# Patient Record
Sex: Female | Born: 1965 | Race: White | Hispanic: No | Marital: Married | Smoking: Never smoker
Health system: Southern US, Community
[De-identification: ages and names within clinical notes are randomized; demographics above are authoritative.]

## PROBLEM LIST (undated history)

## (undated) DIAGNOSIS — F419 Anxiety disorder, unspecified: Secondary | ICD-10-CM

## (undated) HISTORY — PX: OTHER SURGICAL HISTORY: SHX169

## (undated) HISTORY — DX: Anxiety disorder, unspecified: F41.9

---

## 1989-04-14 HISTORY — PX: KNEE ARTHROSCOPY: SUR90

## 2005-04-14 HISTORY — PX: TUBAL LIGATION: SHX77

## 2006-06-15 ENCOUNTER — Ambulatory Visit: Payer: Self-pay | Admitting: Obstetrics and Gynecology

## 2007-07-29 ENCOUNTER — Ambulatory Visit: Payer: Self-pay | Admitting: Obstetrics and Gynecology

## 2007-08-18 ENCOUNTER — Emergency Department: Payer: Self-pay | Admitting: Emergency Medicine

## 2008-08-01 ENCOUNTER — Ambulatory Visit: Payer: Self-pay | Admitting: Sports Medicine

## 2008-08-29 ENCOUNTER — Ambulatory Visit: Payer: Self-pay | Admitting: Obstetrics and Gynecology

## 2009-08-31 ENCOUNTER — Ambulatory Visit: Payer: Self-pay | Admitting: Obstetrics and Gynecology

## 2010-09-02 ENCOUNTER — Ambulatory Visit: Payer: Self-pay | Admitting: Obstetrics and Gynecology

## 2011-02-25 ENCOUNTER — Ambulatory Visit: Payer: Self-pay | Admitting: Internal Medicine

## 2011-07-07 DIAGNOSIS — J069 Acute upper respiratory infection, unspecified: Secondary | ICD-10-CM | POA: Insufficient documentation

## 2011-09-03 ENCOUNTER — Ambulatory Visit: Payer: Self-pay | Admitting: Obstetrics and Gynecology

## 2012-07-13 LAB — HM PAP SMEAR

## 2012-08-12 LAB — HM MAMMOGRAPHY

## 2012-09-03 ENCOUNTER — Ambulatory Visit: Payer: Self-pay | Admitting: Obstetrics and Gynecology

## 2013-02-14 ENCOUNTER — Encounter: Payer: Self-pay | Admitting: Adult Health

## 2013-02-14 ENCOUNTER — Ambulatory Visit (INDEPENDENT_AMBULATORY_CARE_PROVIDER_SITE_OTHER): Payer: BC Managed Care – PPO | Admitting: Adult Health

## 2013-02-14 VITALS — BP 122/76 | HR 84 | Temp 98.4°F | Resp 12 | Ht 63.5 in | Wt 138.0 lb

## 2013-02-14 DIAGNOSIS — R7989 Other specified abnormal findings of blood chemistry: Secondary | ICD-10-CM | POA: Insufficient documentation

## 2013-02-14 DIAGNOSIS — L719 Rosacea, unspecified: Secondary | ICD-10-CM | POA: Insufficient documentation

## 2013-02-14 DIAGNOSIS — Z0000 Encounter for general adult medical examination without abnormal findings: Secondary | ICD-10-CM

## 2013-02-14 DIAGNOSIS — Z23 Encounter for immunization: Secondary | ICD-10-CM

## 2013-02-14 DIAGNOSIS — R946 Abnormal results of thyroid function studies: Secondary | ICD-10-CM

## 2013-02-14 DIAGNOSIS — G479 Sleep disorder, unspecified: Secondary | ICD-10-CM | POA: Insufficient documentation

## 2013-02-14 MED ORDER — METRONIDAZOLE 0.75 % EX LOTN: TOPICAL_LOTION | CUTANEOUS | Status: DC

## 2013-02-14 NOTE — Assessment & Plan Note (Signed)
Recheck TSH. Check T3 and T4

## 2013-02-14 NOTE — Patient Instructions (Signed)
   Thank you for choosing [facility] for your health care needs.  Please have your labs drawn at your earliest convenience. You will need to be fasting for 8 hours.  The results will be available through MyChart for your convenience. Please remember to activate this. The activation code is located at the end of this form.  Start metronidazole lotion to your face 1-2 times daily for your rosacea symptoms.  You received your flu shot today.  For sleep, try melatonin 2-3 mg at bedtime. May increase dose gradually. This is sold over the counter.

## 2013-02-14 NOTE — Assessment & Plan Note (Signed)
Normal physical exam. Patient is up to date on mammography and Pap. She has these done with Dr. Logan Bores. Check labs: CBC with differential, lipids, comprehensive metabolic panel, TSH, T3, T4, vitamin D, B12

## 2013-02-14 NOTE — Assessment & Plan Note (Signed)
Metronidazole topical lotion 0.75%. Apply daily to twice a day to affected area.

## 2013-02-14 NOTE — Assessment & Plan Note (Signed)
Try melatonin 2-3 mg at bedtime. This is sold over-the-counter. Increased dose as needed to 5-6 mg at bedtime

## 2013-02-14 NOTE — Progress Notes (Signed)
Subjective:    Patient ID: Cynthia Ruiz, female    DOB: 08-13-1965, 47 y.o.   MRN: [id number]  HPI  Patient is a pleasant 47 y/o female who presents to clinic to establish. She was followed by Dr. Alison Murray who has moved out of the area. Patient reports that she is healthy. She reports that her last TSH was minimally elevated. They prescribed thyroid medication but did not tolerate this. She is not certain why additional testing was not done and she was just automatically started on medication. She would like this recheck. She is experiencing some difficulty sleeping. She is able to get to sleep but then wakes up around 3-4 am and cannot return to sleep. Tessa also reports some facial flushing after drinking wine or caffeine. Up to date on her mammogram (08/2012) which she reports was normal. Also UTD on PAP (07/2011) also normal. She is followed by Dr. Logan Bores for her GYN needs.   History reviewed. No pertinent past medical history.   Past Surgical History  Procedure Laterality Date  . Knee arthroscopy Right 1991  . Cesarean section  1991, 2002, 2007  . Pyloric stenosis surgery       Family History  Problem Relation Age of Onset  . Arthritis Mother 94    rheumatoid arthritis - died 18  . Hypertension Father   . Glaucoma Father   . Multiple sclerosis Sister   . Cancer Maternal Grandmother     bile duct  . Cancer Paternal Grandmother     unsure what type  . Diabetes Paternal Grandfather   . Down syndrome Daughter      History   Social History  . Marital Status: Married    Spouse Name: Sharion Grieves    Number of Children: 3  . Years of Education: 17   Occupational History  . Part time substitute teacher     [employer]   Social History Main Topics  . Smoking status: Never Smoker   . Smokeless tobacco: Never Used  . Alcohol Use: Yes     Comment: Occasionally   . Drug Use: No  . Sexual Activity: Yes    Partners: Male   Other Topics Concern  . Not  on file   Social History Narrative   Jeanann grew up in [place]. She attended [employer] in [place] and obtained her Bachelors degree in Sociology. 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 She currently lives in [place] with her husband, [name], and 2 of their 3 children. She has 1 son who is in the [employer] stationed in [place]. 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 Her main focus is her children. She enjoys going out with her husband and socializing with friends. She also does a lot of volunteer work in [employer]. 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     Review of Systems  Constitutional: Negative.   HENT: Negative.   Eyes: Negative.   Respiratory: Negative.   Cardiovascular: Negative.   Gastrointestinal: Negative.   Endocrine: Negative.   Genitourinary: Negative.   Musculoskeletal: Negative.   Skin:       Facial flushing after drinking wine or caffeine. Redness on her nose and cheeks.  Allergic/Immunologic: Negative.   Neurological: Negative.   Hematological: Negative.   Psychiatric/Behavioral: Positive for sleep disturbance. Negative for suicidal ideas, behavioral problems, confusion, self-injury, decreased concentration and agitation. The  patient is not nervous/anxious.        Objective:   Physical Exam  Constitutional: She is oriented to person, place, and time. She appears well-developed and well-nourished. No distress.  HENT:  Head: Normocephalic and atraumatic.  Right Ear: External ear normal.  Left Ear: External ear normal.  Nose: Nose normal.  Mouth/Throat: Oropharynx is clear and moist.  Eyes: Conjunctivae and EOM are normal. Pupils are equal, round, and reactive to light.  Neck: Normal range of motion. Neck supple. No tracheal deviation present. No thyromegaly present.  Cardiovascular: Normal rate, regular rhythm,  normal heart sounds and intact distal pulses.  Exam reveals no gallop and no friction rub.   No murmur heard. Pulmonary/Chest: Effort normal and breath sounds normal. No respiratory distress. She has no wheezes. She has no rales.  Abdominal: Soft. Bowel sounds are normal. She exhibits no distension and no mass. There is no tenderness. There is no rebound and no guarding.  Musculoskeletal: Normal range of motion. She exhibits no edema and no tenderness.  Lymphadenopathy:    She has no cervical adenopathy.  Neurological: She is alert and oriented to person, place, and time. She has normal reflexes. No cranial nerve deficit. Coordination normal.  Skin: Skin is warm and dry.  Psychiatric: She has a normal mood and affect. Her behavior is normal. Judgment and thought content normal.          Assessment & Plan:

## 2013-02-18 ENCOUNTER — Other Ambulatory Visit (INDEPENDENT_AMBULATORY_CARE_PROVIDER_SITE_OTHER): Payer: BC Managed Care – PPO

## 2013-02-18 DIAGNOSIS — R946 Abnormal results of thyroid function studies: Secondary | ICD-10-CM

## 2013-02-18 DIAGNOSIS — R7989 Other specified abnormal findings of blood chemistry: Secondary | ICD-10-CM

## 2013-02-18 DIAGNOSIS — Z0000 Encounter for general adult medical examination without abnormal findings: Secondary | ICD-10-CM

## 2013-02-18 LAB — LIPID PANEL
Cholesterol: 110 mg/dL (ref 0–200)
HDL: 45.5 mg/dL (ref >39.00)
LDL Cholesterol: 57 mg/dL (ref 0–99)
Total CHOL/HDL Ratio: 2
Triglycerides: 37 mg/dL (ref 0.0–149.0)

## 2013-02-18 LAB — COMPREHENSIVE METABOLIC PANEL
ALT: 12 U/L (ref 0–35)
CO2: 25 mEq/L (ref 19–32)
Calcium: 8.9 mg/dL (ref 8.4–10.5)
Chloride: 104 mEq/L (ref 96–112)
Creatinine, Ser: 0.8 mg/dL (ref 0.4–1.2)
GFR: 87.22 mL/min (ref >60.00)
Total Protein: 7.2 g/dL (ref 6.0–8.3)

## 2013-02-18 LAB — CBC WITH DIFFERENTIAL/PLATELET
Basophils Absolute: 0 10*3/uL (ref 0.0–0.1)
Eosinophils Absolute: 0 10*3/uL (ref 0.0–0.7)
Eosinophils Relative: 0.5 % (ref 0.0–5.0)
Hemoglobin: 12.5 g/dL (ref 12.0–15.0)
Lymphocytes Relative: 19.5 % (ref 12.0–46.0)
MCHC: 33.1 g/dL (ref 30.0–36.0)
Monocytes Absolute: 0.6 10*3/uL (ref 0.1–1.0)
Neutrophils Relative %: 70.7 % (ref 43.0–77.0)
Platelets: 229 10*3/uL (ref 150.0–400.0)
RBC: 4.25 Mil/uL (ref 3.87–5.11)
RDW: 12.8 % (ref 11.5–14.6)
WBC: 7.2 10*3/uL (ref 4.5–10.5)

## 2013-02-18 LAB — T4: T4, Total: 8.9 ug/dL (ref 5.0–12.5)

## 2013-02-18 LAB — T3: T3, Total: 145.1 ng/dL (ref 80.0–204.0)

## 2013-02-18 LAB — TSH: TSH: 3.82 u[IU]/mL (ref 0.35–5.50)

## 2013-02-19 LAB — VITAMIN D 25 HYDROXY (VIT D DEFICIENCY, FRACTURES): Vit D, 25-Hydroxy: 45 ng/mL (ref 30–89)

## 2013-02-21 ENCOUNTER — Encounter: Payer: Self-pay | Admitting: *Deleted

## 2013-06-06 ENCOUNTER — Ambulatory Visit (INDEPENDENT_AMBULATORY_CARE_PROVIDER_SITE_OTHER): Payer: BC Managed Care – PPO | Admitting: Internal Medicine

## 2013-06-06 ENCOUNTER — Encounter: Payer: Self-pay | Admitting: Internal Medicine

## 2013-06-06 VITALS — BP 122/76 | HR 89 | Temp 98.6°F | Wt 141.0 lb

## 2013-06-06 DIAGNOSIS — B9789 Other viral agents as the cause of diseases classified elsewhere: Secondary | ICD-10-CM

## 2013-06-06 DIAGNOSIS — J329 Chronic sinusitis, unspecified: Secondary | ICD-10-CM

## 2013-06-06 MED ORDER — MOMETASONE FUROATE 50 MCG/ACT NA SUSP: 2.0000 | Freq: Every day | NASAL | Status: DC

## 2013-06-06 NOTE — Progress Notes (Signed)
HPI  Pt presents to the clinic today with c/o sinus pressure and pain. She reports this started 2 days ago. She is not blowing anything out of her nose. She did have a cold for a week prior to this. She also c/o fatigue and body aches. She has tried Advil and essential oils without much relief. She has no history of allergies or asthma. She does not smoke. She has had sick contacts.  Review of Systems   History reviewed. No pertinent past medical history.  Family History  Problem Relation Age of Onset  . Arthritis Mother 6443    rheumatoid arthritis - died 963  . Hypertension Father   . Glaucoma Father   . Multiple sclerosis Sister   . Cancer Maternal Grandmother     bile duct  . Cancer Paternal Grandmother     unsure what type  . Diabetes Paternal Grandfather   . Down syndrome Daughter     History   Social History  . Marital Status: Married    Spouse Name: Marjo BickerDavid Acevedo    Number of Children: 3  . Years of Education: 17   Occupational History  . Part time substitute teacher     [employer]   Social History Main Topics  . Smoking status: Never Smoker   . Smokeless tobacco: Never Used  . Alcohol Use: Yes     Comment: Occasionally   . Drug Use: No  . Sexual Activity: Yes    Partners: Male   Other Topics Concern  . Not on file   Social History Narrative   [name] grew up in [place]. She attended [employer] in [place] and obtained her Bachelors degree in Sociology. 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 She currently lives in [place] with her husband, [name], and 2 of their 3 children. She has 1 son who is in the [employer] [place] in [place]. 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 Her main focus is her children. She enjoys going out with her husband and socializing with friends. She also does a lot of volunteer work  in [employer]. 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    No Known Allergies   Constitutional: Positive headache, fatigue. Denies fever or  abrupt weight changes.  HEENT:  Positive eye pain, pressure behind the eyes, facial pain, nasal congestion. Denies eye redness, ear pain, ringing in the ears, wax buildup, runny nose or bloody nose. Respiratory:  Denies cough, difficulty breathing or shortness of breath.  Cardiovascular: Denies chest pain, chest tightness, palpitations or swelling in the hands or feet.   No other specific complaints in a complete review of systems (except as listed in HPI above).  Objective:   BP 122/76  Pulse 89  Temp(Src) 98.6 F (37 C)  Wt 141 lb (63.957 kg)  SpO2 99%  General: Appears her stated age, well developed, well nourished in NAD. HEENT: Head: normal shape and size, no sinus tenderness noted; Eyes: sclera white, no icterus, conjunctiva pink, PERRLA and EOMs intact; Ears: Tm's gray and intact, normal light reflex, hole in TM on the left where tube recently fell out; Nose: mucosa pink and moist, septum midline; Throat/Mouth: + PND. Teeth present, mucosa pink and moist, no exudate noted, no lesions or ulcerations noted.  Neck: Neck supple, trachea midline. No massses, lumps or thyromegaly present.  Cardiovascular: Normal rate and rhythm. S1,S2  noted.  No murmur, rubs or gallops noted. No JVD or BLE edema. No carotid bruits noted. Pulmonary/Chest: Normal effort and positive vesicular breath sounds. No respiratory distress. No wheezes, rales or ronchi noted.      Assessment & Plan:   Acute viral sinusitis  Can use a Neti Pot which can be purchased from your local drug store. nasonex 2 sprays each nostril for 3 days and then as needed. Try allegra OTC x 1 week  RTC as needed or if symptoms persist.

## 2013-06-06 NOTE — Progress Notes (Signed)
Pre visit review using our clinic review tool, if applicable. No additional management support is needed unless otherwise documented below in the visit note. 

## 2013-06-06 NOTE — Patient Instructions (Addendum)

## 2013-08-02 ENCOUNTER — Encounter (INDEPENDENT_AMBULATORY_CARE_PROVIDER_SITE_OTHER): Payer: Self-pay

## 2013-08-02 ENCOUNTER — Ambulatory Visit (INDEPENDENT_AMBULATORY_CARE_PROVIDER_SITE_OTHER): Payer: BC Managed Care – PPO | Admitting: Adult Health

## 2013-08-02 ENCOUNTER — Encounter: Payer: Self-pay | Admitting: Adult Health

## 2013-08-02 VITALS — BP 116/68 | HR 87 | Temp 98.0°F | Resp 16 | Wt 143.8 lb

## 2013-08-02 DIAGNOSIS — J040 Acute laryngitis: Secondary | ICD-10-CM | POA: Insufficient documentation

## 2013-08-02 NOTE — Progress Notes (Signed)
Pre visit review using our clinic review tool, if applicable. No additional management support is needed unless otherwise documented below in the visit note. 

## 2013-08-02 NOTE — Progress Notes (Signed)
   Subjective:    Patient ID: Cynthia CoasterDeborah Wolpert, female    DOB: 12/26/1965, 48 y.o.   MRN: [id number]  HPI Patient is a healthy 48 year old female who presents to clinic with symptoms of losing her voice. She believes that her symptoms are associated with allergies. She uses essential oils to treat her allergies. Reports that she recently ran out of which she normally uses. She does not have a fever. She denies any general body discomfort. She does not have any sinus drainage.  Current Outpatient Prescriptions on File Prior to Visit  Medication Sig Dispense Refill  . Multiple Vitamin (MULTIVITAMIN) tablet Take 1 tablet by mouth daily.       No current facility-administered medications on file prior to visit.      Review of Systems  Constitutional: Negative.   HENT: Positive for voice change (laryngitis).   Eyes: Negative.   Respiratory: Negative.   Cardiovascular: Negative.   Gastrointestinal: Negative.   Endocrine: Negative.   Genitourinary: Negative.   Musculoskeletal: Negative.   Skin: Negative.   Allergic/Immunologic: Negative.   Neurological: Negative.   Hematological: Negative.   Psychiatric/Behavioral: Negative.        Objective:   Physical Exam  Constitutional: She is oriented to person, place, and time. No distress.  HENT:  Head: Normocephalic and atraumatic.  Right Ear: External ear normal.  Left Ear: External ear normal.  Mouth/Throat: Oropharynx is clear and moist. No oropharyngeal exudate.  Eyes: Conjunctivae and EOM are normal.  Neck: Normal range of motion. Neck supple.  Cardiovascular: Normal rate, regular rhythm, normal heart sounds and intact distal pulses.  Exam reveals no gallop and no friction rub.   No murmur heard. Pulmonary/Chest: Effort normal and breath sounds normal. No respiratory distress. She has no wheezes. She has no rales.  Musculoskeletal: Normal range of motion.  Lymphadenopathy:    She has no cervical adenopathy.  Neurological: She is  alert and oriented to person, place, and time. She has normal reflexes. Coordination normal.  Skin: Skin is warm and dry.  Psychiatric: She has a normal mood and affect. Her behavior is normal. Judgment and thought content normal.      Assessment & Plan:   1. Laryngitis Voice rest. Drink plenty of fluids. Honey with tea. If symptoms worsen or fail to improve she will need to see ENT.

## 2013-09-02 ENCOUNTER — Telehealth: Payer: Self-pay | Admitting: Adult Health

## 2013-09-02 NOTE — Telephone Encounter (Signed)
Pt called to inform that she has scheduled her routine mammogram for September 12, 2013 at 10:40 a.m. At [facility].

## 2013-09-02 NOTE — Telephone Encounter (Signed)
FYI: Patient has scheduled mammogram for September 12, 2013 at 10:40am at [facility].

## 2013-09-12 ENCOUNTER — Ambulatory Visit: Payer: Self-pay | Admitting: Adult Health

## 2013-09-12 LAB — HM MAMMOGRAPHY: HM Mammogram: NORMAL

## 2013-11-04 ENCOUNTER — Encounter: Payer: Self-pay | Admitting: Adult Health

## 2013-11-04 ENCOUNTER — Ambulatory Visit (INDEPENDENT_AMBULATORY_CARE_PROVIDER_SITE_OTHER): Payer: BC Managed Care – PPO | Admitting: Adult Health

## 2013-11-04 VITALS — BP 122/72 | HR 100 | Temp 98.3°F | Resp 14 | Ht 63.5 in | Wt 149.0 lb

## 2013-11-04 DIAGNOSIS — B001 Herpesviral vesicular dermatitis: Secondary | ICD-10-CM | POA: Insufficient documentation

## 2013-11-04 DIAGNOSIS — B009 Herpesviral infection, unspecified: Secondary | ICD-10-CM

## 2013-11-04 DIAGNOSIS — R350 Frequency of micturition: Secondary | ICD-10-CM

## 2013-11-04 LAB — POCT URINALYSIS DIPSTICK
BILIRUBIN UA: NEGATIVE
GLUCOSE UA: NEGATIVE
Ketones, UA: NEGATIVE
LEUKOCYTES UA: NEGATIVE
Nitrite, UA: POSITIVE
Spec Grav, UA: >=1.03
Urobilinogen, UA: 0.2
pH, UA: 6

## 2013-11-04 MED ORDER — VALACYCLOVIR HCL 1 G PO TABS: ORAL_TABLET | ORAL | Status: DC

## 2013-11-04 MED ORDER — CIPROFLOXACIN HCL 250 MG PO TABS: 250.0000 mg | ORAL_TABLET | Freq: Two times a day (BID) | ORAL | Status: DC

## 2013-11-04 NOTE — Progress Notes (Signed)
Pre visit review using our clinic review tool, if applicable. No additional management support is needed unless otherwise documented below in the visit note. 

## 2013-11-04 NOTE — Addendum Note (Signed)
Addended by: Novella OliveEY, RAQUEL M on: 11/04/2013 03:31 PM   Modules accepted: Orders

## 2013-11-04 NOTE — Patient Instructions (Signed)
  Start Cipro 250 mg twice a day for 3 days.  I am sending urine for culture and will notify you of results when available.  Drink plenty of water.   Call if no improvement within 3-4 days.  Take valtrex 2000 mg twice daily for 1 day for your fever blister.

## 2013-11-04 NOTE — Progress Notes (Signed)
Patient ID: Cynthia CoasterDeborah Ruiz, female   DOB: 12/10/1965, 48 y.o.   MRN: [id number]   Subjective:    Patient ID: Cynthia Coastereborah Ruiz, female    DOB: 12/10/1965, 48 y.o.   MRN: [id number]  HPI  Pt is a pleasant 48 y/o female who presents to clinic with frequency and urgency. Recently traveled to [place]. Reports beginning to have symptoms while visiting her son. She was taking AZO with some relief. No fever or chills. ?hematuria because recently had her period and not sure if she was spotting.  Fever blister on the bottom left side of lip. Fell asleep in the sun.  No past medical history on file.  Current Outpatient Prescriptions on File Prior to Visit  Medication Sig Dispense Refill  . Multiple Vitamin (MULTIVITAMIN) tablet Take 1 tablet by mouth daily.       No current facility-administered medications on file prior to visit.     Review of Systems  Constitutional: Negative for fever and chills.  HENT:       Fever blister lip  Genitourinary: Positive for dysuria, urgency, frequency and hematuria. Negative for flank pain, difficulty urinating and pelvic pain.  All other systems reviewed and are negative.      Objective:  BP 122/72  Pulse 100  Temp(Src) 98.3 F (36.8 C) (Oral)  Resp 14  Ht 5' 3.5" (1.613 m)  Wt 149 lb (67.586 kg)  BMI 25.98 kg/m2  SpO2 99%   Physical Exam  Constitutional: She is oriented to person, place, and time. No distress.  HENT:  Left lower lip herpes labialis  Cardiovascular: Normal rate and regular rhythm.   Pulmonary/Chest: Effort normal. No respiratory distress.  Musculoskeletal: Normal range of motion.  Neurological: She is alert and oriented to person, place, and time.  Skin: Skin is warm and dry.  Psychiatric: She has a normal mood and affect. Her behavior is normal. Judgment and thought content normal.      Assessment & Plan:   1. Urinary frequency UA positive for nitrites, blood. Send urine for culture. Start Cipro bid x 3 days. RTC if no  improvement. - POCT Urinalysis Dipstick  2. Herpes simplex labialis Valtrex 2000 mg bid x 1 day.

## 2013-11-06 LAB — URINE CULTURE
COLONY COUNT: NO GROWTH
Organism ID, Bacteria: NO GROWTH

## 2014-02-09 LAB — LIPID PANEL
Cholesterol: 124 mg/dL (ref 0–200)
HDL: 53 mg/dL (ref 35–70)
LDL CALC: 55 mg/dL
Triglycerides: 81 mg/dL (ref 40–160)

## 2014-02-09 LAB — BASIC METABOLIC PANEL
CREATININE: 0.9 mg/dL (ref <=1.1)
GLUCOSE: 88 mg/dL

## 2014-06-19 ENCOUNTER — Encounter: Payer: Self-pay | Admitting: Internal Medicine

## 2014-06-19 ENCOUNTER — Ambulatory Visit (INDEPENDENT_AMBULATORY_CARE_PROVIDER_SITE_OTHER): Payer: 59 | Admitting: Internal Medicine

## 2014-06-19 VITALS — BP 130/82 | HR 96 | Temp 98.9°F | Resp 16 | Ht 64.0 in | Wt 157.8 lb

## 2014-06-19 DIAGNOSIS — J020 Streptococcal pharyngitis: Secondary | ICD-10-CM | POA: Insufficient documentation

## 2014-06-19 DIAGNOSIS — Z1211 Encounter for screening for malignant neoplasm of colon: Secondary | ICD-10-CM

## 2014-06-19 DIAGNOSIS — Z800 Family history of malignant neoplasm of digestive organs: Secondary | ICD-10-CM

## 2014-06-19 LAB — POCT RAPID STREP A (OFFICE): Rapid Strep A Screen: POSITIVE — AB

## 2014-06-19 MED ORDER — AMOXICILLIN 500 MG PO CAPS: 500.0000 mg | ORAL_CAPSULE | Freq: Three times a day (TID) | ORAL | Status: DC

## 2014-06-19 NOTE — Progress Notes (Signed)
Pre-visit discussion using our clinic review tool. No additional management support is needed unless otherwise documented below in the visit note.  

## 2014-06-19 NOTE — Patient Instructions (Addendum)
i am treating you for strep throat with amoxicillin.  Please start it tonight ,  It is  500 mg three times daily with food to prevent nausea.  I also advise use of the following OTC meds to help with your other symptoms.   Take generic OTC benadryl 25 mg at bedtime for the drainage,  Sudafed PE up to 30 mg every 6 hours  if needed for congestion,   flush your sinuses twice daily with NeilMed sinus rinse  (do over the sink because if you do it right you will spit out globs of mucus)  Please take a probiotic ( Align, Floraque or Culturelle) OR A GENERIC EQUIVALENT for three weeks since you are taking an  antibiotic to prevent a very serious antibiotic associated infection  Called closttidium dificile colitis that can cause diarrhea, multi organ failure, sepsis and death if not managed.

## 2014-06-19 NOTE — Progress Notes (Signed)
Patient ID: Cynthia AuerbachDeborah M Ruiz, female   DOB: [birth year], 49 y.o.   MRN: [id number]   Patient Active Problem List   Diagnosis Date Noted  . Family history of colon cancer requiring screening colonoscopy 06/20/2014  . Strep pharyngitis 06/19/2014  . 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  . Laryngitis 08/02/2013  . Sleep disturbance 02/14/2013  . Elevated TSH 02/14/2013  . Rosacea 02/14/2013  . Routine general medical examination at a health care facility 02/14/2013    Subjective:  CC:   Chief Complaint  Patient presents with  . Cough    post nasal drip causing cough,   . Sore Throat    HPI:   Cynthia Ruiz is a 49 y.o. female who presents for   Sore throat.  Patient reports onset of feeling fatigued since Friday,  followed by sos nasal drip,  Sore throat, dry mout,  For the last few days,.    49 yr old was sick with fever last week but did not report sore throat,  Daughter has  Down's syndrome , however.  Patient denies objective fever but has had chills and still feels run down and sore throat,  No n/v/d   History reviewed. No pertinent past medical history.  Past Surgical History  Procedure Laterality Date  . Knee arthroscopy Right 1991  . Cesarean section  1991, 2002, 2007  . Pyloric stenosis surgery         The following portions of the patient's history were reviewed and updated as appropriate: Allergies, current medications, and problem list.    Review of Systems:   Patient denies headache, fevers, malaise, unintentional weight loss, skin rash, eye pain, sinus congestion and sinus pain, sore throat, dysphagia,  hemoptysis , cough, dyspnea, wheezing, chest pain, palpitations, orthopnea, edema, abdominal pain, nausea, melena, diarrhea, constipation, flank pain, dysuria, hematuria, urinary  Frequency, nocturia, numbness, tingling, seizures,  Focal weakness, Loss of consciousness,  Tremor, insomnia, depression, anxiety, and suicidal ideation.     History   Social  History  . Marital Status: Married    Spouse Name: Marjo BickerDavid Tyner  . Number of Children: 3  . Years of Education: 17   Occupational History  . Part time substitute teacher     [employer]   Social History Main Topics  . Smoking status: Never Smoker   . Smokeless tobacco: Never Used  . Alcohol Use: Yes     Comment: Occasionally   . Drug Use: No  . Sexual Activity:    Partners: Male   Other Topics Concern  . Not on file   Social History Narrative   Cynthia Ruiz grew up in [place]. She attended [employer] in [place] and obtained her Bachelors degree in Sociology. 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 She currently lives in [place] with her husband, [name], and 2 of their 3 children. She has 1 son who is in the [employer] stationed in [place]. 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 Her main focus is her children. She enjoys going out with her husband and socializing with friends. She also does a lot of volunteer work in [employer]. 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    Objective:  Filed Vitals:   06/19/14 1808  BP: 130/82  Pulse: 96  Temp: 98.9 F (37.2 C)  Resp: 16     General appearance: alert, cooperative and appears stated age  Ears: normal TM's and external ear canals both ears Throat: lips, mucosa, and tongue normal; tonsillar erythema, no ulcerations noted ,  teeth and gums normal Neck: cervical adenopathy, no carotid bruit, supple, symmetrical, trachea midline and thyroid not enlarged, symmetric, no tenderness/mass/nodules Back: symmetric, no curvature. ROM normal. No CVA tenderness. Lungs: clear to auscultation bilaterally Heart: regular rate and rhythm, S1, S2 normal, no murmur, click, rub or gallop Abdomen: soft, non-tender; bowel sounds normal; no masses,  no organomegaly Pulses: 2+ and  symmetric Skin: Skin color, texture, turgor normal. No rashes or lesions  Assessment and Plan:  Strep pharyngitis Amoxicillin tid x 7 days.  Supportive care outlined for other symptoms.  Contact precautions reviewed and rec'd having daughter tested    Family history of colon cancer requiring screening colonoscopy Father had multiple colon polyps, but no diagnosis of CA.  Patient was advised to start screening early but has never done screening until now and is requesting referral to [facility] clinic for colonoscopy.      Updated Medication List Outpatient Encounter Prescriptions as of 06/19/2014  Medication Sig  . amoxicillin (AMOXIL) 500 MG capsule Take 1 capsule (500 mg total) by mouth 3 (three) times daily.  . ciprofloxacin (CIPRO) 250 MG tablet Take 1 tablet (250 mg total) by mouth 2 (two) times daily. (Patient not taking: Reported on 06/19/2014)  . ibuprofen (ADVIL,MOTRIN) 200 MG tablet Take 400 mg by mouth every 6 (six) hours as needed.  . Multiple Vitamin (MULTIVITAMIN) tablet Take 1 tablet by mouth daily.  . phenylephrine (SUDAFED PE) 10 MG TABS tablet Take 10 mg by mouth every 6 (six) hours as needed.   . [DISCONTINUED] cetirizine (ZYRTEC) 10 MG tablet Take 10 mg by mouth daily.   . [DISCONTINUED] valACYclovir (VALTREX) 1000 MG tablet Take 2000 mg twice daily for one day. (Patient not taking: Reported on 06/19/2014)     Orders Placed This Encounter  Procedures  . Ambulatory referral to Gastroenterology  . POCT rapid strep A    No Follow-up on file.

## 2014-06-20 ENCOUNTER — Encounter: Payer: Self-pay | Admitting: Internal Medicine

## 2014-06-20 DIAGNOSIS — Z800 Family history of malignant neoplasm of digestive organs: Secondary | ICD-10-CM | POA: Insufficient documentation

## 2014-06-20 DIAGNOSIS — R195 Other fecal abnormalities: Secondary | ICD-10-CM | POA: Insufficient documentation

## 2014-06-20 NOTE — Assessment & Plan Note (Addendum)
Amoxicillin tid x 7 days.  Supportive care outlined for other symptoms.  Contact precautions reviewed and rec'd having daughter tested

## 2014-06-20 NOTE — Assessment & Plan Note (Signed)
Father had multiple colon polyps, but no diagnosis of CA.  Patient was advised to start screening early but has never done screening until now and is requesting [facility].

## 2014-07-18 ENCOUNTER — Encounter: Payer: Self-pay | Admitting: Nurse Practitioner

## 2014-07-18 ENCOUNTER — Ambulatory Visit (INDEPENDENT_AMBULATORY_CARE_PROVIDER_SITE_OTHER): Payer: 59 | Admitting: Nurse Practitioner

## 2014-07-18 ENCOUNTER — Ambulatory Visit: Payer: 59 | Admitting: Nurse Practitioner

## 2014-07-18 VITALS — BP 126/80 | HR 91 | Temp 97.4°F | Resp 12 | Ht 64.0 in | Wt 150.1 lb

## 2014-07-18 DIAGNOSIS — R319 Hematuria, unspecified: Secondary | ICD-10-CM

## 2014-07-18 DIAGNOSIS — N390 Urinary tract infection, site not specified: Secondary | ICD-10-CM | POA: Diagnosis not present

## 2014-07-18 LAB — POCT URINALYSIS DIPSTICK
Bilirubin, UA: NEGATIVE
Glucose, UA: NEGATIVE
Nitrite, UA: NEGATIVE
PROTEIN UA: 30
SPEC GRAV UA: 1.025
UROBILINOGEN UA: 0.2
pH, UA: 6

## 2014-07-18 MED ORDER — SULFAMETHOXAZOLE-TRIMETHOPRIM 800-160 MG PO TABS: 1.0000 | ORAL_TABLET | Freq: Two times a day (BID) | ORAL | Status: DC

## 2014-07-18 NOTE — Patient Instructions (Signed)
Please take a probiotic ( Align, Floraque or Culturelle) while you are on the antibiotic to prevent a serious antibiotic associated diarrhea  Called clostirudium dificile colitis and a vaginal yeast infection.   Take the antibiotic as directed.   Lots of water!

## 2014-07-18 NOTE — Progress Notes (Signed)
   Subjective:    Patient ID: Cynthia Ruiz, female    DOB: 1965/12/08, 49 y.o.   MRN: [id number]  HPI  Cynthia Ruiz is a 49 yo female with a CC of urinary frequency and trace blood, chills, Started yesterday.   Denies pain, burning and odor  No treatment to date  Review of Systems  Constitutional: Positive for chills.  Genitourinary: Positive for frequency, hematuria and difficulty urinating. Negative for dysuria and flank pain.       Feels she has to push out the urine       Objective:   Physical Exam  Constitutional: She is oriented to person, place, and time. She appears well-developed and well-nourished. No distress.  BP 126/80 mmHg  Pulse 91  Temp(Src) 97.4 F (36.3 C) (Oral)  Resp 12  Ht 5\' 4"  (1.626 m)  Wt 150 lb 1.9 oz (68.094 kg)  BMI 25.76 kg/m2  SpO2 99%  LMP 06/11/2013   HENT:  Head: Normocephalic and atraumatic.  Right Ear: External ear normal.  Left Ear: External ear normal.  Cardiovascular: Normal rate, regular rhythm and normal heart sounds.  Exam reveals no gallop and no friction rub.   No murmur heard. Pulmonary/Chest: Effort normal and breath sounds normal. No respiratory distress. She has no wheezes. She has no rales. She exhibits no tenderness.  Neurological: She is alert and oriented to person, place, and time. No cranial nerve deficit. She exhibits normal muscle tone. Coordination normal.  Skin: Skin is warm and dry. No rash noted. She is not diaphoretic.  Psychiatric: She has a normal mood and affect. Her behavior is normal. Judgment and thought content normal.         Assessment & Plan:

## 2014-07-18 NOTE — Progress Notes (Signed)
Pre visit review using our clinic review tool, if applicable. No additional management support is needed unless otherwise documented below in the visit note. 

## 2014-07-20 LAB — URINE CULTURE

## 2014-07-23 DIAGNOSIS — N390 Urinary tract infection, site not specified: Secondary | ICD-10-CM | POA: Insufficient documentation

## 2014-07-23 NOTE — Assessment & Plan Note (Signed)
POCT urine was suspicious for UTI will treat and culture. Bactrim DS twice daily for 5 days with a probiotic. FU prn worsening/failure to improve.

## 2014-09-07 ENCOUNTER — Other Ambulatory Visit: Payer: Self-pay | Admitting: Nurse Practitioner

## 2014-09-07 ENCOUNTER — Telehealth: Payer: Self-pay | Admitting: Nurse Practitioner

## 2014-09-07 DIAGNOSIS — Z1239 Encounter for other screening for malignant neoplasm of breast: Secondary | ICD-10-CM

## 2014-09-07 NOTE — Telephone Encounter (Signed)
Order placed for [facility]. Thanks!

## 2014-09-07 NOTE — Telephone Encounter (Signed)
Informed pt that order for mammogram was put in and she can call [facility] and schedule her appt.

## 2014-09-07 NOTE — Telephone Encounter (Signed)
Pt left msg request mammogram appt. No order in system.msn

## 2014-09-07 NOTE — Telephone Encounter (Signed)
Pt called and would like to have a mammogram and there is no order in the system. Please advise

## 2014-09-07 NOTE — Telephone Encounter (Signed)
Pt called and would like a mammogram, but there is no order in the system. Please advise.

## 2014-09-29 ENCOUNTER — Ambulatory Visit
Admission: RE | Admit: 2014-09-29 | Discharge: 2014-09-29 | Disposition: A | Discharge status: Home / Self Care | Payer: 59 | Source: Ambulatory Visit | Attending: Nurse Practitioner | Admitting: Nurse Practitioner

## 2014-09-29 DIAGNOSIS — Z1239 Encounter for other screening for malignant neoplasm of breast: Secondary | ICD-10-CM

## 2014-09-29 DIAGNOSIS — Z1231 Encounter for screening mammogram for malignant neoplasm of breast: Secondary | ICD-10-CM | POA: Diagnosis not present

## 2014-10-02 ENCOUNTER — Telehealth: Payer: Self-pay

## 2014-10-02 NOTE — Telephone Encounter (Signed)
The patient is hoping to get a referral to an ENT doctor regarding ongoing ear infections she is experiencing.  She states her insurance requires her to have an referral before she can make an appointment.

## 2014-10-03 NOTE — Telephone Encounter (Signed)
She will need a follow up visit to assess her ear infections and place referral.

## 2014-12-04 ENCOUNTER — Ambulatory Visit (INDEPENDENT_AMBULATORY_CARE_PROVIDER_SITE_OTHER): Payer: 59 | Admitting: Internal Medicine

## 2014-12-04 ENCOUNTER — Ambulatory Visit: Payer: 59 | Admitting: Internal Medicine

## 2014-12-04 ENCOUNTER — Encounter (INDEPENDENT_AMBULATORY_CARE_PROVIDER_SITE_OTHER): Payer: Self-pay

## 2014-12-04 VITALS — BP 117/78 | HR 96 | Temp 98.1°F | Resp 18 | Ht 63.39 in | Wt 153.5 lb

## 2014-12-04 DIAGNOSIS — M79645 Pain in left finger(s): Secondary | ICD-10-CM

## 2014-12-04 DIAGNOSIS — G8929 Other chronic pain: Secondary | ICD-10-CM

## 2014-12-04 DIAGNOSIS — M79672 Pain in left foot: Secondary | ICD-10-CM

## 2014-12-04 DIAGNOSIS — M659 Synovitis and tenosynovitis, unspecified: Secondary | ICD-10-CM | POA: Insufficient documentation

## 2014-12-04 DIAGNOSIS — M1810 Unilateral primary osteoarthritis of first carpometacarpal joint, unspecified hand: Secondary | ICD-10-CM

## 2014-12-04 LAB — POCT URINE PREGNANCY: Preg Test, Ur: NEGATIVE

## 2014-12-04 MED ORDER — MELOXICAM 15 MG PO TABS: 15.0000 mg | ORAL_TABLET | Freq: Every day | ORAL | Status: DC

## 2014-12-04 NOTE — Patient Instructions (Addendum)
Referral to [facility]  Dr Richardean Canal for recurrence of plantar fasciitis  Left thumb pain appears to be osteoarthritis of the thumb.  Plain films of the left thumb to evaluate the joint for signs of erosive arthritis ( X rays)   Trial of meloxicam 15 mg daily as you anti inflammatory  Can add tylenol up to 2000 mg daily but do not take advil or aleve  Rx for a padded velcro thumb sprint to wear  Except when showering   Referral to ortho if no improvement in 3 weeks

## 2014-12-04 NOTE — Progress Notes (Signed)
Subjective:  Patient ID: Cynthia Ruiz, female    DOB: 10/25/1965  Age: 49 y.o. MRN: [id number]  CC: The primary encounter diagnosis was Chronic pain of left thumb. 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  HPI Cynthia Ruiz presents for  Left thumb pain for 3 weeks , worse for the last few days,  Cannot open jars or floss her teeth.   No history of fall , but was very active this summer.   Ping pkeeps po  3 month history of right plantar foot pain aggravated by sitting or resting,  histoyr of plantar fasciitis in right foot. Saw Richardean Canal in the past and was put in a boot   Has been taking prn advil 2 tablets.    Sister is Cynthia Ruiz (MS) mother has RA)      History  She has past surgical history that includes Knee arthroscopy (Right, 1991); Cesarean section (1991, 2002, 2007); and Pyloric Stenosis surgery.   Her family history includes Arthritis (age of onset: 6) in her mother; Cancer in her father, maternal grandmother, and paternal grandmother; Diabetes in her paternal grandfather; Down syndrome in her daughter; Glaucoma in her father; Hypertension in her father; Multiple sclerosis in her sister.She reports that she has never smoked. She has never used smokeless tobacco. She reports that she drinks alcohol. She reports that she does not use illicit drugs.  Outpatient Prescriptions Prior to Visit  Medication Sig Dispense Refill  . cetirizine (ZYRTEC) 5 MG tablet Take 10 mg by mouth daily.    . Probiotic Product (PROBIOTIC DAILY PO) Take 1 tablet by mouth daily.    Marland Kitchen sulfamethoxazole-trimethoprim (BACTRIM DS,SEPTRA DS) 800-160 MG per tablet Take 1 tablet by mouth 2 (two) times daily. (Patient not taking: Reported on 12/04/2014) 10 tablet 0   No facility-administered medications prior to visit.    Review of Systems:  Patient denies  headache, fevers, malaise, unintentional weight loss, skin rash, eye pain, sinus congestion and sinus pain, sore throat, dysphagia,  hemoptysis , cough, dyspnea, wheezing, chest pain, palpitations, orthopnea, edema, abdominal pain, nausea, melena, diarrhea, constipation, flank pain, dysuria, hematuria, urinary  Frequency, nocturia, numbness, tingling, seizures,  Focal weakness, Loss of consciousness,  Tremor, insomnia, depression, anxiety, and suicidal ideation.     Objective:  BP 117/78 mmHg  Pulse 96  Temp(Src) 98.1 F (36.7 C) (Oral)  Resp 18  Ht 5' 3.39" (1.61 m)  Wt 153 lb 8 oz (69.627 kg)  BMI 26.86 kg/m2  SpO2 98%  LMP 11/03/2014 (Within Months)  Physical Exam:  General appearance: alert, cooperative and appears stated age Ears: normal TM's and external ear canals both ears Throat: lips, mucosa, and tongue normal; teeth and gums normal Neck: no adenopathy, no carotid bruit, supple, symmetrical, trachea midline and thyroid not enlarged, symmetric, no tenderness/mass/nodules Back: symmetric, no curvature. ROM normal. No CVA tenderness. Lungs: clear to auscultation bilaterally Heart: regular rate and rhythm, S1, S2 normal, no murmur, click, rub or gallop Abdomen: soft, non-tender; bowel sounds normal; no masses,  no organomegaly Pulses: 2+ and symmetric Skin: Skin color, texture, turgor normal. No rashes or lesions Lymph nodes: Cervical, supraclavicular, and axillary nodes normal.   Assessment & Plan:   Problem List Items Addressed This Visit      Unprioritized   Chronic pain of left thumb - Primary    Plain films were normal.  Will treat for sprain with NSAID and splint. If no improvement in 2 weeks will refer to Ortho.       Relevant Medications   meloxicam (MOBIC) 15 MG tablet   Other Relevant Orders   Sedimentation rate (Completed)   Comprehensive metabolic panel (Completed)   Rheumatoid factor   C-reactive protein (Completed)   Cyclic citrul peptide antibody,  IgG   ANA w/Reflex if Positive   DG Finger Thumb Left (Completed)   POCT urine pregnancy (Completed)   Chronic pain in left foot    Suggestive of plantar fasciitis , referral to Richardean Canal.       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 Mcpartland's sulfamethoxazole-trimethoprim. I am also having her maintain her cetirizine, Probiotic Product (PROBIOTIC DAILY PO), and meloxicam.  Meds ordered this encounter  Medications  . DISCONTD: meloxicam (MOBIC) 15 MG tablet    Sig: Take 1 tablet (15 mg total) by mouth daily.    Dispense:  30 tablet    Refill:  0  . meloxicam (MOBIC) 15 MG tablet    Sig: Take 1 tablet (15 mg total) by mouth daily.    Dispense:  90 tablet    Refill:  0    Medications Discontinued During This Encounter  Medication Reason  . sulfamethoxazole-trimethoprim (BACTRIM DS,SEPTRA DS) 800-160 MG per tablet   . meloxicam (MOBIC) 15 MG tablet Reorder    Follow-up: No Follow-up on file.   Sherlene Shams, MD

## 2014-12-05 ENCOUNTER — Encounter: Payer: Self-pay | Admitting: Internal Medicine

## 2014-12-05 ENCOUNTER — Ambulatory Visit
Admission: RE | Admit: 2014-12-05 | Discharge: 2014-12-05 | Disposition: A | Discharge status: Home / Self Care | Payer: 59 | Source: Ambulatory Visit | Attending: Internal Medicine | Admitting: Internal Medicine

## 2014-12-05 DIAGNOSIS — G8929 Other chronic pain: Secondary | ICD-10-CM | POA: Insufficient documentation

## 2014-12-05 DIAGNOSIS — M79645 Pain in left finger(s): Secondary | ICD-10-CM | POA: Insufficient documentation

## 2014-12-05 LAB — COMPREHENSIVE METABOLIC PANEL
ALBUMIN: 4.4 g/dL (ref 3.5–5.2)
ALK PHOS: 52 U/L (ref 39–117)
ALT: 11 U/L (ref 0–35)
AST: 15 U/L (ref 0–37)
BILIRUBIN TOTAL: 0.3 mg/dL (ref 0.2–1.2)
BUN: 18 mg/dL (ref 6–23)
CALCIUM: 9.5 mg/dL (ref 8.4–10.5)
CO2: 28 mEq/L (ref 19–32)
CREATININE: 0.85 mg/dL (ref 0.40–1.20)
Chloride: 103 mEq/L (ref 96–112)
GFR: 74.94 mL/min (ref >60.00)
Glucose, Bld: 92 mg/dL (ref 70–99)
Potassium: 4.6 mEq/L (ref 3.5–5.1)
Sodium: 138 mEq/L (ref 135–145)
TOTAL PROTEIN: 7.5 g/dL (ref 6.0–8.3)

## 2014-12-05 LAB — RHEUMATOID FACTOR

## 2014-12-05 LAB — SEDIMENTATION RATE: Sed Rate: 13 mm/hr (ref 0–22)

## 2014-12-05 LAB — C-REACTIVE PROTEIN: CRP: 0.2 mg/dL — AB (ref 0.5–20.0)

## 2014-12-05 NOTE — Assessment & Plan Note (Signed)
Plain films were normal.  Will treat for sprain with NSAID and splint. If no improvement in 2 weeks will refer to Ortho.

## 2014-12-05 NOTE — Assessment & Plan Note (Signed)
Suggestive of plantar fasciitis , referral to Richardean Canal.

## 2014-12-06 LAB — CYCLIC CITRUL PEPTIDE ANTIBODY, IGG: Cyclic Citrullin Peptide Ab: <2 U/mL (ref 0.0–5.0)

## 2014-12-06 LAB — ANA W/REFLEX IF POSITIVE: Anti Nuclear Antibody(ANA): NEGATIVE

## 2015-01-03 ENCOUNTER — Telehealth: Payer: Self-pay | Admitting: Internal Medicine

## 2015-01-03 DIAGNOSIS — M79646 Pain in unspecified finger(s): Secondary | ICD-10-CM

## 2015-01-03 NOTE — Telephone Encounter (Signed)
Wanting a referral order put in for left thumb pain with Dr. Zachery Dauer.

## 2015-01-04 NOTE — Telephone Encounter (Signed)
Needs referral for thumb pain.

## 2015-01-04 NOTE — Telephone Encounter (Signed)
Referral is in process as requested to Dr Zachery Dauer

## 2015-01-04 NOTE — Telephone Encounter (Signed)
Patient notified

## 2015-01-05 ENCOUNTER — Ambulatory Visit: Payer: 59 | Admitting: Internal Medicine

## 2015-02-14 ENCOUNTER — Encounter: Payer: Self-pay | Admitting: Obstetrics and Gynecology

## 2015-02-14 ENCOUNTER — Encounter: Payer: Self-pay | Admitting: *Deleted

## 2015-02-20 ENCOUNTER — Encounter: Payer: Self-pay | Admitting: Obstetrics and Gynecology

## 2015-02-20 ENCOUNTER — Ambulatory Visit (INDEPENDENT_AMBULATORY_CARE_PROVIDER_SITE_OTHER): Payer: 59 | Admitting: Obstetrics and Gynecology

## 2015-02-20 VITALS — BP 118/77 | HR 90 | Ht 63.0 in | Wt 158.8 lb

## 2015-02-20 DIAGNOSIS — Z01419 Encounter for gynecological examination (general) (routine) without abnormal findings: Secondary | ICD-10-CM | POA: Diagnosis not present

## 2015-02-20 MED ORDER — INFLUENZA VAC SPLIT QUAD 0.5 ML IM SUSY
0.5000 mL | PREFILLED_SYRINGE | Freq: Once | INTRAMUSCULAR | Status: AC
  Administered 2015-02-20: 0.5 mL via INTRAMUSCULAR

## 2015-02-20 NOTE — Progress Notes (Signed)
  Subjective:     Cynthia AuerbachDeborah M Ruiz is a 49 y.o. female and is here for a comprehensive physical exam. The patient reports no problems.  Social History   Social History  . Marital Status: Married    Spouse Name: Cynthia BickerDavid Ruiz  . Number of Children: 3  . Years of Education: 17   Occupational History  . Part time substitute teacher     [employer]   Social History Main Topics  . Smoking status: Never Smoker   . Smokeless tobacco: Never Used  . Alcohol Use: Yes     Comment: Occasionally   . Drug Use: No  . Sexual Activity:    Partners: Male    Birth Control/ Protection: Surgical   Other Topics Concern  . Not on file   Social History Narrative   [place] grew up in [place]. She attended [employer] in [place] and obtained her Bachelors degree in Sociology. 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 She currently lives in [place] with her husband, [place] HuaDavid, and 2 of their 3 children. She has 1 son who is in the [employer] [place]. 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 Her main focus is her children. She enjoys going out with her husband and socializing with friends. She also does a lot of volunteer work in [employer]. 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   Health Maintenance  Topic Date Due  . Hepatitis C Screening  1965/12/25  . HIV Screening  12/05/1978  . TETANUS/TDAP  12/05/1982  . COLONOSCOPY  12/04/2013  . INFLUENZA VACCINE  11/13/2014  . PAP SMEAR  07/30/2015  . MAMMOGRAM  09/28/2016    The following portions of the patient's history were reviewed and updated as appropriate: allergies, current medications, past family history, past medical history, past social history, past surgical history and problem list.  Review of Systems A comprehensive review of systems was negative except for:  Genitourinary: positive for irregular menses last few months   Objective:    General appearance: alert, cooperative and appears stated age Neck: no adenopathy, no carotid bruit, no JVD, supple, symmetrical, trachea midline and thyroid not enlarged, symmetric, no tenderness/mass/nodules Lungs: clear to auscultation bilaterally Breasts: normal appearance, no masses or tenderness Heart: regular rate and rhythm, S1, S2 normal, no murmur, click, rub or gallop Abdomen: soft, non-tender; bowel sounds normal; no masses,  no organomegaly Pelvic: cervix normal in appearance, external genitalia normal, no adnexal masses or tenderness, no cervical motion tenderness, rectovaginal septum normal, uterus normal size, shape, and consistency and vagina normal without discharge    Assessment:    Healthy female exam. Perimenopausal, desires Flu vaccine      Plan:  Pap not indicated, Flu vaccine given; routine screening labs obtained    See After Visit Summary for Counseling Recommendations

## 2015-02-20 NOTE — Patient Instructions (Addendum)
Place annual gynecologic exam patient instructions here.  Thank you for enrolling in MyChart. Please follow the instructions below to securely access your online medical record. MyChart allows you to send messages to your doctor, view your test results, manage appointments, and more.   How Do I Sign Up? 1. In your Internet browser, go to [employer] and enter [email or web address]. 2. Click on the Sign Up Now link in the Sign In box. You will see the New Member Sign Up page. 3. Enter your MyChart Access Code exactly as it appears below. You will not need to use this code after you've completed the sign-up process. If you do not sign up before the expiration date, you must request a new code.  MyChart Access Code: [email or web address] Expires: 04/20/2015  2:29 PM  4. Enter your Social Security Number ([id number]) and Date of Birth (mm/dd/yyyy) as indicated and click Submit. You will be taken to the next sign-up page. 5. Create a MyChart ID. This will be your MyChart login ID and cannot be changed, so think of one that is secure and easy to remember. 6. Create a MyChart password. You can change your password at any time. 7. Enter your Password Reset Question and Answer. This can be used at a later time if you forget your password.  8. Enter your e-mail address. You will receive e-mail notification when new information is available in MyChart. 9. Click Sign Up. You can now view your medical record.   Additional Information Remember, MyChart is NOT to be used for urgent needs. For medical emergencies, dial 911.   Influenza Virus Vaccine (Flucelvax) What is this medicine? INFLUENZA VIRUS VACCINE (in floo EN zuh VAHY ruhs vak SEEN) helps to reduce the risk of getting influenza also known as the flu. The vaccine only helps protect you against some strains of the flu. This medicine may be used for other purposes; ask your health care provider or pharmacist if you have questions. What  should I tell my health care provider before I take this medicine? They need to know if you have any of these conditions: -bleeding disorder like hemophilia -fever or infection -Guillain-Barre syndrome or other neurological problems -immune system problems -infection with the human immunodeficiency virus (HIV) or AIDS -low blood platelet counts -multiple sclerosis -an unusual or allergic reaction to influenza virus vaccine, other medicines, foods, dyes or preservatives -pregnant or trying to get pregnant -breast-feeding How should I use this medicine? This vaccine is for injection into a muscle. It is given by a health care professional. A copy of Vaccine Information Statements will be given before each vaccination. Read this sheet carefully each time. The sheet may change frequently. Talk to your pediatrician regarding the use of this medicine in children. Special care may be needed. Overdosage: If you think you've taken too much of this medicine contact a poison control center or emergency room at once. Overdosage: If you think you have taken too much of this medicine contact a poison control center or emergency room at once. NOTE: This medicine is only for you. Do not share this medicine with others. What if I miss a dose? This does not apply. What may interact with this medicine? -chemotherapy or radiation therapy -medicines that lower your immune system like etanercept, anakinra, infliximab, and adalimumab -medicines that treat or prevent blood clots like warfarin -phenytoin -steroid medicines like prednisone or cortisone -theophylline -vaccines This list may not describe all possible interactions. Give your health care provider a list of  all the medicines, herbs, non-prescription drugs, or dietary supplements you use. Also tell them if you smoke, drink alcohol, or use illegal drugs. Some items may interact with your medicine. What should I watch for while using this  medicine? Report any side effects that do not go away within 3 days to your doctor or health care professional. Call your health care provider if any unusual symptoms occur within 6 weeks of receiving this vaccine. You may still catch the flu, but the illness is not usually as bad. You cannot get the flu from the vaccine. The vaccine will not protect against colds or other illnesses that may cause fever. The vaccine is needed every year. What side effects may I notice from receiving this medicine? Side effects that you should report to your doctor or health care professional as soon as possible: -allergic reactions like skin rash, itching or hives, swelling of the face, lips, or tongue Side effects that usually do not require medical attention (Report these to your doctor or health care professional if they continue or are bothersome.): -fever -headache -muscle aches and pains -pain, tenderness, redness, or swelling at the injection site -tiredness This list may not describe all possible side effects. Call your doctor for medical advice about side effects. You may report side effects to FDA at 1-800-FDA-1088. Where should I keep my medicine? The vaccine will be given by a health care professional in a clinic, pharmacy, doctor's office, or other health care setting. You will not be given vaccine doses to store at home. NOTE: This sheet is a summary. It may not cover all possible information. If you have questions about this medicine, talk to your doctor, pharmacist, or health care provider.    2016, Elsevier/Gold Standard. (2011-03-12 14:06:47)

## 2015-02-21 LAB — CBC
Hematocrit: 39.8 % (ref 34.0–46.6)
Hemoglobin: 13.3 g/dL (ref 11.1–15.9)
MCH: 30 pg (ref 26.6–33.0)
MCHC: 33.4 g/dL (ref 31.5–35.7)
MCV: 90 fL (ref 79–97)
Platelets: 330 x10E3/uL (ref 150–379)
RBC: 4.43 x10E6/uL (ref 3.77–5.28)
RDW: 13.5 % (ref 12.3–15.4)
WBC: 6.6 x10E3/uL (ref 3.4–10.8)

## 2015-02-21 LAB — COMPREHENSIVE METABOLIC PANEL WITH GFR
ALT: 13 IU/L (ref 0–32)
AST: 20 IU/L (ref 0–40)
Albumin/Globulin Ratio: 1.4 (ref 1.1–2.5)
Albumin: 4.3 g/dL (ref 3.5–5.5)
Alkaline Phosphatase: 67 IU/L (ref 39–117)
BUN/Creatinine Ratio: 15 (ref 9–23)
BUN: 12 mg/dL (ref 6–24)
Bilirubin Total: 0.3 mg/dL (ref 0.0–1.2)
CO2: 23 mmol/L (ref 18–29)
Calcium: 9.1 mg/dL (ref 8.7–10.2)
Chloride: 99 mmol/L (ref 97–106)
Creatinine, Ser: 0.78 mg/dL (ref 0.57–1.00)
GFR calc Af Amer: 102 mL/min/1.73
GFR calc non Af Amer: 88 mL/min/1.73
Globulin, Total: 3.1 g/dL (ref 1.5–4.5)
Glucose: 79 mg/dL (ref 65–99)
Potassium: 4.2 mmol/L (ref 3.5–5.2)
Sodium: 136 mmol/L (ref 136–144)
Total Protein: 7.4 g/dL (ref 6.0–8.5)

## 2015-02-21 LAB — LIPID PANEL
CHOL/HDL RATIO: 2.1 ratio (ref 0.0–4.4)
Cholesterol, Total: 128 mg/dL (ref 100–199)
HDL: 60 mg/dL (ref >39)
LDL CALC: 54 mg/dL (ref 0–99)
Triglycerides: 70 mg/dL (ref 0–149)
VLDL CHOLESTEROL CAL: 14 mg/dL (ref 5–40)

## 2015-02-21 LAB — VITAMIN D 25 HYDROXY (VIT D DEFICIENCY, FRACTURES): Vit D, 25-Hydroxy: 37.6 ng/mL (ref 30.0–100.0)

## 2015-02-23 ENCOUNTER — Telehealth: Payer: Self-pay | Admitting: *Deleted

## 2015-02-23 NOTE — Telephone Encounter (Signed)
-----   Message from Ulyses AmorMelody N Burr, [place] sent at 02/21/2015  5:39 PM EST ----- Please let her know all labs werenormal

## 2015-02-23 NOTE — Telephone Encounter (Signed)
Notified pt of lab results 

## 2015-04-17 ENCOUNTER — Ambulatory Visit (INDEPENDENT_AMBULATORY_CARE_PROVIDER_SITE_OTHER): Payer: BLUE CROSS/BLUE SHIELD | Admitting: Family Medicine

## 2015-04-17 VITALS — BP 116/72 | HR 102 | Temp 98.7°F | Ht 63.0 in | Wt 158.6 lb

## 2015-04-17 DIAGNOSIS — B349 Viral infection, unspecified: Secondary | ICD-10-CM | POA: Diagnosis not present

## 2015-04-17 MED ORDER — ONDANSETRON 4 MG PO TBDP
4.0000 mg | ORAL_TABLET | Freq: Three times a day (TID) | ORAL | Status: DC | PRN
Start: 2015-04-17 — End: 2015-05-21

## 2015-04-17 NOTE — Progress Notes (Signed)
Subjective:  Patient ID: [place] Ru[place], female    DOB: 1965-04-18  Age: 50 y.o. MRN: [id number]  CC: Nausea, Diarrhea; Cough, congestion  HPI:  50 year old female presents for an acute visit with the above complaints.  Nausea, Diarrhea  Patient reports her symptoms began on Friday.  She reports associated decreased PO intake,  Her diarrhea improved as of yesterday but recurred again today.  She denies associated fever, chills.  No recent out of country travel, camping trip, etc. No recent new foods/changes.  She reports associated abdominal cramping.  No known exacerbating or relieving factors.  Cough, congestion  Cough and congestion began Sunday.  She reports associated chills and body aches. No fever.  No known exacerbating or relieving factors.  Social Hx   Social History   Social History  . Marital Status: Married    Spouse Name: [place]  . Number of Children: 3  . Years of Education: 17   Occupational History  . Part time substitute teacher     [employer]   Social History Main Topics  . Smoking status: Never Smoker   . Smokeless tobacco: Never Used  . Alcohol Use: Yes     Comment: Occasionally   . Drug Use: No  . Sexual Activity:    Partners: Male    Birth Control/ Protection: Surgical   Other Topics Concern  . Not on file   Social History Narrative   [place] grew up in [place]. She attended [employer] in [place] and obtained her Bachelors degree in Sociology. 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 She currently lives in [place] with her husband, [place], and 2 of their 3 children. She has 1 son who is in the [employer] stationed in [place]. 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 Her main focus is her children. She enjoys going out with her husband and socializing with friends. She  also does a lot of volunteer work in [employer]. 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   Review of Systems  Constitutional: Positive for chills. Negative for fever.  HENT: Positive for congestion.   Respiratory: Positive for cough.   Gastrointestinal: Positive for nausea and diarrhea. Negative for vomiting.  Musculoskeletal: Positive for myalgias.   Objective:  BP 116/72 mmHg  Pulse 102  Temp(Src) 98.7 F (37.1 C) (Oral)  Ht 5\' 3"  (1.6 m)  Wt 158 lb 9.6 oz (71.94 kg)  BMI 28.10 kg/m2  SpO2 98%  BP/Weight 04/17/2015 02/20/2015 12/04/2014  Systolic BP 116 118 117  Diastolic BP 72 77 78  Wt. (Lbs) 158.6 158.8 153.5  BMI 28.1 28.14 26.86    Physical Exam  Constitutional: She is oriented to person, place, and time. She appears well-developed. No distress.  HENT:  Head: Normocephalic and atraumatic.  Right Ear: External ear normal.  Left Ear: External ear normal.  Mouth/Throat: Oropharynx is clear and moist.  Left TM - large perforation noted. No erythema, effusion.  Right TM - scarring noted. No erythema, effusion.  Cardiovascular: Regular rhythm.  Tachycardia present.   Pulmonary/Chest: Effort normal and breath sounds normal.  Abdominal: Soft. She exhibits no distension. There is no tenderness. There is no rebound and no guarding.  Lymphadenopathy:    She has no cervical adenopathy.  Neurological: She is alert and oriented to person, place, and time.  Psychiatric: She has a normal mood  and affect.  Vitals reviewed.   Lab Results  Component Value Date   WBC 6.6 02/20/2015   HGB 12.5 02/18/2013   HCT 39.8 02/20/2015   PLT 229.0 02/18/2013   GLUCOSE 79 02/20/2015   CHOL 128 02/20/2015   TRIG 70 02/20/2015   HDL 60 02/20/2015   LDLCALC 54 02/20/2015   ALT 13 02/20/2015   AST 20 02/20/2015   NA 136 02/20/2015   K 4.2 02/20/2015   CL 99 02/20/2015   CREATININE 0.78 02/20/2015   BUN 12 02/20/2015   CO2 23 02/20/2015   TSH 3.82 02/18/2013   Assessment &  Plan:   Problem List Items Addressed This Visit    Viral illness - Primary    New problem. Exam benign and reassuring today. This appears to be a viral illness. Treating nausea with Zofran. Encouraged aggressive fluid intake as she is clinically dehydrated.         Meds ordered this encounter  Medications  . ondansetron (ZOFRAN ODT) 4 MG disintegrating tablet    Sig: Take 1 tablet (4 mg total) by mouth every 8 (eight) hours as needed for nausea or vomiting.    Dispense:  20 tablet    Refill:  0    Follow-up: Return if symptoms worsen or fail to improve.  [place] OtherJayce Kannen Moxey DO [facility] [place]

## 2015-04-17 NOTE — Assessment & Plan Note (Signed)
New problem. Exam benign and reassuring today. This appears to be a viral illness. Treating nausea with Zofran. Encouraged aggressive fluid intake as she is clinically dehydrated.

## 2015-04-17 NOTE — Progress Notes (Signed)
Pre visit review using our clinic review tool, if applicable. No additional management support is needed unless otherwise documented below in the visit note. 

## 2015-04-17 NOTE — Patient Instructions (Signed)
It was nice to see you today.  This is viral in origin and will slowly improve.  Use the Zofran as directed to help with your nausea (and diarrhea).  Be sure to stay hydrated.  Follow up:  Return if symptoms worsen or fail to improve.  Take care  Dr. Adriana Simasook

## 2015-05-21 ENCOUNTER — Encounter: Payer: Self-pay | Admitting: Internal Medicine

## 2015-05-21 ENCOUNTER — Ambulatory Visit (INDEPENDENT_AMBULATORY_CARE_PROVIDER_SITE_OTHER): Payer: BLUE CROSS/BLUE SHIELD | Admitting: Internal Medicine

## 2015-05-21 VITALS — BP 118/76 | HR 80 | Temp 97.8°F | Resp 12 | Ht 63.0 in | Wt 161.8 lb

## 2015-05-21 DIAGNOSIS — E663 Overweight: Secondary | ICD-10-CM

## 2015-05-21 DIAGNOSIS — Z800 Family history of malignant neoplasm of digestive organs: Secondary | ICD-10-CM | POA: Diagnosis not present

## 2015-05-21 DIAGNOSIS — N951 Menopausal and female climacteric states: Secondary | ICD-10-CM | POA: Insufficient documentation

## 2015-05-21 DIAGNOSIS — R300 Dysuria: Secondary | ICD-10-CM

## 2015-05-21 LAB — POCT URINALYSIS DIPSTICK
BILIRUBIN UA: NEGATIVE
Glucose, UA: NEGATIVE
Nitrite, UA: NEGATIVE
PROTEIN UA: NEGATIVE
SPEC GRAV UA: 1.015
Urobilinogen, UA: 0.2
pH, UA: 7

## 2015-05-21 NOTE — Assessment & Plan Note (Signed)
Referral to [facility]

## 2015-05-21 NOTE — Assessment & Plan Note (Signed)
I have addressed  BMI and recommended wt loss of 10% of body weight over the next 6 months using a low glycemic index diet and regular exercise a minimum of 5 days per week.   

## 2015-05-21 NOTE — Progress Notes (Signed)
Pre-visit discussion using our clinic review tool. No additional management support is needed unless otherwise documented below in the visit note.  

## 2015-05-21 NOTE — Progress Notes (Signed)
Subjective:  Patient ID: Cynthia Ruiz, female    DOB: May 13, 1965  Age: 50 y.o. MRN: [id number]  CC: The primary encounter diagnosis was Dysuria. Diagnoses of Perimenopause, Family history of colon cancer requiring screening colonoscopy, and Overweight were also pertinent to this visit.  HPI Cynthia Ruiz presents for evaluation of suprapubic pressure and polyuria for the last 5 days. no real dysuria , but noted scant blood with woping after urinating today. .  Has occurred in the past typically 2 days after having coitus with husband.    Was treated on Jan 3 by Dr Adriana Simas for viral URI.  Last UTI was in April 2016.     menopause symptoms. has not had period since October.  Tubal ligation .  duaghter at home is 72 [address] [employer]   Stressed out,  Has gained weight.  Started a walking program 2 weeks ago or has done exercise.  Has reduced cookie eating to 1 daily  (One thin mint)  And reduced bread intake.  No weight loss yet.   Needs referral to Cynthia Ruiz,  For colonoscopy  Sees [facility] monthly for low back pain .  During a  Recent  manipulation with left hip fully flexed, she felt a pop and severe pain under her left breast .  No rib pain on exam today    Outpatient Prescriptions Prior to Visit  Medication Sig Dispense Refill  . cetirizine (ZYRTEC) 5 MG tablet Take 10 mg by mouth daily.    . Probiotic Product (PROBIOTIC DAILY PO) Take 1 tablet by mouth daily. Reported on 05/21/2015    . meloxicam (MOBIC) 15 MG tablet Take 1 tablet (15 mg total) by mouth daily. (Patient not taking: Reported on 04/17/2015) 90 tablet 0  . ondansetron (ZOFRAN ODT) 4 MG disintegrating tablet Take 1 tablet (4 mg total) by mouth every 8 (eight) hours as needed for nausea or vomiting. (Patient not taking: Reported on 05/21/2015) 20 tablet 0   No facility-administered medications prior to visit.    Review of Systems;  Patient denies headache, fevers, malaise, unintentional weight loss, skin rash, eye pain, sinus  congestion and sinus pain, sore throat, dysphagia,  hemoptysis , cough, dyspnea, wheezing, chest pain, palpitations, orthopnea, edema, abdominal pain, nausea, melena, diarrhea, constipation, flank pain, dysuria, hematuria, urinary  Frequency, nocturia, numbness, tingling, seizures,  Focal weakness, Loss of consciousness,  Tremor, insomnia, depression, anxiety, and suicidal ideation.      Objective:  BP 118/76 mmHg  Pulse 80  Temp(Src) 97.8 F (36.6 C) (Oral)  Resp 12  Ht  (1.6 m)  Wt 161 lb 12 oz (73.369 kg)  BMI 28.66 kg/m2  SpO2 99%  LMP 01/13/2015 (Approximate)  BP Readings from Last 3 Encounters:  05/21/15 118/76  04/17/15 116/72  02/20/15 118/77    Wt Readings from Last 3 Encounters:  05/21/15 161 lb 12 oz (73.369 kg)  04/17/15 158 lb 9.6 oz (71.94 kg)  02/20/15 158 lb 12.8 oz (72.031 kg)    General appearance: alert, cooperative and appears stated age Ears: normal TM's and external ear canals both ears Throat: lips, mucosa, and tongue normal; teeth and gums normal Neck: no adenopathy, no carotid bruit, supple, symmetrical, trachea midline and thyroid not enlarged, symmetric, no tenderness/mass/nodules Back: symmetric, no curvature. ROM normal. No CVA tenderness. Lungs: clear to auscultation bilaterally Heart: regular rate and rhythm, S1, S2 normal, no murmur, click, rub or gallop Abdomen: soft, non-tender; bowel sounds normal; no masses,  no organomegaly. No bruising  or rib pain  Pulses: 2+ and symmetric Skin: Skin color, texture, turgor normal. No rashes or lesions Lymph nodes: Cervical, supraclavicular, and axillary nodes normal.  No results found for: HGBA1C  Lab Results  Component Value Date   CREATININE 0.78 02/20/2015   CREATININE 0.85 12/04/2014   CREATININE 0.9 02/09/2014    Lab Results  Component Value Date   WBC 6.6 02/20/2015   HGB 12.5 02/18/2013   HCT 39.8 02/20/2015   PLT 330 02/20/2015   GLUCOSE 79 02/20/2015   CHOL 128 02/20/2015    TRIG 70 02/20/2015   HDL 60 02/20/2015   LDLCALC 54 02/20/2015   ALT 13 02/20/2015   AST 20 02/20/2015   NA 136 02/20/2015   K 4.2 02/20/2015   CL 99 02/20/2015   CREATININE 0.78 02/20/2015   BUN 12 02/20/2015   CO2 23 02/20/2015   TSH 3.82 02/18/2013    Dg Finger Thumb Left  12/05/2014  CLINICAL DATA:  Left thumb pain for the past 3 weeks, worse for the past 3 days. No known injury. EXAM: LEFT THUMB 2+V COMPARISON:  None. FINDINGS: There is no evidence of fracture or dislocation. There is no evidence of arthropathy or other focal bone abnormality. Soft tissues are unremarkable IMPRESSION: Normal examination. Electronically Signed   By: Beckie Salts M.D.   On: 12/05/2014 15:41    Assessment & Plan:   Problem List Items Addressed This Visit    Family history of colon cancer requiring screening colonoscopy    Referral to Cynthia Ruiz       Relevant Orders   Ambulatory referral to General Surgery   Dysuria - Primary    Mild, occurring after intercourse.  Patient's [place] is mildly abnomal.  Micro and culture pending. She was givne rx for cipro to start if sympotm are  worsening while awaiting results.       Relevant Orders   POCT Urinalysis Dipstick (Completed)   Urinalysis, Routine w reflex microscopic   Urine culture   Perimenopause   Overweight    I have addressed  BMI and recommended wt loss of 10% of body weight over the next 6 months using a low glycemic index diet and regular exercise a minimum of 5 days per week.         A total of 25 minutes of face to face time was spent with patient more than half of which was spent in counselling about the above mentioned conditions  and coordination of care   I have discontinued Ms. Tricarico's meloxicam and ondansetron. I am also having her maintain her cetirizine and Probiotic Product (PROBIOTIC DAILY PO).  No orders of the defined types were placed in this encounter.    Medications Discontinued During This Encounter  Medication  Reason  . meloxicam (MOBIC) 15 MG tablet   . ondansetron (ZOFRAN ODT) 4 MG disintegrating tablet     Follow-up: No Follow-up on file.   Cynthia Shams, MD

## 2015-05-21 NOTE — Patient Instructions (Addendum)
I am giving you a prescription for ciprofloxacin 250 mg twice daily for 5 days  You do not need to start right away if you want to wait for the culture  The OTC medicine AZO treats burning., but does not cure infection     Your rib pain appears to be from a muscle strain.  You can use 800 mg ibuprofen and 500 mg tylenol every 8 hours  Ice area for 15 minutes   All day long .  After 3 days, try heat

## 2015-05-21 NOTE — Assessment & Plan Note (Signed)
Mild, occurring after intercourse.  Patient's [place] is mildly abnomal.  Micro and culture pending. She was givne rx for cipro to start if sympotm are  worsening while awaiting results.

## 2015-05-22 ENCOUNTER — Encounter: Payer: Self-pay | Admitting: *Deleted

## 2015-05-22 LAB — URINALYSIS, ROUTINE W REFLEX MICROSCOPIC
Bilirubin Urine: NEGATIVE
HGB URINE DIPSTICK: NEGATIVE
Ketones, ur: NEGATIVE
Nitrite: NEGATIVE
PH: 7 (ref 5.0–8.0)
SPECIFIC GRAVITY, URINE: 1.015 (ref 1.000–1.030)
TOTAL PROTEIN, URINE-UPE24: NEGATIVE
Urine Glucose: NEGATIVE
Urobilinogen, UA: 0.2 (ref 0.0–1.0)

## 2015-05-25 LAB — URINE CULTURE

## 2015-05-26 ENCOUNTER — Encounter: Payer: Self-pay | Admitting: Internal Medicine

## 2015-06-06 ENCOUNTER — Encounter: Payer: Self-pay | Admitting: General Surgery

## 2015-06-06 ENCOUNTER — Ambulatory Visit (INDEPENDENT_AMBULATORY_CARE_PROVIDER_SITE_OTHER): Payer: BLUE CROSS/BLUE SHIELD | Admitting: General Surgery

## 2015-06-06 VITALS — BP 130/64 | HR 76 | Resp 12 | Ht 63.0 in | Wt 168.0 lb

## 2015-06-06 DIAGNOSIS — Z1211 Encounter for screening for malignant neoplasm of colon: Secondary | ICD-10-CM | POA: Diagnosis not present

## 2015-06-06 MED ORDER — POLYETHYLENE GLYCOL 3350 17 GM/SCOOP PO POWD: ORAL | Status: DC

## 2015-06-06 NOTE — Progress Notes (Signed)
Patient ID: Cynthia Ruiz, female   DOB: 07/24/1965, 50 y.o.   MRN: [id number]  Chief Complaint  Patient presents with  . Colonoscopy    HPI AARINI Ruiz is a 50 y.o. female.  Here today to discuss having a colonoscopy, none prior. Denies any gastrointestinal issues. Bowels are regular and daily, no bleeding. The patient's father had polyps beginning at age 48-65. No first degree relative with colon cancer.  HPI  No past medical history on file.  Past Surgical History  Procedure Laterality Date  . Knee arthroscopy Right 1991  . Pyloric stenosis surgery    . Tubal ligation  2007  . Cesarean section  1991, 2002, 2007    Family History  Problem Relation Age of Onset  . Arthritis Mother 22    rheumatoid arthritis - died 21  . Hypertension Father   . Glaucoma Father   . Cancer Father 58    multiple colon polyps,  not CA   . Multiple sclerosis Sister 30  . Cancer Maternal Grandmother     bile duct  . Cancer Paternal Grandmother     unsure what type  . Diabetes Paternal Grandfather   . Down syndrome Daughter     Social History Social History  Substance Use Topics  . Smoking status: Never Smoker   . Smokeless tobacco: Never Used  . Alcohol Use: Yes     Comment: Occasionally     No Known Allergies  Current Outpatient Prescriptions  Medication Sig Dispense Refill  . cetirizine (ZYRTEC) 5 MG tablet Take 10 mg by mouth daily.    . Nutritional Supplements (JUICE PLUS FIBRE PO) Take 2 tablets by mouth daily.    . Probiotic Product (PROBIOTIC DAILY PO) Take 1 tablet by mouth daily. Reported on 05/21/2015    . polyethylene glycol powder (GLYCOLAX/MIRALAX) powder 255 grams one bottle for colonoscopy prep 255 g 0   No current facility-administered medications for this visit.    Review of Systems Review of Systems  Constitutional: Negative.   Respiratory: Negative.   Cardiovascular: Negative.   Gastrointestinal: Negative.     Blood pressure 130/64, pulse 76, resp.  rate 12, height  (1.6 m), weight 168 lb (76.204 kg), last menstrual period 01/13/2015.  Physical Exam Physical Exam  Constitutional: She is oriented to person, place, and time. She appears well-developed and well-nourished.  HENT:  Mouth/Throat: Oropharynx is clear and moist.  Eyes: Conjunctivae are normal. No scleral icterus.  Neck: Neck supple.  Cardiovascular: Normal rate, regular rhythm and normal heart sounds.   Pulmonary/Chest: Effort normal and breath sounds normal.  Abdominal: Soft. Normal appearance. There is no tenderness.  Lymphadenopathy:    She has no cervical adenopathy.  Neurological: She is alert and oriented to person, place, and time.  Skin: Skin is warm and dry.  Psychiatric: Her behavior is normal.    Data Reviewed PCP notes of February 2017  Assessment    Candidate for screening colonoscopy.    Plan        Colonoscopy with possible biopsy/polypectomy prn: Information regarding the procedure, including its potential risks and complications (including but not limited to perforation of the bowel, which may require emergency surgery to repair, and bleeding) was verbally given to the patient. Educational information regarding lower intestinal endoscopy was given to the patient. Written instructions for how to complete the bowel prep using Miralax were provided. The importance of drinking ample fluids to avoid dehydration as a result of the prep emphasized.  Patient has been scheduled for a colonoscopy on 07-18-15 at [facility].  PCP:  Duncan Dull  This information has been scribed by Dorathy Daft RNBC.  Earline Mayotte 06/07/2015, 10:12 AM

## 2015-06-06 NOTE — Patient Instructions (Addendum)
Colonoscopy A colonoscopy is an exam to look at the entire large intestine (colon). This exam can help find problems such as tumors, polyps, inflammation, and areas of bleeding. The exam takes about 1 hour.  LET YOUR HEALTH CARE PROVIDER KNOW ABOUT:   Any allergies you have.  All medicines you are taking, including vitamins, herbs, eye drops, creams, and over-the-counter medicines.  Previous problems you or members of your family have had with the use of anesthetics.  Any blood disorders you have.  Previous surgeries you have had.  Medical conditions you have. RISKS AND COMPLICATIONS  Generally, this is a safe procedure. However, as with any procedure, complications can occur. Possible complications include:  Bleeding.  Tearing or rupture of the colon wall.  Reaction to medicines given during the exam.  Infection (rare). BEFORE THE PROCEDURE   Ask your health care provider about changing or stopping your regular medicines.  You may be prescribed an oral bowel prep. This involves drinking a large amount of medicated liquid, starting the day before your procedure. The liquid will cause you to have multiple loose stools until your stool is almost clear or light green. This cleans out your colon in preparation for the procedure.  Do not eat or drink anything else once you have started the bowel prep, unless your health care provider tells you it is safe to do so.  Arrange for someone to drive you home after the procedure. PROCEDURE   You will be given medicine to help you relax (sedative).  You will lie on your side with your knees bent.  A long, flexible tube with a light and camera on the end (colonoscope) will be inserted through the rectum and into the colon. The camera sends video back to a computer screen as it moves through the colon. The colonoscope also releases carbon dioxide gas to inflate the colon. This helps your health care provider see the area better.  During  the exam, your health care provider may take a small tissue sample (biopsy) to be examined under a microscope if any abnormalities are found.  The exam is finished when the entire colon has been viewed. AFTER THE PROCEDURE   Do not drive for 24 hours after the exam.  You may have a small amount of blood in your stool.  You may pass moderate amounts of gas and have mild abdominal cramping or bloating. This is caused by the gas used to inflate your colon during the exam.  Ask when your test results will be ready and how you will get your results. Make sure you get your test results.   This information is not intended to replace advice given to you by your health care provider. Make sure you discuss any questions you have with your health care provider.   Document Released: 03/28/2000 Document Revised: 01/19/2013 Document Reviewed: 12/06/2012 Elsevier Interactive Patient Education 2016 Elsevier Inc.  Patient has been scheduled for a colonoscopy on 07-18-15 at [facility].  

## 2015-06-07 ENCOUNTER — Encounter: Payer: Self-pay | Admitting: General Surgery

## 2015-06-07 DIAGNOSIS — Z1211 Encounter for screening for malignant neoplasm of colon: Secondary | ICD-10-CM | POA: Insufficient documentation

## 2015-07-11 ENCOUNTER — Telehealth: Payer: Self-pay | Admitting: *Deleted

## 2015-07-11 NOTE — Telephone Encounter (Signed)
Message for patient to call the office.   Patient is scheduled for a colonoscopy on 07-18-15 at [facility]. We need to make sure patient has not had a change in medications since last office visit. Also, verify patient has picked up Miralax prescription.

## 2015-07-11 NOTE — Telephone Encounter (Signed)
Patient called the office back and medications were reviewed with her. This patient states she is no longer taking the probiotic. Medication list updated accordingly.   She reports she has Miralax prescription.  We will proceed with colonoscopy as scheduled.   This patient was instructed to call the office should she have further questions.

## 2015-07-18 ENCOUNTER — Encounter
Admission: RE | Disposition: A | Discharge status: Home / Self Care | Payer: Self-pay | Source: Ambulatory Visit | Attending: General Surgery

## 2015-07-18 ENCOUNTER — Encounter: Payer: Self-pay | Admitting: General Surgery

## 2015-07-18 ENCOUNTER — Ambulatory Visit
Admission: RE | Admit: 2015-07-18 | Discharge: 2015-07-18 | Disposition: A | Discharge status: Home / Self Care | Payer: BLUE CROSS/BLUE SHIELD | Source: Ambulatory Visit | Attending: General Surgery | Admitting: General Surgery

## 2015-07-18 ENCOUNTER — Ambulatory Visit: Payer: BLUE CROSS/BLUE SHIELD | Admitting: Anesthesiology

## 2015-07-18 DIAGNOSIS — Z1211 Encounter for screening for malignant neoplasm of colon: Secondary | ICD-10-CM | POA: Diagnosis not present

## 2015-07-18 DIAGNOSIS — Z9889 Other specified postprocedural states: Secondary | ICD-10-CM | POA: Diagnosis not present

## 2015-07-18 DIAGNOSIS — Z79899 Other long term (current) drug therapy: Secondary | ICD-10-CM | POA: Insufficient documentation

## 2015-07-18 DIAGNOSIS — Z9851 Tubal ligation status: Secondary | ICD-10-CM | POA: Diagnosis not present

## 2015-07-18 HISTORY — PX: COLONOSCOPY WITH PROPOFOL: SHX5780

## 2015-07-18 SURGERY — COLONOSCOPY WITH PROPOFOL
Anesthesia: General

## 2015-07-18 MED ORDER — SODIUM CHLORIDE 0.9 % IV SOLN
INTRAVENOUS | Status: DC
Start: 2015-07-18 — End: 2015-07-18
  Administered 2015-07-18: 10:00:00 via INTRAVENOUS

## 2015-07-18 MED ORDER — LIDOCAINE HCL (CARDIAC) 20 MG/ML IV SOLN
INTRAVENOUS | Status: DC | PRN
  Administered 2015-07-18: 30 mg via INTRAVENOUS

## 2015-07-18 MED ORDER — PROPOFOL 10 MG/ML IV BOLUS
INTRAVENOUS | Status: DC | PRN
  Administered 2015-07-18 (×2): 50 mg via INTRAVENOUS

## 2015-07-18 MED ORDER — MIDAZOLAM HCL 5 MG/5ML IJ SOLN
INTRAMUSCULAR | Status: DC | PRN
  Administered 2015-07-18: 1 mg via INTRAVENOUS

## 2015-07-18 MED ORDER — PHENYLEPHRINE HCL 10 MG/ML IJ SOLN
INTRAMUSCULAR | Status: DC | PRN
  Administered 2015-07-18: 100 ug via INTRAVENOUS

## 2015-07-18 MED ORDER — FENTANYL CITRATE (PF) 100 MCG/2ML IJ SOLN
INTRAMUSCULAR | Status: DC | PRN
  Administered 2015-07-18: 50 ug via INTRAVENOUS

## 2015-07-18 MED ORDER — PROPOFOL 500 MG/50ML IV EMUL
INTRAVENOUS | Status: DC | PRN
  Administered 2015-07-18: 160 ug/kg/min via INTRAVENOUS

## 2015-07-18 NOTE — Transfer of Care (Signed)
Immediate Anesthesia Transfer of Care Note  Patient: Cynthia AuerbachDeborah M Ruiz  Procedure(s) Performed: Procedure(s): COLONOSCOPY WITH PROPOFOL (N/A)  Patient Location: PACU and Short Stay  Anesthesia Type:General  Level of Consciousness: awake and patient cooperative  Airway & Oxygen Therapy: Patient Spontanous Breathing and Patient connected to nasal cannula oxygen  Post-op Assessment: Report given to RN and Post -op Vital signs reviewed and stable  Post vital signs: Reviewed and stable  Last Vitals:  Filed Vitals:   07/18/15 0925  BP: 128/84  Pulse: 84  Temp: 36.9 C  Resp: 19    Complications: No apparent anesthesia complications

## 2015-07-18 NOTE — Anesthesia Postprocedure Evaluation (Signed)
Anesthesia Post Note  Patient: Cynthia AuerbachDeborah M Ruiz  Procedure(s) Performed: Procedure(s) (LRB): COLONOSCOPY WITH PROPOFOL (N/A)  Patient location during evaluation: PACU Anesthesia Type: General Level of consciousness: awake Pain management: pain level controlled Vital Signs Assessment: post-procedure vital signs reviewed and stable Respiratory status: spontaneous breathing Cardiovascular status: blood pressure returned to baseline Anesthetic complications: no    Last Vitals:  Filed Vitals:   07/18/15 1039 07/18/15 1040  BP: 82/39 81/46  Pulse: 79 81  Temp: 35.8 C   Resp: 14 13    Last Pain: There were no vitals filed for this visit.               VAN STAVEREN,Sharnee Douglass

## 2015-07-18 NOTE — Op Note (Signed)
[facility] Patient Name: Cynthia CoasterDeborah Ruiz Procedure Date: 07/18/2015 10:07 AM MRN: [id number] Account #: [id number] Date of Birth: Sep 02, 1965 Admit Type: Outpatient Age: 50 Room: [facility] ENDO ROOM 1 Gender: Female Note Status: Finalized Procedure:            Colonoscopy Indications:          Screening for colorectal malignant neoplasm Providers:            Earline MayotteJeffrey W. Sharde Gover, MD Referring MD:         Duncan Dulleresa Tullo, MD (Referring MD) Medicines:            Monitored Anesthesia Care Complications:        No immediate complications. Procedure:            Pre-Anesthesia Assessment:                       - Prior to the procedure, a History and Physical was                        performed, and patient medications, allergies and                        sensitivities were reviewed. The patient's tolerance of                        previous anesthesia was reviewed.                       - The risks and benefits of the procedure and the                        sedation options and risks were discussed with the                        patient. All questions were answered and informed                        consent was obtained.                       After obtaining informed consent, the colonoscope was                        passed under direct vision. Throughout the procedure,                        the patient's blood pressure, pulse, and oxygen                        saturations were monitored continuously. The                        Colonoscope was introduced through the anus and                        advanced to the the cecum, identified by appendiceal                        orifice and ileocecal valve. The colonoscopy was  performed without difficulty. The patient tolerated the                        procedure well. The quality of the bowel preparation                        was excellent. Findings:      The entire examined colon appeared  normal on direct and retroflexion       views. Impression:           - The entire examined colon is normal on direct and                        retroflexion views.                       - No specimens collected. Recommendation:       - Repeat colonoscopy in 10 years for screening purposes. Procedure Code(s):    --- Professional ---                       [phone], Colonoscopy, flexible; diagnostic, including                        collection of specimen(s) by brushing or washing, when                        performed (separate procedure) Diagnosis Code(s):    --- Professional ---                       Z12.11, Encounter for screening for malignant neoplasm                        of colon CPT copyright 2016 American Medical Association. All rights reserved. The codes documented in this report are preliminary and upon coder review may  be revised to meet current compliance requirements. Earline Mayotte, MD 07/18/2015 10:35:08 AM This report has been signed electronically. Number of Addenda: 0 Note Initiated On: 07/18/2015 10:07 AM Scope Withdrawal Time: 0 hours 6 minutes 53 seconds  Total Procedure Duration: 0 hours 18 minutes 39 seconds       [facility]

## 2015-07-18 NOTE — H&P (Signed)
Cynthia AuerbachDeborah M Ruiz [id number] 02-27-1966     HPI: No change from February exam. For screening exam.    Prescriptions prior to admission  Medication Sig Dispense Refill Last Dose  . polyethylene glycol powder (GLYCOLAX/MIRALAX) powder 255 grams one bottle for colonoscopy prep 255 g 0 07/17/2015 at 1300  . cetirizine (ZYRTEC) 5 MG tablet Take 10 mg by mouth daily.   Taking  . Nutritional Supplements (JUICE PLUS FIBRE PO) Take 2 tablets by mouth daily.      No Known Allergies No past medical history on file. Past Surgical History  Procedure Laterality Date  . Knee arthroscopy Right 1991  . Pyloric stenosis surgery    . Tubal ligation  2007  . Cesarean section  1991, 2002, 2007   Social History   Social History  . Marital Status: Married    Spouse Name: Cynthia Ruiz  . Number of Children: 3  . Years of Education: 17   Occupational History  . Part time substitute teacher     [employer]   Social History Main Topics  . Smoking status: Never Smoker   . Smokeless tobacco: Never Used  . Alcohol Use: Yes     Comment: Occasionally   . Drug Use: No  . Sexual Activity:    Partners: Male    Birth Control/ Protection: Surgical   Other Topics Concern  . Not on file   Social History Narrative   Cynthia PoundDeborah grew up in [place]. She attended [employer] in [place] and obtained her Bachelors degree in Sociology. 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 She currently lives in [place] with her husband, Cynthia Ruiz, and 2 of their 3 children. She has 1 son who is in the [employer] [place]. 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 Her main focus is her children. She enjoys going out with her husband and socializing with friends. She also does a lot of volunteer work in [employer] [place]. 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   Social  History   Social History Narrative   Cynthia PoundDeborah grew up in [place]. She attended [employer] in [place] and obtained her Bachelors degree in Sociology. 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 She currently lives in [place] with her husband, Cynthia Ruiz, and 2 of their 3 children. She has 1 son who is in the [employer] [place]. 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 Her main focus is her children. She enjoys going out with her husband and socializing with friends. She also does a lot of volunteer work in [employer] [place]. 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     ROS: Negative.     PE: HEENT: Negative. Lungs: Clear. Cardio: RR. Cynthia Ruiz, Cynthia Ruiz 07/18/2015   Assessment/Plan:  Proceed with planned endoscopy.

## 2015-07-18 NOTE — Anesthesia Preprocedure Evaluation (Signed)
Anesthesia Evaluation  Patient identified by MRN, date of birth, ID band Patient awake    Reviewed: Allergy & Precautions, NPO status , Patient's Chart, lab work & pertinent test results  Airway Mallampati: I       Dental no notable dental hx.    Pulmonary neg pulmonary ROS,    Pulmonary exam normal        Cardiovascular negative cardio ROS   Rhythm:Regular     Neuro/Psych negative neurological ROS     GI/Hepatic negative GI ROS, Neg liver ROS,   Endo/Other  negative endocrine ROS  Renal/GU negative Renal ROS  negative genitourinary   Musculoskeletal negative musculoskeletal ROS (+)   Abdominal Normal abdominal exam  (+)   Peds negative pediatric ROS (+)  Hematology negative hematology ROS (+)   Anesthesia Other Findings   Reproductive/Obstetrics                             Anesthesia Physical Anesthesia Plan  ASA: I  Anesthesia Plan: General   Post-op Pain Management:    Induction: Intravenous  Airway Management Planned: Natural Airway and Nasal Cannula  Additional Equipment:   Intra-op Plan:   Post-operative Plan:   Informed Consent: I have reviewed the patients History and Physical, chart, labs and discussed the procedure including the risks, benefits and alternatives for the proposed anesthesia with the patient or authorized representative who has indicated his/her understanding and acceptance.     Plan Discussed with: CRNA  Anesthesia Plan Comments:         Anesthesia Quick Evaluation

## 2015-08-02 ENCOUNTER — Other Ambulatory Visit: Payer: Self-pay | Admitting: Internal Medicine

## 2015-08-02 DIAGNOSIS — Z1231 Encounter for screening mammogram for malignant neoplasm of breast: Secondary | ICD-10-CM

## 2015-10-01 ENCOUNTER — Ambulatory Visit
Admission: RE | Admit: 2015-10-01 | Discharge: 2015-10-01 | Disposition: A | Discharge status: Home / Self Care | Payer: BLUE CROSS/BLUE SHIELD | Source: Ambulatory Visit | Attending: Internal Medicine | Admitting: Internal Medicine

## 2015-10-01 ENCOUNTER — Other Ambulatory Visit: Payer: Self-pay | Admitting: Internal Medicine

## 2015-10-01 DIAGNOSIS — Z1231 Encounter for screening mammogram for malignant neoplasm of breast: Secondary | ICD-10-CM | POA: Diagnosis present

## 2016-02-06 ENCOUNTER — Telehealth: Payer: Self-pay | Admitting: Internal Medicine

## 2016-02-06 NOTE — Telephone Encounter (Signed)
Patient scheduled 02/11/16 , FYI

## 2016-02-06 NOTE — Telephone Encounter (Signed)
Pt called and stated that she needs a pre employment physical. She needs labs for proof of vaccines, Tb, Tdap, flu shot. Pt is to start her new job on 11/13. There is no available appts. Please advise as to where to schedule. Thank you!   11/1 - will available after 9:30. 11/8 no good - orientation for job, possibly free in the afterrnoon 11/9 not available from 8-9:30  Call pt @ [phone]

## 2016-02-11 ENCOUNTER — Telehealth: Payer: Self-pay

## 2016-02-11 ENCOUNTER — Encounter: Payer: Self-pay | Admitting: Internal Medicine

## 2016-02-11 ENCOUNTER — Ambulatory Visit (INDEPENDENT_AMBULATORY_CARE_PROVIDER_SITE_OTHER): Payer: BLUE CROSS/BLUE SHIELD | Admitting: Internal Medicine

## 2016-02-11 VITALS — BP 140/100 | HR 89 | Wt 161.4 lb

## 2016-02-11 DIAGNOSIS — Z0000 Encounter for general adult medical examination without abnormal findings: Secondary | ICD-10-CM | POA: Diagnosis not present

## 2016-02-11 DIAGNOSIS — R030 Elevated blood-pressure reading, without diagnosis of hypertension: Secondary | ICD-10-CM

## 2016-02-11 DIAGNOSIS — F411 Generalized anxiety disorder: Secondary | ICD-10-CM | POA: Diagnosis not present

## 2016-02-11 DIAGNOSIS — R0789 Other chest pain: Secondary | ICD-10-CM | POA: Diagnosis not present

## 2016-02-11 DIAGNOSIS — R079 Chest pain, unspecified: Secondary | ICD-10-CM | POA: Diagnosis not present

## 2016-02-11 DIAGNOSIS — Z23 Encounter for immunization: Secondary | ICD-10-CM | POA: Diagnosis not present

## 2016-02-11 DIAGNOSIS — R7301 Impaired fasting glucose: Secondary | ICD-10-CM

## 2016-02-11 LAB — COMPREHENSIVE METABOLIC PANEL
ALT: 16 U/L (ref 0–35)
AST: 22 U/L (ref 0–37)
Albumin: 4.5 g/dL (ref 3.5–5.2)
Alkaline Phosphatase: 73 U/L (ref 39–117)
BILIRUBIN TOTAL: 0.4 mg/dL (ref 0.2–1.2)
BUN: 12 mg/dL (ref 6–23)
CALCIUM: 9.3 mg/dL (ref 8.4–10.5)
CHLORIDE: 104 meq/L (ref 96–112)
CO2: 28 meq/L (ref 19–32)
CREATININE: 0.84 mg/dL (ref 0.40–1.20)
GFR: 75.62 mL/min (ref >60.00)
GLUCOSE: 84 mg/dL (ref 70–99)
Potassium: 3.9 mEq/L (ref 3.5–5.1)
Sodium: 139 mEq/L (ref 135–145)
Total Protein: 8 g/dL (ref 6.0–8.3)

## 2016-02-11 LAB — TSH: TSH: 3.26 u[IU]/mL (ref 0.35–4.50)

## 2016-02-11 LAB — MICROALBUMIN / CREATININE URINE RATIO
Creatinine,U: 100 mg/dL
MICROALB/CREAT RATIO: 0.7 mg/g (ref 0.0–30.0)
Microalb, Ur: <0.7 mg/dL (ref 0.0–1.9)

## 2016-02-11 LAB — HEMOGLOBIN A1C: Hgb A1c MFr Bld: 5.4 % (ref 4.6–6.5)

## 2016-02-11 MED ORDER — ALPRAZOLAM 0.25 MG PO TABS: 0.2500 mg | ORAL_TABLET | Freq: Every evening | ORAL | 0 refills | Status: DC | PRN

## 2016-02-11 NOTE — Progress Notes (Signed)
Patient ID: Cynthia Ruiz, female    DOB: [birth year]  Age: 50 y.o. MRN: [id number]  The patient is here for   Non gyn work physical examination and management of other chronic and acute problems.  She is needing proof of vaccination against:  VARICELLA MMR QUANTIFERON GOLD FOR TB TES  TDaP DUE  RECEIVED FLU SHOT HERE IN OFFICE Today  SHE Is FASTING TODAY  HAS APPT ON NOV 8 WITH MELODY BURR  MAMMOGRAM WAS DONE IN June     The risk factors are reflected in the social history.  The roster of all physicians providing medical care to patient - is listed in the Snapshot section of the chart.  Home safety : The patient has smoke detectors in the home. They wear seatbelts.  There are no firearms at home. There is no violence in the home.   There is no risks for hepatitis, STDs or HIV. There is no   history of blood transfusion. They have no travel history to infectious disease endemic areas of the world.  The patient has seen their dentist in the last six month. They have seen their eye doctor in the last year.    Discussed the need for sun protection: hats, long sleeves and use of sunscreen if there is significant sun exposure.   Diet: the importance of a healthy diet is discussed. They do have a healthy diet.  The benefits of regular aerobic exercise were discussed. She walks 4 times per week ,  20 minutes.   Depression screen: there are no signs or vegative symptoms of depression- irritability, change in appetite, anhedonia, sadness/tearfullness.   The following portions of the patient's history were reviewed and updated as appropriate: allergies, current medications, past family history, past medical history,  past surgical history, past social history  and problem list.  Visual acuity was not assessed per patient preference since she has regular follow up with her ophthalmologist. Hearing and body mass index were assessed and reviewed.   During the course of the visit the  patient was educated and counseled about appropriate screening and preventive services including : fall prevention , diabetes screening, nutrition counseling, colorectal cancer screening, and recommended immunizations.    CC: The primary encounter diagnosis was Chest pain, unspecified type. Diagnoses of Elevated blood pressure reading without diagnosis of hypertension, Routine general medical examination at a health care facility, Impaired fasting glucose, GAD (generalized anxiety disorder), and Other chest pain were also pertinent to this visit.  Chest pain. OCCURS WHEN EMOTIONALLY STRESSED.  LEFT SIDED CHEST TIGHTNESS.  Doesn't feel like pressure,  But also doesn't feel like muscle spasm.     STRESSORS INCLUDE FINANCIAL HARDSHIP CAUSED BY HUSBAND'S FINANCIAL IRRESPONSIBILITY. Husband is now working 2 JOBS,  [place] TO WORK FULL TIME.  DOWNS SYNDROME CHILD.   Not sleeping well at night,  Husband working 3rd shift, had to adjust to being in bed by herslef,  Tired but cannot go to sleep,  Wakes up 4:30   History [facility] has no past medical history on file.   She has a past surgical history that includes Knee arthroscopy (Right, 1991); Pyloric Stenosis surgery; Tubal ligation (2007); Cesarean section (1991, 2002, 2007); and Colonoscopy with propofol (N/A, 07/18/2015).   Her family history includes Arthritis (age of onset: 37) in her mother; Cancer in her maternal grandmother and paternal grandmother; Cancer (age of onset: 78) in her father; Diabetes in her paternal grandfather; Down syndrome in her daughter; Glaucoma in  her father; Hypertension in her father; Multiple sclerosis (age of onset: 47) in her sister.She reports that she has never smoked. She has never used smokeless tobacco. She reports that she drinks alcohol. She reports that she does not use drugs.  Outpatient Medications Prior to Visit  Medication Sig Dispense Refill  . cetirizine (ZYRTEC) 5 MG tablet Take 10 mg by mouth daily.     . Nutritional Supplements (JUICE PLUS FIBRE PO) Take 2 tablets by mouth daily.     No facility-administered medications prior to visit.     Review of Systems   Patient denies headache, fevers, malaise, unintentional weight loss, skin rash, eye pain, sinus congestion and sinus pain, sore throat, dysphagia,  hemoptysis , cough, dyspnea, wheezing, chest pain, palpitations, orthopnea, edema, abdominal pain, nausea, melena, diarrhea, constipation, flank pain, dysuria, hematuria, urinary  Frequency, nocturia, numbness, tingling, seizures,  Focal weakness, Loss of consciousness,  Tremor, insomnia, depression, anxiety, and suicidal ideation.      Objective:  BP (!) 140/100   Pulse 89   Wt 161 lb 6.4 oz (73.2 kg)   SpO2 96%   BMI 27.70 kg/m   Physical Exam   General appearance: alert, cooperative and appears stated age Head: Normocephalic, without obvious abnormality, atraumatic Eyes: conjunctivae/corneas clear. PERRL, EOM's intact. Fundi benign. Ears: normal TM's and external ear canals both ears Nose: Nares normal. Septum midline. Mucosa normal. No drainage or sinus tenderness. Throat: lips, mucosa, and tongue normal; teeth and gums normal Neck: no adenopathy, no carotid bruit, no JVD, supple, symmetrical, trachea midline and thyroid not enlarged, symmetric, no tenderness/mass/nodules Lungs: clear to auscultation bilaterally Breasts: normal appearance, no masses or tenderness Heart: regular rate and rhythm, S1, S2 normal, no murmur, click, rub or gallop Abdomen: soft, non-tender; bowel sounds normal; no masses,  no organomegaly Extremities: extremities normal, atraumatic, no cyanosis or edema Pulses: 2+ and symmetric Skin: Skin color, texture, turgor normal. No rashes or lesions Neurologic: Alert and oriented X 3, normal strength and tone. Normal symmetric reflexes. Normal coordination and gait.     Assessment & Plan:   Problem List Items Addressed This Visit    Routine general  medical examination at a health care facility    Annual comprehensive preventive exam was done as well as an evaluation and management of chronic conditions .  During the course of the visit the patient was educated and counseled about appropriate screening and preventive services including :  diabetes screening, lipid analysis with projected  10 year  risk for CAD , nutrition counseling, breast, cervical and colorectal cancer screening, and recommended immunizations.  Printed recommendations for health maintenance screenings was given      Relevant Orders   Measles/Mumps/Rubella Immunity   Quantiferon tb gold assay   Varicella Zoster Abs, IgG/IgM (Completed)   GAD (generalized anxiety disorder)    Adding alprazolam for insomnia       Other chest pain    EKG normal,  Reassurance provided that symptoms are due to anxiety.        Other Visit Diagnoses    Chest pain, unspecified type    -  Primary   Relevant Orders   EKG 12-Lead (Completed)   Elevated blood pressure reading without diagnosis of hypertension       Relevant Orders   Lipid Panel With LDL/HDL Ratio (Completed)   Comp Met (CMET) (Completed)   TSH (Completed)   Microalbumin / creatinine urine ratio (Completed)   Impaired fasting glucose  Relevant Orders   Hemoglobin A1c (Completed)      I have discontinued Ms. XENM'M Nutritional Supplements (JUICE PLUS FIBRE PO). I am also having her start on ALPRAZolam. Additionally, I am having her maintain her cetirizine.  Meds ordered this encounter  Medications  . ALPRAZolam (XANAX) 0.25 MG tablet    Sig: Take 1 tablet (0.25 mg total) by mouth at bedtime as needed for sleep.    Dispense:  20 tablet    Refill:  0    Medications Discontinued During This Encounter  Medication Reason  . Nutritional Supplements (JUICE PLUS FIBRE PO) Completed Course    Follow-up: Return in about 2 days (around 02/13/2016).   Crecencio Mc, MD

## 2016-02-11 NOTE — Patient Instructions (Signed)
I am prescribing a very low dose of alprazolam to help you relax and go to sleep at night Do NOT: Combine with alcohol or cough medicine or benadryl Share with others   Generalized Anxiety Disorder Generalized anxiety disorder (GAD) is a mental disorder. It interferes with life functions, including relationships, work, and school. GAD is different from normal anxiety, which everyone experiences at some point in their lives in response to specific life events and activities. Normal anxiety actually helps us prepare for and get through these life events and activities. Normal anxiety goes away after the event or activity is over.  GAD causes anxiety that is not necessarily related to specific events or activities. It also causes excess anxiety in proportion to specific events or activities. The anxiety associated with GAD is also difficult to control. GAD can vary from mild to severe. People with severe GAD can have intense waves of anxiety with physical symptoms (panic attacks).  SYMPTOMS The anxiety and worry associated with GAD are difficult to control. This anxiety and worry are related to many life events and activities and also occur more days than not for 6 months or longer. People with GAD also have three or more of the following symptoms (one or more in children):  Restlessness.   Fatigue.  Difficulty concentrating.   Irritability.  Muscle tension.  Difficulty sleeping or unsatisfying sleep. DIAGNOSIS GAD is diagnosed through an assessment by your health care provider. Your health care provider will ask you questions aboutyour mood,physical symptoms, and events in your life. Your health care provider may ask you about your medical history and use of alcohol or drugs, including prescription medicines. Your health care provider may also do a physical exam and blood tests. Certain medical conditions and the use of certain substances can cause symptoms similar to those associated with  GAD. Your health care provider may refer you to a mental health specialist for further evaluation. TREATMENT The following therapies are usually used to treat GAD:   Medication. Antidepressant medication usually is prescribed for long-term daily control. Antianxiety medicines may be added in severe cases, especially when panic attacks occur.   Talk therapy (psychotherapy). Certain types of talk therapy can be helpful in treating GAD by providing support, education, and guidance. A form of talk therapy called cognitive behavioral therapy can teach you healthy ways to think about and react to daily life events and activities.  Stress managementtechniques. These include yoga, meditation, and exercise and can be very helpful when they are practiced regularly. A mental health specialist can help determine which treatment is best for you. Some people see improvement with one therapy. However, other people require a combination of therapies.   This information is not intended to replace advice given to you by your health care provider. Make sure you discuss any questions you have with your health care provider.   Document Released: 07/26/2012 Document Revised: 04/21/2014 Document Reviewed: 07/26/2012 Elsevier Interactive Patient Education [employer]2016 [employer]

## 2016-02-11 NOTE — Telephone Encounter (Signed)
Alprazolam was called into [facility]'s [facility] [address].

## 2016-02-12 DIAGNOSIS — F411 Generalized anxiety disorder: Secondary | ICD-10-CM | POA: Insufficient documentation

## 2016-02-12 DIAGNOSIS — R0789 Other chest pain: Secondary | ICD-10-CM | POA: Insufficient documentation

## 2016-02-12 LAB — MEASLES/MUMPS/RUBELLA IMMUNITY
RUBELLA: 11.1 {index} — AB (ref <0.90)
Rubeola IgG: >300 AU/mL — ABNORMAL HIGH (ref <25.00)

## 2016-02-12 LAB — LIPID PANEL WITH LDL/HDL RATIO
CHOLESTEROL TOTAL: 140 mg/dL (ref 100–199)
HDL: 60 mg/dL (ref >39)
LDL Calculated: 67 mg/dL (ref 0–99)
LDl/HDL Ratio: 1.1 ratio units (ref 0.0–3.2)
Triglycerides: 63 mg/dL (ref 0–149)
VLDL CHOLESTEROL CAL: 13 mg/dL (ref 5–40)

## 2016-02-12 LAB — VARICELLA ZOSTER ABS, IGG/IGM: VARICELLA: 2213 {index} (ref >165)

## 2016-02-12 NOTE — Assessment & Plan Note (Signed)
Annual comprehensive preventive exam was done as well as an evaluation and management of chronic conditions .  During the course of the visit the patient was educated and counseled about appropriate screening and preventive services including :  diabetes screening, lipid analysis with projected  10 year  risk for CAD , nutrition counseling, breast, cervical and colorectal cancer screening, and recommended immunizations.  Printed recommendations for health maintenance screenings was given 

## 2016-02-12 NOTE — Assessment & Plan Note (Signed)
Adding alprazolam for insomnia

## 2016-02-12 NOTE — Assessment & Plan Note (Signed)
EKG normal,  Reassurance provided that symptoms are due to anxiety.

## 2016-02-13 ENCOUNTER — Ambulatory Visit (INDEPENDENT_AMBULATORY_CARE_PROVIDER_SITE_OTHER): Payer: BLUE CROSS/BLUE SHIELD

## 2016-02-13 ENCOUNTER — Encounter: Payer: Self-pay | Admitting: Internal Medicine

## 2016-02-13 DIAGNOSIS — Z23 Encounter for immunization: Secondary | ICD-10-CM

## 2016-02-13 LAB — QUANTIFERON TB GOLD ASSAY (BLOOD)
Interferon Gamma Release Assay: NEGATIVE
MITOGEN-NIL SO: 0.86 [IU]/mL
Quantiferon Nil Value: 0.09 IU/mL
Quantiferon Tb Ag Minus Nil Value: 0 IU/mL

## 2016-02-13 NOTE — Progress Notes (Signed)
Patient presents for Tdap vaccine.  Also presents me with Health examination certificate.  I informed patient we were still awaiting on lab result from some of lab titers .  Will forward form to Olegario MessierKathy, Dr Melina Schoolsullo's assistant until lab result come back and then we can call patient to come pick up.

## 2016-02-21 ENCOUNTER — Encounter: Payer: Self-pay | Admitting: Obstetrics and Gynecology

## 2016-02-21 ENCOUNTER — Ambulatory Visit (INDEPENDENT_AMBULATORY_CARE_PROVIDER_SITE_OTHER): Payer: BLUE CROSS/BLUE SHIELD | Admitting: Obstetrics and Gynecology

## 2016-02-21 VITALS — BP 118/78 | HR 98 | Ht 64.0 in | Wt 162.0 lb

## 2016-02-21 DIAGNOSIS — Z01419 Encounter for gynecological examination (general) (routine) without abnormal findings: Secondary | ICD-10-CM

## 2016-02-21 NOTE — Patient Instructions (Signed)
Preventive Care for Adults, Female A healthy lifestyle and preventive care can promote health and wellness. Preventive health guidelines for women include the following key practices.  A routine yearly physical is a good way to check with your health care provider about your health and preventive screening. It is a chance to share any concerns and updates on your health and to receive a thorough exam.  Visit your dentist for a routine exam and preventive care every 6 months. Brush your teeth twice a day and floss once a day. Good oral hygiene prevents tooth decay and gum disease.  The frequency of eye exams is based on your age, health, family medical history, use of contact lenses, and other factors. Follow your health care provider's recommendations for frequency of eye exams.  Eat a healthy diet. Foods like vegetables, fruits, whole grains, low-fat dairy products, and lean protein foods contain the nutrients you need without too many calories. Decrease your intake of foods high in solid fats, added sugars, and salt. Eat the right amount of calories for you.Get information about a proper diet from your health care provider, if necessary.  Regular physical exercise is one of the most important things you can do for your health. Most adults should get at least 150 minutes of moderate-intensity exercise (any activity that increases your heart rate and causes you to sweat) each week. In addition, most adults need muscle-strengthening exercises on 2 or more days a week.  Maintain a healthy weight. The body mass index (BMI) is a screening tool to identify possible weight problems. It provides an estimate of body fat based on height and weight. Your health care provider can find your BMI and can help you achieve or maintain a healthy weight.For adults 20 years and older:  A BMI below 18.5 is considered underweight.  A BMI of 18.5 to 24.9 is normal.  A BMI of 25 to 29.9 is considered  overweight.  A BMI of 30 and above is considered obese.  Maintain normal blood lipids and cholesterol levels by exercising and minimizing your intake of saturated fat. Eat a balanced diet with plenty of fruit and vegetables. Blood tests for lipids and cholesterol should begin at age 64 and be repeated every 5 years. If your lipid or cholesterol levels are high, you are over 50, or you are at high risk for heart disease, you may need your cholesterol levels checked more frequently.Ongoing high lipid and cholesterol levels should be treated with medicines if diet and exercise are not working.  If you smoke, find out from your health care provider how to quit. If you do not use tobacco, do not start.  Lung cancer screening is recommended for adults aged 52-80 years who are at high risk for developing lung cancer because of a history of smoking. A yearly low-dose CT scan of the lungs is recommended for people who have at least a 30-pack-year history of smoking and are a current smoker or have quit within the past 15 years. A pack year of smoking is smoking an average of 1 pack of cigarettes a day for 1 year (for example: 1 pack a day for 30 years or 2 packs a day for 15 years). Yearly screening should continue until the smoker has stopped smoking for at least 15 years. Yearly screening should be stopped for people who develop a health problem that would prevent them from having lung cancer treatment.  If you are pregnant, do not drink alcohol. If you are  breastfeeding, be very cautious about drinking alcohol. If you are not pregnant and choose to drink alcohol, do not have more than 1 drink per day. One drink is considered to be 12 ounces (355 mL) of beer, 5 ounces (148 mL) of wine, or 1.5 ounces (44 mL) of liquor.  Avoid use of street drugs. Do not share needles with anyone. Ask for help if you need support or instructions about stopping the use of drugs.  High blood pressure causes heart disease and  increases the risk of stroke. Your blood pressure should be checked at least every 1 to 2 years. Ongoing high blood pressure should be treated with medicines if weight loss and exercise do not work.  If you are 25-78 years old, ask your health care provider if you should take aspirin to prevent strokes.  Diabetes screening is done by taking a blood sample to check your blood glucose level after you have not eaten for a certain period of time (fasting). If you are not overweight and you do not have risk factors for diabetes, you should be screened once every 3 years starting at age 86. If you are overweight or obese and you are 3-87 years of age, you should be screened for diabetes every year as part of your cardiovascular risk assessment.  Breast cancer screening is essential preventive care for women. You should practice "breast self-awareness." This means understanding the normal appearance and feel of your breasts and may include breast self-examination. Any changes detected, no matter how small, should be reported to a health care provider. Women in their 66s and 30s should have a clinical breast exam (CBE) by a health care provider as part of a regular health exam every 1 to 3 years. After age 43, women should have a CBE every year. Starting at age 37, women should consider having a mammogram (breast X-ray test) every year. Women who have a family history of breast cancer should talk to their health care provider about genetic screening. Women at a high risk of breast cancer should talk to their health care providers about having an MRI and a mammogram every year.  Breast cancer gene (BRCA)-related cancer risk assessment is recommended for women who have family members with BRCA-related cancers. BRCA-related cancers include breast, ovarian, tubal, and peritoneal cancers. Having family members with these cancers may be associated with an increased risk for harmful changes (mutations) in the breast  cancer genes BRCA1 and BRCA2. Results of the assessment will determine the need for genetic counseling and BRCA1 and BRCA2 testing.  Your health care provider may recommend that you be screened regularly for cancer of the pelvic organs (ovaries, uterus, and vagina). This screening involves a pelvic examination, including checking for microscopic changes to the surface of your cervix (Pap test). You may be encouraged to have this screening done every 3 years, beginning at age 78.  For women ages 79-65, health care providers may recommend pelvic exams and Pap testing every 3 years, or they may recommend the Pap and pelvic exam, combined with testing for human papilloma virus (HPV), every 5 years. Some types of HPV increase your risk of cervical cancer. Testing for HPV may also be done on women of any age with unclear Pap test results.  Other health care providers may not recommend any screening for nonpregnant women who are considered low risk for pelvic cancer and who do not have symptoms. Ask your health care provider if a screening pelvic exam is right for  you.  If you have had past treatment for cervical cancer or a condition that could lead to cancer, you need Pap tests and screening for cancer for at least 20 years after your treatment. If Pap tests have been discontinued, your risk factors (such as having a new sexual partner) need to be reassessed to determine if screening should resume. Some women have medical problems that increase the chance of getting cervical cancer. In these cases, your health care provider may recommend more frequent screening and Pap tests.  Colorectal cancer can be detected and often prevented. Most routine colorectal cancer screening begins at the age of 50 years and continues through age 75 years. However, your health care provider may recommend screening at an earlier age if you have risk factors for colon cancer. On a yearly basis, your health care provider may provide  home test kits to check for hidden blood in the stool. Use of a small camera at the end of a tube, to directly examine the colon (sigmoidoscopy or colonoscopy), can detect the earliest forms of colorectal cancer. Talk to your health care provider about this at age 50, when routine screening begins. Direct exam of the colon should be repeated every 5-10 years through age 75 years, unless early forms of precancerous polyps or small growths are found.  People who are at an increased risk for hepatitis B should be screened for this virus. You are considered at high risk for hepatitis B if:  You were born in a country where hepatitis B occurs often. Talk with your health care provider about which countries are considered high risk.  Your parents were born in a high-risk country and you have not received a shot to protect against hepatitis B (hepatitis B vaccine).  You have HIV or AIDS.  You use needles to inject street drugs.  You live with, or have sex with, someone who has hepatitis B.  You get hemodialysis treatment.  You take certain medicines for conditions like cancer, organ transplantation, and autoimmune conditions.  Hepatitis C blood testing is recommended for all people born from 1945 through 1965 and any individual with known risks for hepatitis C.  Practice safe sex. Use condoms and avoid high-risk sexual practices to reduce the spread of sexually transmitted infections (STIs). STIs include gonorrhea, chlamydia, syphilis, trichomonas, herpes, HPV, and human immunodeficiency virus (HIV). Herpes, HIV, and HPV are viral illnesses that have no cure. They can result in disability, cancer, and death.  You should be screened for sexually transmitted illnesses (STIs) including gonorrhea and chlamydia if:  You are sexually active and are younger than 24 years.  You are older than 24 years and your health care provider tells you that you are at risk for this type of infection.  Your sexual  activity has changed since you were last screened and you are at an increased risk for chlamydia or gonorrhea. Ask your health care provider if you are at risk.  If you are at risk of being infected with HIV, it is recommended that you take a prescription medicine daily to prevent HIV infection. This is called preexposure prophylaxis (PrEP). You are considered at risk if:  You are sexually active and do not regularly use condoms or know the HIV status of your partner(s).  You take drugs by injection.  You are sexually active with a partner who has HIV.  Talk with your health care provider about whether you are at high risk of being infected with HIV. If   you choose to begin PrEP, you should first be tested for HIV. You should then be tested every 3 months for as long as you are taking PrEP.  Osteoporosis is a disease in which the bones lose minerals and strength with aging. This can result in serious bone fractures or breaks. The risk of osteoporosis can be identified using a bone density scan. Women ages 1 years and over and women at risk for fractures or osteoporosis should discuss screening with their health care providers. Ask your health care provider whether you should take a calcium supplement or vitamin D to reduce the rate of osteoporosis.  Menopause can be associated with physical symptoms and risks. Hormone replacement therapy is available to decrease symptoms and risks. You should talk to your health care provider about whether hormone replacement therapy is right for you.  Use sunscreen. Apply sunscreen liberally and repeatedly throughout the day. You should seek shade when your shadow is shorter than you. Protect yourself by wearing long sleeves, pants, a wide-brimmed hat, and sunglasses year round, whenever you are outdoors.  Once a month, do a whole body skin exam, using a mirror to look at the skin on your back. Tell your health care provider of new moles, moles that have irregular  borders, moles that are larger than a pencil eraser, or moles that have changed in shape or color.  Stay current with required vaccines (immunizations).  Influenza vaccine. All adults should be immunized every year.  Tetanus, diphtheria, and acellular pertussis (Td, Tdap) vaccine. Pregnant women should receive 1 dose of Tdap vaccine during each pregnancy. The dose should be obtained regardless of the length of time since the last dose. Immunization is preferred during the 27th-36th week of gestation. An adult who has not previously received Tdap or who does not know her vaccine status should receive 1 dose of Tdap. This initial dose should be followed by tetanus and diphtheria toxoids (Td) booster doses every 10 years. Adults with an unknown or incomplete history of completing a 3-dose immunization series with Td-containing vaccines should begin or complete a primary immunization series including a Tdap dose. Adults should receive a Td booster every 10 years.  Varicella vaccine. An adult without evidence of immunity to varicella should receive 2 doses or a second dose if she has previously received 1 dose. Pregnant females who do not have evidence of immunity should receive the first dose after pregnancy. This first dose should be obtained before leaving the health care facility. The second dose should be obtained 4-8 weeks after the first dose.  Human papillomavirus (HPV) vaccine. Females aged 13-26 years who have not received the vaccine previously should obtain the 3-dose series. The vaccine is not recommended for use in pregnant females. However, pregnancy testing is not needed before receiving a dose. If a female is found to be pregnant after receiving a dose, no treatment is needed. In that case, the remaining doses should be delayed until after the pregnancy. Immunization is recommended for any person with an immunocompromised condition through the age of 24 years if she did not get any or all doses  earlier. During the 3-dose series, the second dose should be obtained 4-8 weeks after the first dose. The third dose should be obtained 24 weeks after the first dose and 16 weeks after the second dose.  Zoster vaccine. One dose is recommended for adults aged 97 years or older unless certain conditions are present.  Measles, mumps, and rubella (MMR) vaccine. Adults born  before 1957 generally are considered immune to measles and mumps. Adults born in [birth year] or later should have 1 or more doses of MMR vaccine unless there is a contraindication to the vaccine or there is laboratory evidence of immunity to each of the three diseases. A routine second dose of MMR vaccine should be obtained at least 28 days after the first dose for students attending postsecondary schools, health care workers, or international travelers. People who received inactivated measles vaccine or an unknown type of measles vaccine during 1963-1967 should receive 2 doses of MMR vaccine. People who received inactivated mumps vaccine or an unknown type of mumps vaccine before 1979 and are at high risk for mumps infection should consider immunization with 2 doses of MMR vaccine. For females of childbearing age, rubella immunity should be determined. If there is no evidence of immunity, females who are not pregnant should be vaccinated. If there is no evidence of immunity, females who are pregnant should delay immunization until after pregnancy. Unvaccinated health care workers born before 60 who lack laboratory evidence of measles, mumps, or rubella immunity or laboratory confirmation of disease should consider measles and mumps immunization with 2 doses of MMR vaccine or rubella immunization with 1 dose of MMR vaccine.  Pneumococcal 13-valent conjugate (PCV13) vaccine. When indicated, a person who is uncertain of his immunization history and has no record of immunization should receive the PCV13 vaccine. All adults 61 years of age and older  should receive this vaccine. An adult aged 92 years or older who has certain medical conditions and has not been previously immunized should receive 1 dose of PCV13 vaccine. This PCV13 should be followed with a dose of pneumococcal polysaccharide (PPSV23) vaccine. Adults who are at high risk for pneumococcal disease should obtain the PPSV23 vaccine at least 8 weeks after the dose of PCV13 vaccine. Adults older than 52 years of age who have normal immune system function should obtain the PPSV23 vaccine dose at least 1 year after the dose of PCV13 vaccine.  Pneumococcal polysaccharide (PPSV23) vaccine. When PCV13 is also indicated, PCV13 should be obtained first. All adults aged 2 years and older should be immunized. An adult younger than age 30 years who has certain medical conditions should be immunized. Any person who resides in a nursing home or long-term care facility should be immunized. An adult smoker should be immunized. People with an immunocompromised condition and certain other conditions should receive both PCV13 and PPSV23 vaccines. People with human immunodeficiency virus (HIV) infection should be immunized as soon as possible after diagnosis. Immunization during chemotherapy or radiation therapy should be avoided. Routine use of PPSV23 vaccine is not recommended for American Indians, [place] Natives, or people younger than 65 years unless there are medical conditions that require PPSV23 vaccine. When indicated, people who have unknown immunization and have no record of immunization should receive PPSV23 vaccine. One-time revaccination 5 years after the first dose of PPSV23 is recommended for people aged 19-64 years who have chronic kidney failure, nephrotic syndrome, asplenia, or immunocompromised conditions. People who received 1-2 doses of PPSV23 before age 44 years should receive another dose of PPSV23 vaccine at age 83 years or later if at least 5 years have passed since the previous dose. Doses  of PPSV23 are not needed for people immunized with PPSV23 at or after age 20 years.  Meningococcal vaccine. Adults with asplenia or persistent complement component deficiencies should receive 2 doses of quadrivalent meningococcal conjugate (MenACWY-D) vaccine. The doses should be obtained  at least 2 months apart. Microbiologists working with certain meningococcal bacteria, [place] recruits, people at risk during an outbreak, and people who travel to or live in countries with a high rate of meningitis should be immunized. A first-year college student up through age 28 years who is living in a residence hall should receive a dose if she did not receive a dose on or after her 16th birthday. Adults who have certain high-risk conditions should receive one or more doses of vaccine.  Hepatitis A vaccine. Adults who wish to be protected from this disease, have certain high-risk conditions, work with hepatitis A-infected animals, work in hepatitis A research labs, or travel to or work in countries with a high rate of hepatitis A should be immunized. Adults who were previously unvaccinated and who anticipate close contact with an international adoptee during the first 60 days after arrival in the [place] from a country with a high rate of hepatitis A should be immunized.  Hepatitis B vaccine. Adults who wish to be protected from this disease, have certain high-risk conditions, may be exposed to blood or other infectious body fluids, are household contacts or sex partners of hepatitis B positive people, are clients or workers in certain care facilities, or travel to or work in countries with a high rate of hepatitis B should be immunized.  Haemophilus influenzae type b (Hib) vaccine. A previously unvaccinated person with asplenia or sickle cell disease or having a scheduled splenectomy should receive 1 dose of Hib vaccine. Regardless of previous immunization, a recipient of a hematopoietic stem cell transplant  should receive a 3-dose series 6-12 months after her successful transplant. Hib vaccine is not recommended for adults with HIV infection. Preventive Services / Frequency Ages 71 to 87 years  Blood pressure check.** / Every 3-5 years.  Lipid and cholesterol check.** / Every 5 years beginning at age 1.  Clinical breast exam.** / Every 3 years for women in their 3s and 31s.  BRCA-related cancer risk assessment.** / For women who have family members with a BRCA-related cancer (breast, ovarian, tubal, or peritoneal cancers).  Pap test.** / Every 2 years from ages 50 through 86. Every 3 years starting at age 87 through age 7 or 75 with a history of 3 consecutive normal Pap tests.  HPV screening.** / Every 3 years from ages 59 through ages 35 to 6 with a history of 3 consecutive normal Pap tests.  Hepatitis C blood test.** / For any individual with known risks for hepatitis C.  Skin self-exam. / Monthly.  Influenza vaccine. / Every year.  Tetanus, diphtheria, and acellular pertussis (Tdap, Td) vaccine.** / Consult your health care provider. Pregnant women should receive 1 dose of Tdap vaccine during each pregnancy. 1 dose of Td every 10 years.  Varicella vaccine.** / Consult your health care provider. Pregnant females who do not have evidence of immunity should receive the first dose after pregnancy.  HPV vaccine. / 3 doses over 6 months, if 72 and younger. The vaccine is not recommended for use in pregnant females. However, pregnancy testing is not needed before receiving a dose.  Measles, mumps, rubella (MMR) vaccine.** / You need at least 1 dose of MMR if you were born in 1957 or later. You may also need a 2nd dose. For females of childbearing age, rubella immunity should be determined. If there is no evidence of immunity, females who are not pregnant should be vaccinated. If there is no evidence of immunity, females who are  pregnant should delay immunization until after  pregnancy.  Pneumococcal 13-valent conjugate (PCV13) vaccine.** / Consult your health care provider.  Pneumococcal polysaccharide (PPSV23) vaccine.** / 1 to 2 doses if you smoke cigarettes or if you have certain conditions.  Meningococcal vaccine.** / 1 dose if you are age 87 to 44 years and a Market researcher living in a residence hall, or have one of several medical conditions, you need to get vaccinated against meningococcal disease. You may also need additional booster doses.  Hepatitis A vaccine.** / Consult your health care provider.  Hepatitis B vaccine.** / Consult your health care provider.  Haemophilus influenzae type b (Hib) vaccine.** / Consult your health care provider. Ages 86 to 38 years  Blood pressure check.** / Every year.  Lipid and cholesterol check.** / Every 5 years beginning at age 49 years.  Lung cancer screening. / Every year if you are aged 71-80 years and have a 30-pack-year history of smoking and currently smoke or have quit within the past 15 years. Yearly screening is stopped once you have quit smoking for at least 15 years or develop a health problem that would prevent you from having lung cancer treatment.  Clinical breast exam.** / Every year after age 51 years.  BRCA-related cancer risk assessment.** / For women who have family members with a BRCA-related cancer (breast, ovarian, tubal, or peritoneal cancers).  Mammogram.** / Every year beginning at age 18 years and continuing for as long as you are in good health. Consult with your health care provider.  Pap test.** / Every 3 years starting at age 63 years through age 37 or 57 years with a history of 3 consecutive normal Pap tests.  HPV screening.** / Every 3 years from ages 41 years through ages 76 to 23 years with a history of 3 consecutive normal Pap tests.  Fecal occult blood test (FOBT) of stool. / Every year beginning at age 36 years and continuing until age 51 years. You may not need  to do this test if you get a colonoscopy every 10 years.  Flexible sigmoidoscopy or colonoscopy.** / Every 5 years for a flexible sigmoidoscopy or every 10 years for a colonoscopy beginning at age 36 years and continuing until age 35 years.  Hepatitis C blood test.** / For all people born from [birth year] through 1965 and any individual with known risks for hepatitis C.  Skin self-exam. / Monthly.  Influenza vaccine. / Every year.  Tetanus, diphtheria, and acellular pertussis (Tdap/Td) vaccine.** / Consult your health care provider. Pregnant women should receive 1 dose of Tdap vaccine during each pregnancy. 1 dose of Td every 10 years.  Varicella vaccine.** / Consult your health care provider. Pregnant females who do not have evidence of immunity should receive the first dose after pregnancy.  Zoster vaccine.** / 1 dose for adults aged 73 years or older.  Measles, mumps, rubella (MMR) vaccine.** / You need at least 1 dose of MMR if you were born in 1957 or later. You may also need a second dose. For females of childbearing age, rubella immunity should be determined. If there is no evidence of immunity, females who are not pregnant should be vaccinated. If there is no evidence of immunity, females who are pregnant should delay immunization until after pregnancy.  Pneumococcal 13-valent conjugate (PCV13) vaccine.** / Consult your health care provider.  Pneumococcal polysaccharide (PPSV23) vaccine.** / 1 to 2 doses if you smoke cigarettes or if you have certain conditions.  Meningococcal vaccine.** /  Consult your health care provider.  Hepatitis A vaccine.** / Consult your health care provider.  Hepatitis B vaccine.** / Consult your health care provider.  Haemophilus influenzae type b (Hib) vaccine.** / Consult your health care provider. Ages 80 years and over  Blood pressure check.** / Every year.  Lipid and cholesterol check.** / Every 5 years beginning at age 62 years.  Lung cancer  screening. / Every year if you are aged 32-80 years and have a 30-pack-year history of smoking and currently smoke or have quit within the past 15 years. Yearly screening is stopped once you have quit smoking for at least 15 years or develop a health problem that would prevent you from having lung cancer treatment.  Clinical breast exam.** / Every year after age 61 years.  BRCA-related cancer risk assessment.** / For women who have family members with a BRCA-related cancer (breast, ovarian, tubal, or peritoneal cancers).  Mammogram.** / Every year beginning at age 39 years and continuing for as long as you are in good health. Consult with your health care provider.  Pap test.** / Every 3 years starting at age 85 years through age 74 or 72 years with 3 consecutive normal Pap tests. Testing can be stopped between 65 and 70 years with 3 consecutive normal Pap tests and no abnormal Pap or HPV tests in the past 10 years.  HPV screening.** / Every 3 years from ages 55 years through ages 67 or 77 years with a history of 3 consecutive normal Pap tests. Testing can be stopped between 65 and 70 years with 3 consecutive normal Pap tests and no abnormal Pap or HPV tests in the past 10 years.  Fecal occult blood test (FOBT) of stool. / Every year beginning at age 81 years and continuing until age 22 years. You may not need to do this test if you get a colonoscopy every 10 years.  Flexible sigmoidoscopy or colonoscopy.** / Every 5 years for a flexible sigmoidoscopy or every 10 years for a colonoscopy beginning at age 67 years and continuing until age 22 years.  Hepatitis C blood test.** / For all people born from [birth year] through 1967 and any individual with known risks for hepatitis C.  Osteoporosis screening.** / A one-time screening for women ages 8 years and over and women at risk for fractures or osteoporosis.  Skin self-exam. / Monthly.  Influenza vaccine. / Every year.  Tetanus, diphtheria, and  acellular pertussis (Tdap/Td) vaccine.** / 1 dose of Td every 10 years.  Varicella vaccine.** / Consult your health care provider.  Zoster vaccine.** / 1 dose for adults aged 56 years or older.  Pneumococcal 13-valent conjugate (PCV13) vaccine.** / Consult your health care provider.  Pneumococcal polysaccharide (PPSV23) vaccine.** / 1 dose for all adults aged 15 years and older.  Meningococcal vaccine.** / Consult your health care provider.  Hepatitis A vaccine.** / Consult your health care provider.  Hepatitis B vaccine.** / Consult your health care provider.  Haemophilus influenzae type b (Hib) vaccine.** / Consult your health care provider. ** Family history and personal history of risk and conditions may change your health care provider's recommendations.   This information is not intended to replace advice given to you by your health care provider. Make sure you discuss any questions you have with your health care provider.   Document Released: 05/27/2001 Document Revised: 04/21/2014 Document Reviewed: 08/26/2010 Elsevier Interactive Patient Education [employer].

## 2016-02-21 NOTE — Progress Notes (Signed)
Subjective:   Cynthia AuerbachDeborah M Ruiz is a 50 y.o. G3P3 Caucasian female here for a routine well-woman exam.  No LMP recorded. Patient is not currently having periods (Reason: Perimenopausal).    Current complaints: no menses since last year, except occasional spotting when wiping. 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  Past Medical History Past Medical History:  Diagnosis Date  . Anxiety     Past Surgical History Past Surgical History:  Procedure Laterality Date  . CESAREAN SECTION  1991, 2002, 2007  . COLONOSCOPY WITH PROPOFOL N/A 07/18/2015   Procedure: COLONOSCOPY WITH PROPOFOL;  Surgeon: Earline MayotteJeffrey W Byrnett, MD;  [facility];  Service: Endoscopy;  Laterality: N/A;  . KNEE ARTHROSCOPY Right 1991  . Pyloric Stenosis surgery    . TUBAL LIGATION  2007    Gynecologic History G3P3  No LMP recorded. Patient is not currently having periods (Reason: Perimenopausal). Contraception: post menopausal status Last Pap: 2014. Results were: normal Last mammogram: 2017. Results were: normal  Obstetric History OB History  Gravida Para Term Preterm AB Living  3 3          SAB TAB Ectopic Multiple Live Births               # Outcome Date GA Lbr Len/2nd Weight Sex Delivery Anes PTL Lv  3 Para           2 Para           1 Para             Obstetric Comments  1st Menstrual Cycle:  10   1st Pregnancy:  24    Current Medications Current Outpatient Prescriptions on File Prior to Visit  Medication Sig Dispense Refill  . ALPRAZolam  (XANAX) 0.25 MG tablet Take 1 tablet (0.25 mg total) by mouth at bedtime as needed for sleep. 20 tablet 0  . cetirizine (ZYRTEC) 5 MG tablet Take 10 mg by mouth daily.     No current facility-administered medications on file prior to visit.     Review of Systems Patient denies any headaches, blurred vision, shortness of breath, chest pain, abdominal pain, problems with bowel movements, urination, or intercourse.  Objective:  BP 118/78   Pulse 98   Ht 5\' 4"  (1.626 m)   Wt 162 lb (73.5 kg)   BMI 27.81 kg/m  Physical Exam  General:  Well developed, well nourished, no acute distress. She is alert and oriented x3. Skin:  Warm and dry Neck:  Midline trachea, no thyromegaly or nodules Cardiovascular: Regular rate and rhythm, no murmur heard Lungs:  Effort normal, all lung fields clear to auscultation bilaterally Breasts:  No dominant palpable mass, retraction, or nipple discharge Abdomen:  Soft, non tender, no hepatosplenomegaly or masses Pelvic:  External genitalia is normal in appearance.  The vagina is normal in appearance. The cervix is bulbous, no CMT.  Thin prep pap is not done. Uterus is felt to be normal size, shape, and contour.  No adnexal masses or tenderness noted. Extremities:  No swelling or varicosities noted Psych:  She has a normal  mood and affect  Assessment:   Healthy well-woman exam  Plan:   F/U 1 year for AE, or sooner if needed  Samwise Eckardt Suzan NailerN Livie Vanderhoof, CNM

## 2016-05-24 ENCOUNTER — Encounter: Payer: Self-pay | Admitting: Internal Medicine

## 2016-05-29 ENCOUNTER — Telehealth: Payer: Self-pay | Admitting: Internal Medicine

## 2016-05-29 NOTE — Telephone Encounter (Signed)
fyi

## 2016-05-29 NOTE — Telephone Encounter (Signed)
Patient has an appointment scheduled with Dr. Birdie SonsSonnenberg for 05/30/16,, called patient she started having headache to base of head , Advil helps and warmth applied to back of neck helps. X 24 hours ago. Patient stated she has been flush in the face for the last 2 hours, patient has been exposed to her child with virus of sore throat and in the beginning patient child had headache., daughter was not tested for flu or strep. 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 FYI , patient was strongly encouraged to keep appointment with Dr. Birdie SonsSonnenberg.

## 2016-05-29 NOTE — Telephone Encounter (Signed)
[facility] Day - [place] TELEPHONE ADVICE RECORD [facility] Patient Name: Cynthia Ruiz DOB: 1965-12-02 Initial Comment Caller has been having a pain in the base of her skull in the back. Started yesterday morning. Felt agitated Nurse Assessment Nurse: Lane HackerHarley, RN, Elvin SoWindy Date/Time Lamount Cohen(Eastern Time): 05/29/2016 2:18:39 PM Confirm and document reason for call. If symptomatic, describe symptoms. ---Caller has been having a pain in the base of her skull in the back of head. Started yesterday morning. Its causing her to feel agitated/couldn't sit still last night. Taking Advil helps, but then it comes back strong. Right now it is dull pain. No fever. Does the patient have any new or worsening symptoms? ---Yes Will a triage be completed? ---Yes Related visit to physician within the last 2 weeks? ---No Does the PT have any chronic conditions? (i.e. diabetes, asthma, etc.) ---Yes List chronic conditions. ---Migraines during college only Is the patient pregnant or possibly pregnant? (Ask all females between the ages of 5112-55) ---No Is this a behavioral health or substance abuse call? ---No Guidelines Guideline Title Affirmed Question Affirmed Notes Headache [1] MODERATE headache (e.g., interferes with normal activities) AND [2] present > 24 hours AND [3] unexplained (Exceptions: analgesics not tried, typical migraine, or headache part of viral illness) Final Disposition User See Physician within [address]24 Hours [place], [place], [place] Comments Rates HA pain now at 6/10, dull pain. No appt available with Dr. Darrick Huntsmanullo. Appt made with Minerva AreolaEric for tomorrow at 10:30 Referrals REFERRED TO PCP OFFICE Disagree/Comply: Comply

## 2016-05-29 NOTE — Telephone Encounter (Signed)
Pt called about having this pain for 2 days pain in base of skull on the center started moving. Started yesterday morning. Pt just wanted to get some clarification. Just in the head only. Please advise? Pt was transferred to [facility].  Call pt @ [phone]. Thank you!

## 2016-05-30 ENCOUNTER — Ambulatory Visit (INDEPENDENT_AMBULATORY_CARE_PROVIDER_SITE_OTHER): Payer: BLUE CROSS/BLUE SHIELD | Admitting: Family Medicine

## 2016-05-30 ENCOUNTER — Encounter: Payer: Self-pay | Admitting: Family Medicine

## 2016-05-30 DIAGNOSIS — G44209 Tension-type headache, unspecified, not intractable: Secondary | ICD-10-CM

## 2016-05-30 DIAGNOSIS — R51 Headache: Secondary | ICD-10-CM

## 2016-05-30 DIAGNOSIS — R519 Headache, unspecified: Secondary | ICD-10-CM | POA: Insufficient documentation

## 2016-05-30 NOTE — Progress Notes (Signed)
  Marikay AlarEric Lucero Auzenne, MD Phone: [phone]  Cynthia Ruiz is a 51 y.o. female who presents today for same-day visit.  She reports about 3 days of posterior headache. She notes it comes and goes. Came on gradually while at work. She points to the right posterior base of her skull where the trapezius muscles and paraspinous muscles come in to attach to her skull. She notes no upper respiratory symptoms. No fevers. She feels well overall other than the headache. She notes no vision changes, numbness, or weakness. She has tried Advil and heat. She has a history of migraines in college and a history of tension headaches in the frontal region and sinus headaches. Has never had a headache like this previously. It was not a sudden onset worst headache of life. No headache at this time.  ROS see history of present illness  Objective  Physical Exam Vitals:   05/30/16 1029  BP: 118/82  Pulse: 86  Temp: 98.5 F (36.9 C)    BP Readings from Last 3 Encounters:  05/30/16 118/82  02/21/16 118/78  02/11/16 (!) 140/100   Wt Readings from Last 3 Encounters:  05/30/16 163 lb 3.2 oz (74 kg)  02/21/16 162 lb (73.5 kg)  02/11/16 161 lb 6.4 oz (73.2 kg)    Physical Exam  Constitutional: She is well-developed, well-nourished, and in no distress.  HENT:  Head: Normocephalic and atraumatic.  Mouth/Throat: Oropharynx is clear and moist. No oropharyngeal exudate.  Eyes: Conjunctivae are normal. Pupils are equal, round, and reactive to light.  Cardiovascular: Normal rate, regular rhythm and normal heart sounds.   Pulmonary/Chest: Effort normal and breath sounds normal.  Musculoskeletal:  No midline neck tenderness, no midline neck step off, no muscular tenderness, no tenderness of the skull in the area of her headaches  Neurological: She is alert.  CN 2-12 intact, 5/5 strength in bilateral biceps, triceps, grip, quads, hamstrings, plantar and dorsiflexion, sensation to light touch intact in bilateral UE  and LE, normal gait, finger-to-nose, normal rapid alternating movements, negative Romberg, no pronator drift  Skin: Skin is warm and dry.     Assessment/Plan: Please see individual problem list.  Headache Suspect tension-type headache possibly from muscular strain of trapezius or paraspinous muscles given that the headache occurs at the insertion point of these muscles posteriorly. She is neurologically intact. She has no other symptoms with this. She is well-appearing. Her vitals are stable. Discussed continuing heat and Advil. If she continues to have headaches over the next week would consider imaging. She is given return precautions.   Marikay AlarEric Pascuala Klutts, MD [facility]

## 2016-05-30 NOTE — Assessment & Plan Note (Signed)
Suspect tension-type headache possibly from muscular strain of trapezius or paraspinous muscles given that the headache occurs at the insertion point of these muscles posteriorly. She is neurologically intact. She has no other symptoms with this. She is well-appearing. Her vitals are stable. Discussed continuing heat and Advil. If she continues to have headaches over the next week would consider imaging. She is given return precautions.

## 2016-05-30 NOTE — Progress Notes (Signed)
Pre visit review using our clinic review tool, if applicable. No additional management support is needed unless otherwise documented below in the visit note. 

## 2016-05-30 NOTE — Patient Instructions (Signed)
Nice to see you. I believe the headache you have gotten is related to muscular strain in your neck. You can continue heat and Advil for this. The maximum dose of Advil is 800 mg every 8 hours. If you have persistent headaches over the next week or so please let us know as we should then look into it further. If you develop any numbness, weakness, vision changes, worsening headaches, or any new or change in symptoms please seek medical attention medially.

## 2016-06-30 ENCOUNTER — Ambulatory Visit (INDEPENDENT_AMBULATORY_CARE_PROVIDER_SITE_OTHER): Payer: BLUE CROSS/BLUE SHIELD | Admitting: Internal Medicine

## 2016-06-30 ENCOUNTER — Encounter: Payer: Self-pay | Admitting: Internal Medicine

## 2016-06-30 DIAGNOSIS — M25511 Pain in right shoulder: Secondary | ICD-10-CM | POA: Diagnosis not present

## 2016-06-30 DIAGNOSIS — F411 Generalized anxiety disorder: Secondary | ICD-10-CM

## 2016-06-30 MED ORDER — TRAMADOL HCL 50 MG PO TABS: 50.0000 mg | ORAL_TABLET | Freq: Four times a day (QID) | ORAL | 0 refills | Status: DC | PRN

## 2016-06-30 MED ORDER — CYCLOBENZAPRINE HCL 10 MG PO TABS
10.0000 mg | ORAL_TABLET | Freq: Three times a day (TID) | ORAL | 0 refills | Status: DC | PRN
Start: 2016-06-30 — End: 2016-08-19

## 2016-06-30 MED ORDER — ESCITALOPRAM OXALATE 10 MG PO TABS: 10.0000 mg | ORAL_TABLET | Freq: Every day | ORAL | 5 refills | Status: DC

## 2016-06-30 MED ORDER — CELECOXIB 200 MG PO CAPS: 200.0000 mg | ORAL_CAPSULE | Freq: Two times a day (BID) | ORAL | 1 refills | Status: DC

## 2016-06-30 NOTE — Progress Notes (Signed)
Pre visit review using our clinic review tool, if applicable. No additional management support is needed unless otherwise documented below in the visit note. 

## 2016-06-30 NOTE — Patient Instructions (Addendum)
You can use 200 mg CELEBREX every 12 hours OR 800 MG ADVIL EVERY 6 HOURS AS YOUR ANTI INFLAMMATORY   TRAMADOL PLUS TYLENOL EVERY 6 HOURS FOR PAIN  FLEXERIL MUSCLE RELAXER TAKE IN EVENING ONLY    ICE 15 MINUTES EVERY FEW HOURS   NO MORE LIFTING THE VACUUM CLEANER!1  Consider starting lexapro to help manage your anxiety (anger)

## 2016-06-30 NOTE — Progress Notes (Signed)
Subjective:  Patient ID: Cynthia Ruiz, female    DOB: Mar 11, 1966  Age: 51 y.o. MRN: [id number]  CC: The encounter diagnosis was GAD (generalized anxiety disorder).  HPI Cynthia Ruiz presents for evaluation of right shoulder pain accompanied by right sided chest wall pain. The pain started last night over the right scapula and wrapped around to the right chest wall. Previous activity included lifting a vacuum cleaner  Repeatedly while cleaning up at home   Working full time since November,  Walking constantly.  Sprained left shoulder with injury to rotator cuff in November ,  [place].  No surgery.  Has been using tramadol and tylenol today.  Increased irritability.  Youngest daughter has Down's syndrome, and she  sees daughter copying her dismissive behavror toward her husband..  She acknowledges her resentment toward him and fins herself annoyed by his inability to help out ore at [employer]  Outpatient Medications Prior to Visit  Medication Sig Dispense Refill  . ALPRAZolam (XANAX) 0.25 MG tablet Take 1 tablet (0.25 mg total) by mouth at bedtime as needed for sleep. 20 tablet 0  . cetirizine (ZYRTEC) 5 MG tablet Take 10 mg by mouth daily.     No facility-administered medications prior to visit.     Review of Systems;  Patient denies headache, fevers, malaise, unintentional weight loss, skin rash, eye pain, sinus congestion and sinus pain, sore throat, dysphagia,  hemoptysis , cough, dyspnea, wheezing, chest pain, palpitations, orthopnea, edema, abdominal pain, nausea, melena, diarrhea, constipation, flank pain, dysuria, hematuria, urinary  Frequency, nocturia, numbness, tingling, seizures,  Focal weakness, Loss of consciousness,  Tremor, insomnia, depression, anxiety, and suicidal ideation.      Objective:  BP 130/86   Pulse 80   Temp 98 F (36.7 C) (Oral)   Wt 164 lb 8 oz (74.6 kg)   SpO2 99%   BMI 28.24 kg/m   BP Readings from Last 3  Encounters:  06/30/16 130/86  05/30/16 118/82  02/21/16 118/78    Wt Readings from Last 3 Encounters:  06/30/16 164 lb 8 oz (74.6 kg)  05/30/16 163 lb 3.2 oz (74 kg)  02/21/16 162 lb (73.5 kg)    General appearance: alert, cooperative and appears stated age Ears: normal TM's and external ear canals both ears Throat: lips, mucosa, and tongue normal; teeth and gums normal Neck: no adenopathy, no carotid bruit, supple, symmetrical, trachea midline and thyroid not enlarged, symmetric, no tenderness/mass/nodules Back: symmetric, no curvature. ROM normal. No CVA tenderness. Lungs: clear to auscultation bilaterally Heart: regular rate and rhythm, S1, S2 normal, no murmur, click, rub or gallop Abdomen: soft, non-tender; bowel sounds normal; no masses,  no organomegaly Pulses: 2+ and symmetric Skin: Skin color, texture, turgor normal. No rashes or lesions Lymph nodes: Cervical, supraclavicular, and axillary nodes normal.  Lab Results  Component Value Date   HGBA1C 5.4 02/11/2016    Lab Results  Component Value Date   CREATININE 0.84 02/11/2016   CREATININE 0.78 02/20/2015   CREATININE 0.85 12/04/2014    Lab Results  Component Value Date   WBC 6.6 02/20/2015   HGB 12.5 02/18/2013   HCT 39.8 02/20/2015   PLT 330 02/20/2015   GLUCOSE 84 02/11/2016   CHOL 140 02/11/2016   TRIG 63 02/11/2016   HDL 60 02/11/2016   LDLCALC 67 02/11/2016   ALT 16 02/11/2016   AST 22 02/11/2016   NA 139 02/11/2016   K 3.9 02/11/2016   CL 104  02/11/2016   CREATININE 0.84 02/11/2016   BUN 12 02/11/2016   CO2 28 02/11/2016   TSH 3.26 02/11/2016   HGBA1C 5.4 02/11/2016   MICROALBUR <0.7 02/11/2016    Mm Screening Breast Tomo Bilateral  Result Date: 10/01/2015 CLINICAL DATA:  Screening. EXAM: 2D DIGITAL SCREENING BILATERAL MAMMOGRAM WITH CAD AND ADJUNCT TOMO COMPARISON:  Previous exam(s). ACR Breast Density Category b: There are scattered areas of fibroglandular density. FINDINGS: There are no  findings suspicious for malignancy. Images were processed with CAD. IMPRESSION: No mammographic evidence of malignancy. A result letter of this screening mammogram will be mailed directly to the patient. RECOMMENDATION: Screening mammogram in one year. (Code:SM-B-01Y) BI-RADS CATEGORY  1: Negative. Electronically Signed   By: Frederico Hamman M.D.   On: 10/01/2015 13:29    Assessment & Plan:   Problem List Items Addressed This Visit    GAD (generalized anxiety disorder)    Secondary to financial pressures and Adding alprazolam for insomnia encouraged to begin  trial of lexapro          I am having Ms. Eardley start on traMADol, celecoxib, cyclobenzaprine, and escitalopram. I am also having her maintain her cetirizine and ALPRAZolam.  Meds ordered this encounter  Medications  . traMADol (ULTRAM) 50 MG tablet    Sig: Take 1 tablet (50 mg total) by mouth every 6 (six) hours as needed.    Dispense:  60 tablet    Refill:  0  . celecoxib (CELEBREX) 200 MG capsule    Sig: Take 1 capsule (200 mg total) by mouth 2 (two) times daily.    Dispense:  60 capsule    Refill:  1  . cyclobenzaprine (FLEXERIL) 10 MG tablet    Sig: Take 1 tablet (10 mg total) by mouth 3 (three) times daily as needed for muscle spasms.    Dispense:  30 tablet    Refill:  0  . escitalopram (LEXAPRO) 10 MG tablet    Sig: Take 1 tablet (10 mg total) by mouth daily.    Dispense:  30 tablet    Refill:  5    There are no discontinued medications.  Follow-up: No Follow-up on file.   Sherlene Shams, MD

## 2016-07-01 ENCOUNTER — Telehealth: Payer: Self-pay | Admitting: Internal Medicine

## 2016-07-01 DIAGNOSIS — M25511 Pain in right shoulder: Secondary | ICD-10-CM | POA: Insufficient documentation

## 2016-07-01 NOTE — Telephone Encounter (Signed)
Pt called about her pharmacy stating that the medication of traMADol (ULTRAM) 50 MG tablet was not authorized. Please advise?  Call pt @ [phone]. Thank you!

## 2016-07-01 NOTE — Assessment & Plan Note (Signed)
Secondary to financial pressures and Adding alprazolam for insomnia encouraged to begin  trial of lexapro

## 2016-07-01 NOTE — Assessment & Plan Note (Signed)
Secondary to muscle spasms, possible underlying muscle treat. NSAIDs , tramadol,  Flexeril

## 2016-07-01 NOTE — Telephone Encounter (Signed)
Spoke with pt and she stated that she received a text from [facility] stating that the medication had been denied. Called the pharmacy and they stated that refill request was sent to [facility] Jose[facility]aroo at [facility] and it was denied by him because pt is no longer under his care. Pharmacy asked that we fax Dr. Melina Schoolsullo's signed rx back to them. Rx has been faxed back to [facility] and pt was notified.

## 2016-08-19 ENCOUNTER — Ambulatory Visit (INDEPENDENT_AMBULATORY_CARE_PROVIDER_SITE_OTHER): Payer: BLUE CROSS/BLUE SHIELD | Admitting: Internal Medicine

## 2016-08-19 ENCOUNTER — Encounter: Payer: Self-pay | Admitting: Internal Medicine

## 2016-08-19 DIAGNOSIS — G479 Sleep disorder, unspecified: Secondary | ICD-10-CM | POA: Diagnosis not present

## 2016-08-19 DIAGNOSIS — M778 Other enthesopathies, not elsewhere classified: Secondary | ICD-10-CM | POA: Diagnosis not present

## 2016-08-19 DIAGNOSIS — F411 Generalized anxiety disorder: Secondary | ICD-10-CM

## 2016-08-19 DIAGNOSIS — M779 Enthesopathy, unspecified: Principal | ICD-10-CM

## 2016-08-19 MED ORDER — PREDNISONE 10 MG PO TABS: ORAL_TABLET | ORAL | 0 refills | Status: DC

## 2016-08-19 MED ORDER — ALPRAZOLAM 0.25 MG PO TABS: 0.2500 mg | ORAL_TABLET | Freq: Every evening | ORAL | 0 refills | Status: DC | PRN

## 2016-08-19 MED ORDER — TRAMADOL HCL 50 MG PO TABS: 50.0000 mg | ORAL_TABLET | Freq: Four times a day (QID) | ORAL | 0 refills | Status: DC | PRN

## 2016-08-19 MED ORDER — CELECOXIB 200 MG PO CAPS: 200.0000 mg | ORAL_CAPSULE | Freq: Two times a day (BID) | ORAL | 1 refills | Status: DC

## 2016-08-19 NOTE — Progress Notes (Signed)
Subjective:  Patient ID: Cynthia Ruiz, female    DOB: Feb 18, 1966  Age: 51 y.o. MRN: [id number]  CC: Diagnoses of Thumb tendonitis, Sleep disturbance, and GAD (generalized anxiety disorder) were pertinent to this visit.  HPI Cynthia Ruiz presents for 1) evaluation of painful left thumb .  She is right handed. Unusual activity last weekend: used an [employer] hedge trimmer for a few hours .  Thumb has been diffusely swollen for the past week,  Has been unable to flex the distal phalynx   for the last 2 days due to swelling.   Has been wearing the brace at night,   2) follow up on Anxiety: never started lexapro or xanax  After her last visit, but feels that she needs to start  Because she has been overwhelmed,  Irritable, not sleeping well. Special needs daughter with Down's Syndrome entering  puberty.Marland Kitchen.  3) 2 episodes of stomach feeling  unsettled,   No appetite,  with several loose stools  This morning  Which would make the stomach feel better for a few hours.  Symptoms have been present  for the last 24 hours. Previous  episode occurred last week.  Started a new diet last week before this started.   Has lost 6 lbs thus far .    4) Needs gyn  Exam.  Has entered menopause  Outpatient Medications Prior to Visit  Medication Sig Dispense Refill  . cetirizine (ZYRTEC) 5 MG tablet Take 10 mg by mouth daily.    Marland Kitchen. ALPRAZolam (XANAX) 0.25 MG tablet Take 1 tablet (0.25 mg total) by mouth at bedtime as needed for sleep. (Patient not taking: Reported on 08/19/2016) 20 tablet 0  . celecoxib (CELEBREX) 200 MG capsule Take 1 capsule (200 mg total) by mouth 2 (two) times daily. (Patient not taking: Reported on 08/19/2016) 60 capsule 1  . cyclobenzaprine (FLEXERIL) 10 MG tablet Take 1 tablet (10 mg total) by mouth 3 (three) times daily as needed for muscle spasms. (Patient not taking: Reported on 08/19/2016) 30 tablet 0  . escitalopram (LEXAPRO) 10 MG tablet Take 1 tablet (10 mg total) by mouth daily. (Patient not  taking: Reported on 08/19/2016) 30 tablet 5  . traMADol (ULTRAM) 50 MG tablet Take 1 tablet (50 mg total) by mouth every 6 (six) hours as needed. (Patient not taking: Reported on 08/19/2016) 60 tablet 0   No facility-administered medications prior to visit.     Review of Systems;  Patient denies headache, fevers, malaise, unintentional weight loss, skin rash, eye pain, sinus congestion and sinus pain, sore throat, dysphagia,  hemoptysis , cough, dyspnea, wheezing, chest pain, palpitations, orthopnea, edema, abdominal pain, nausea, melena, diarrhea, constipation, flank pain, dysuria, hematuria, urinary  Frequency, nocturia, numbness, tingling, seizures,  Focal weakness, Loss of consciousness,  Tremor, insomnia, depression, anxiety, and suicidal ideation.      Objective:  BP 126/82 (BP Location: Left Arm, Patient Position: Sitting, Cuff Size: Normal)   Pulse 83   Temp 97.8 F (36.6 C) (Oral)   Resp 16   Ht 5\' 4"  (1.626 m)   Wt 161 lb 6.4 oz (73.2 kg)   SpO2 98%   BMI 27.70 kg/m   BP Readings from Last 3 Encounters:  08/19/16 126/82  06/30/16 130/86  05/30/16 118/82    Wt Readings from Last 3 Encounters:  08/19/16 161 lb 6.4 oz (73.2 kg)  06/30/16 164 lb 8 oz (74.6 kg)  05/30/16 163 lb 3.2 oz (74 kg)    General appearance:  alert, cooperative and appears stated age Ears: normal TM's and external ear canals both ears Throat: lips, mucosa, and tongue normal; teeth and gums normal Neck: no adenopathy, no carotid bruit, supple, symmetrical, trachea midline and thyroid not enlarged, symmetric, no tenderness/mass/nodules Back: symmetric, no curvature. ROM normal. No CVA tenderness. Lungs: clear to auscultation bilaterally Heart: regular rate and rhythm, S1, S2 normal, no murmur, click, rub or gallop Abdomen: soft, non-tender; bowel sounds normal; no masses,  no organomegaly Pulses: 2+ and symmetric Skin: Skin color, texture, turgor normal. No rashes or lesions Lymph nodes: Cervical,  supraclavicular, and axillary nodes normal.  Lab Results  Component Value Date   HGBA1C 5.4 02/11/2016    Lab Results  Component Value Date   CREATININE 0.84 02/11/2016   CREATININE 0.78 02/20/2015   CREATININE 0.85 12/04/2014    Lab Results  Component Value Date   WBC 6.6 02/20/2015   HGB 12.5 02/18/2013   HCT 39.8 02/20/2015   PLT 330 02/20/2015   GLUCOSE 84 02/11/2016   CHOL 140 02/11/2016   TRIG 63 02/11/2016   HDL 60 02/11/2016   LDLCALC 67 02/11/2016   ALT 16 02/11/2016   AST 22 02/11/2016   NA 139 02/11/2016   K 3.9 02/11/2016   CL 104 02/11/2016   CREATININE 0.84 02/11/2016   BUN 12 02/11/2016   CO2 28 02/11/2016   TSH 3.26 02/11/2016   HGBA1C 5.4 02/11/2016   MICROALBUR <0.7 02/11/2016    Mm Screening Breast Tomo Bilateral  Result Date: 10/01/2015 CLINICAL DATA:  Screening. EXAM: 2D DIGITAL SCREENING BILATERAL MAMMOGRAM WITH CAD AND ADJUNCT TOMO COMPARISON:  Previous exam(s). ACR Breast Density Category b: There are scattered areas of fibroglandular density. FINDINGS: There are no findings suspicious for malignancy. Images were processed with CAD. IMPRESSION: No mammographic evidence of malignancy. A result letter of this screening mammogram will be mailed directly to the patient. RECOMMENDATION: Screening mammogram in one year. (Code:SM-B-01Y) BI-RADS CATEGORY  1: Negative. Electronically Signed   By: Frederico Hamman M.D.   On: 10/01/2015 13:29    Assessment & Plan:   Problem List Items Addressed This Visit    Thumb tendonitis    Secondary to prolonged use of hedge trimmer. Prednisone taper,  Then celebrex.       Sleep disturbance    recurrent, secondary to anxiety .  prn alprazolam. The risks and benefits of benzodiazepine use were discussed with patient today including excessive sedation leading to respiratory depression,  impaired thinking/driving, and addiction.  Patient was advised to avoid concurrent use with alcohol, to use medication only as  needed and not to share with others  .       GAD (generalized anxiety disorder)    Secondary to financial pressures .   encouraged to begin  trial of lexapro         A total of 25 minutes of face to face time was spent with patient more than half of which was spent in counselling about the above mentioned conditions  and coordination of care  I have discontinued Ms. Starzyk cyclobenzaprine and escitalopram. I am also having her start on predniSONE. Additionally, I am having her maintain her cetirizine, celecoxib, traMADol, and ALPRAZolam.  Meds ordered this encounter  Medications  . predniSONE (DELTASONE) 10 MG tablet    Sig: 6 tablets on Day 1 , then reduce by 1 tablet daily until gone    Dispense:  21 tablet    Refill:  0  . celecoxib (CELEBREX) 200  MG capsule    Sig: Take 1 capsule (200 mg total) by mouth 2 (two) times daily.    Dispense:  60 capsule    Refill:  1  . traMADol (ULTRAM) 50 MG tablet    Sig: Take 1 tablet (50 mg total) by mouth every 6 (six) hours as needed.    Dispense:  60 tablet    Refill:  0  . ALPRAZolam (XANAX) 0.25 MG tablet    Sig: Take 1 tablet (0.25 mg total) by mouth at bedtime as needed for sleep.    Dispense:  20 tablet    Refill:  0    Medications Discontinued During This Encounter  Medication Reason  . traMADol (ULTRAM) 50 MG tablet Patient has not taken in last 30 days  . escitalopram (LEXAPRO) 10 MG tablet Patient has not taken in last 30 days  . cyclobenzaprine (FLEXERIL) 10 MG tablet Patient has not taken in last 30 days  . celecoxib (CELEBREX) 200 MG capsule Patient has not taken in last 30 days  . ALPRAZolam (XANAX) 0.25 MG tablet Reorder    Follow-up: No Follow-up on file.   Sherlene Shams, MD

## 2016-08-19 NOTE — Patient Instructions (Addendum)
You have tendonitis form yard work!!  I am treating your with a prednisone taper to save you a shot  6 tablets all at once on Day 1,  Then taper by 1 tablet daily until gone.  TAKE IN THE MORNING ONLY    For your anxiety:  The lexapro is non hbit forming and is used DAILY ,  Full  effect I 2 weeks,  Please start the Lexapro (escitalopram) at 1/2 tablet daily in the evening for the first few days to avoid nausea.  You can increase to a full tablet after 4 days if you havenot developed side effects of nausea.  If the lexapro interferes with your sleep, take it in the morning instead\  The alprazolam  is for panic, or insomnia and is PRN BC IT CAN BE HABIT FORMING   Diet for Irritable Bowel Syndrome When you have irritable bowel syndrome (IBS), the foods you eat and your eating habits are very important. IBS may cause various symptoms, such as abdominal pain, constipation, or diarrhea. Choosing the right foods can help ease discomfort caused by these symptoms. Work with your health care provider and dietitian to find the best eating plan to help control your symptoms. What general guidelines do I need to follow?  Keep a food diary. This will help you identify foods that cause symptoms. Write down:  What you eat and when.  What symptoms you have.  When symptoms occur in relation to your meals.  Avoid foods that cause symptoms. Talk with your dietitian about other ways to get the same nutrients that are in these foods.  Eat more foods that contain fiber. Take a fiber supplement if directed by your dietitian.  Eat your meals slowly, in a relaxed setting.  Aim to eat 5-6 small meals per day. Do not skip meals.  Drink enough fluids to keep your urine clear or pale yellow.  Ask your health care provider if you should take an over-the-counter probiotic during flare-ups to help restore healthy gut bacteria.  If you have cramping or diarrhea, try making your meals low in fat and high in  carbohydrates. Examples of carbohydrates are pasta, rice, whole grain breads and cereals, fruits, and vegetables.  If dairy products cause your symptoms to flare up, try eating less of them. You might be able to handle yogurt better than other dairy products because it contains bacteria that help with digestion. What foods are not recommended? The following are some foods and drinks that may worsen your symptoms:  Fatty foods, such as [place] fries.  Milk products, such as cheese or ice cream.  Chocolate.  Alcohol.  Products with caffeine, such as coffee.  Carbonated drinks, such as soda. The items listed above may not be a complete list of foods and beverages to avoid. Contact your dietitian for more information.  What foods are good sources of fiber? Your health care provider or dietitian may recommend that you eat more foods that contain fiber. Fiber can help reduce constipation and other IBS symptoms. Add foods with fiber to your diet a little at a time so that your body can get used to them. Too much fiber at once might cause gas and swelling of your abdomen. The following are some foods that are good sources of fiber:  Apples.  Peaches.  Pears.  Berries.  Figs.  Broccoli (raw).  Cabbage.  Carrots.  Raw peas.  Kidney beans.  Lima beans.  Whole grain bread.  Whole grain cereal. Where  to find more information: [employer] development [employer] for Functional Gastrointestinal Disorders: www.iffgd.[employer] of Diabetes and Digestive and Kidney Diseases: http://norris-lawson.com/.aspx This information is not intended to replace advice given to you by your health care provider. Make sure you discuss any questions you have with your health care provider. Document Released: 06/21/2003 Document Revised: 09/06/2015 Document Reviewed: 07/01/2013 Elsevier Interactive Patient Education  2017 [employer].

## 2016-08-21 NOTE — Assessment & Plan Note (Signed)
recurrent, secondary to anxiety .  prn alprazolam. The risks and benefits of benzodiazepine use were discussed with patient today including excessive sedation leading to respiratory depression,  impaired thinking/driving, and addiction.  Patient was advised to avoid concurrent use with alcohol, to use medication only as needed and not to share with others  .

## 2016-08-21 NOTE — Assessment & Plan Note (Signed)
Secondary to prolonged use of hedge trimmer. Prednisone taper,  Then celebrex.

## 2016-08-21 NOTE — Assessment & Plan Note (Signed)
Secondary to financial pressures .   encouraged to begin  trial of lexapro

## 2016-09-15 ENCOUNTER — Other Ambulatory Visit: Payer: Self-pay | Admitting: Internal Medicine

## 2016-09-15 DIAGNOSIS — Z1231 Encounter for screening mammogram for malignant neoplasm of breast: Secondary | ICD-10-CM

## 2016-09-26 DIAGNOSIS — M65312 Trigger thumb, left thumb: Secondary | ICD-10-CM | POA: Diagnosis not present

## 2016-10-07 ENCOUNTER — Ambulatory Visit
Admission: RE | Admit: 2016-10-07 | Discharge: 2016-10-07 | Disposition: A | Discharge status: Home / Self Care | Payer: BLUE CROSS/BLUE SHIELD | Source: Ambulatory Visit | Attending: Internal Medicine | Admitting: Internal Medicine

## 2016-10-07 DIAGNOSIS — Z1231 Encounter for screening mammogram for malignant neoplasm of breast: Secondary | ICD-10-CM | POA: Insufficient documentation

## 2016-10-24 DIAGNOSIS — M65312 Trigger thumb, left thumb: Secondary | ICD-10-CM | POA: Diagnosis not present

## 2016-11-08 IMAGING — CR DG FINGER THUMB 2+V*L*
1 series · 3 of 3 positions shown · non-contrast
Comparison: None.

CLINICAL DATA: Left thumb pain for the past 3 weeks, worse for the
past 3 days. No known injury.

EXAM:
LEFT THUMB 2+V

[Series 1: dg finger thumb left · 0.14mm/px · 3 of 3 slices shown]
[im 1/3]
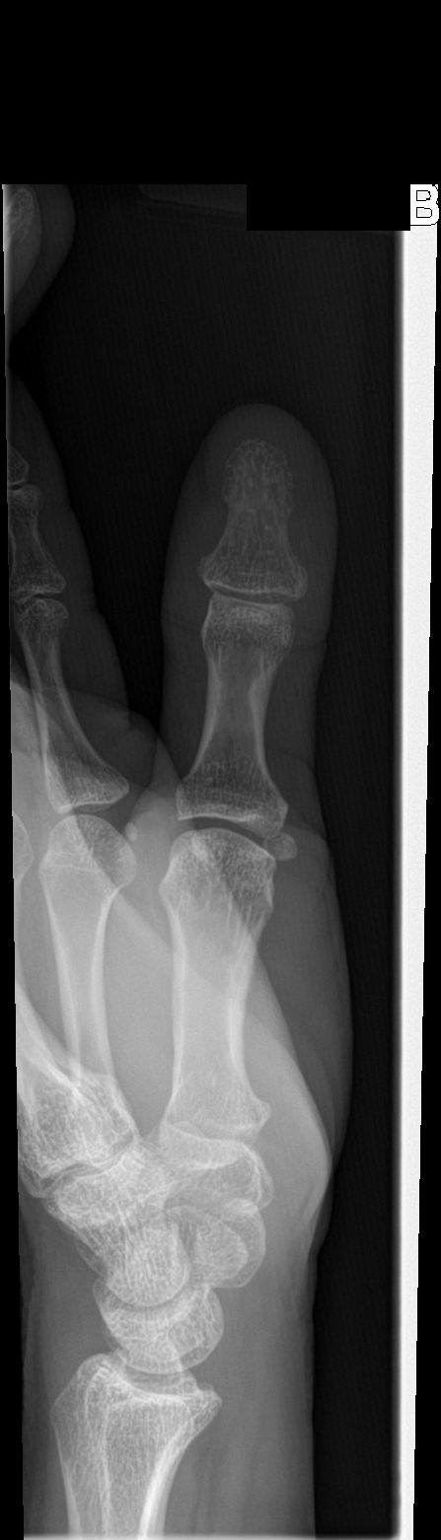
[im 2/3]
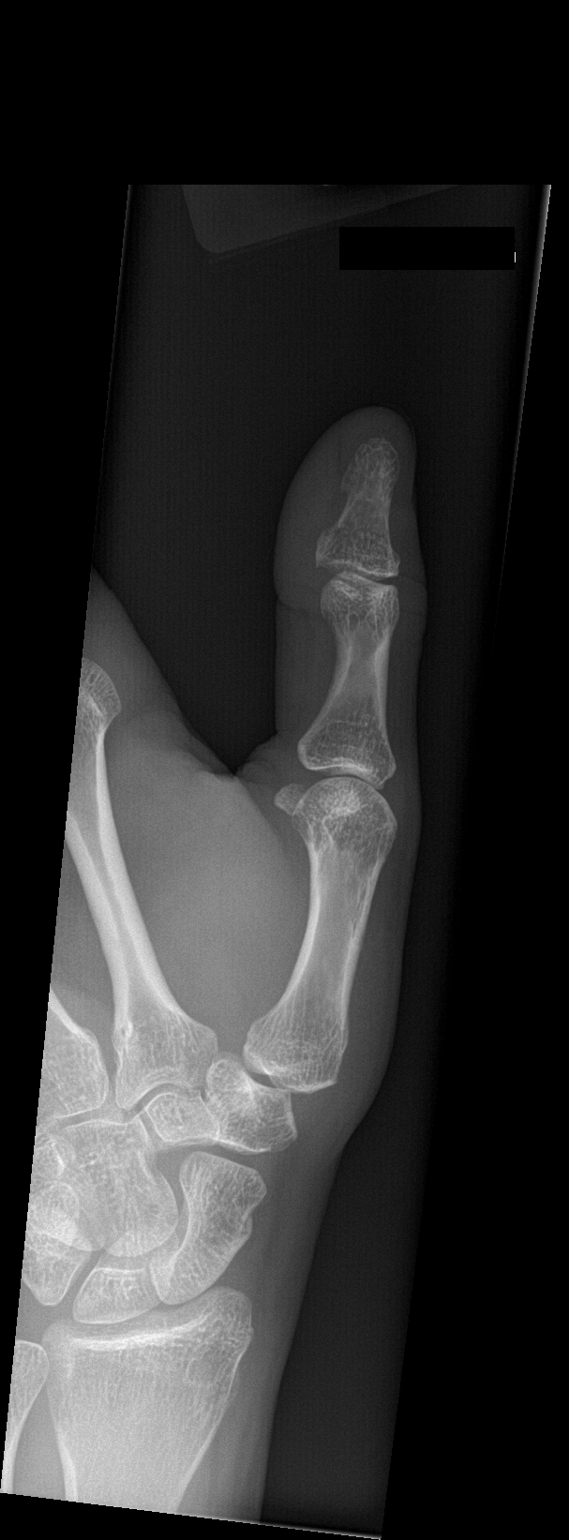
[im 3/3]
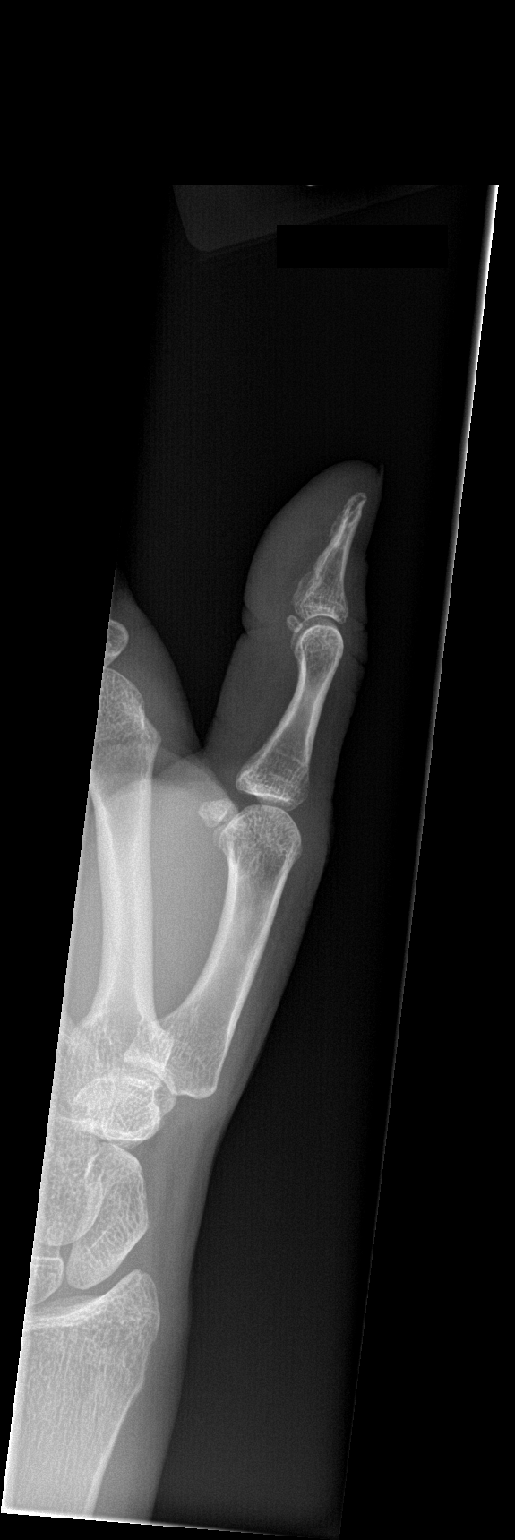

[3 of 3 positions shown; findings below may reference images not displayed]

FINDINGS: There is no evidence of fracture or dislocation. There is no
evidence of arthropathy or other focal bone abnormality. Soft
tissues are unremarkable
IMPRESSION: Normal examination.

## 2016-12-28 DIAGNOSIS — R509 Fever, unspecified: Secondary | ICD-10-CM | POA: Diagnosis not present

## 2016-12-28 DIAGNOSIS — B349 Viral infection, unspecified: Secondary | ICD-10-CM | POA: Diagnosis not present

## 2017-01-12 ENCOUNTER — Encounter: Payer: Self-pay | Admitting: Family

## 2017-01-12 ENCOUNTER — Ambulatory Visit (INDEPENDENT_AMBULATORY_CARE_PROVIDER_SITE_OTHER): Payer: BLUE CROSS/BLUE SHIELD | Admitting: Family

## 2017-01-12 VITALS — BP 126/78 | HR 75 | Temp 98.1°F | Ht 64.0 in | Wt 162.8 lb

## 2017-01-12 DIAGNOSIS — H60502 Unspecified acute noninfective otitis externa, left ear: Secondary | ICD-10-CM

## 2017-01-12 LAB — POCT INFLUENZA A/B
INFLUENZA A, POC: NEGATIVE
Influenza B, POC: NEGATIVE

## 2017-01-12 MED ORDER — OFLOXACIN 0.3 % OT SOLN: OTIC | 0 refills | Status: DC

## 2017-01-12 NOTE — Patient Instructions (Addendum)
Start flonase, zyrtec  Continue ofloxacin   Considering  otitis externa and/or eustachian tube dysfunction.   Rest   Let me know if not better!

## 2017-01-12 NOTE — Progress Notes (Signed)
Pre visit review using our clinic review tool, if applicable. No additional management support is needed unless otherwise documented below in the visit note. 

## 2017-01-12 NOTE — Progress Notes (Signed)
Subjective:    Patient ID: [name], female    DOB: 11/22/1965, 51 y.o.   MRN: [id number]  CC: MONIKE BRAGDON is a 51 y.o. female who presents today for an acute visit.    HPI: CC: ear fullness x one day.  Yesterday had body aches, fever ( tmax 99.40F), nasal congestion.  Not sure if ever got better since zpak.  No cough, sinus pain,  sore throat, drainage from ears, vision changes, HA, dizziness  Started oflaxacin ear drops at home yesterday and today, with some improvement. Took sudafed today.   SInus congestion, Given zpak at [facility] walk in 9/15  and no improvement. Notes flu vaccine 3 days prior.   H/o tympanostomy ( Dr Willeen Cass) , since removed and left TM open.   H/o seasonal allergies, takes zrytec daily  Works in [employer].     HISTORY:  Past Medical History:  Diagnosis Date  . Anxiety    Past Surgical History:  Procedure Laterality Date  . CESAREAN SECTION  1991, 2002, 2007  . COLONOSCOPY WITH PROPOFOL N/A 07/18/2015   Procedure: COLONOSCOPY WITH PROPOFOL;  Surgeon: Earline Mayotte, MD;  Location: [facility] ENDOSCOPY;  Service: Endoscopy;  Laterality: N/A;  . KNEE ARTHROSCOPY Right 1991  . Pyloric Stenosis surgery    . TUBAL LIGATION  2007   Family History  Problem Relation Age of Onset  . Arthritis Mother 53       rheumatoid arthritis - died 28  . Hypertension Father   . Glaucoma Father   . Cancer Father 52       multiple colon polyps,  not CA   . Multiple sclerosis Sister 23  . Diabetes Paternal Grandfather   . Down syndrome Daughter   . Cancer Maternal Grandmother        bile duct  . Cancer Paternal Grandmother        unsure what type  . Breast cancer Neg Hx     Allergies: Patient has no known allergies. Current Outpatient Prescriptions on File Prior to Visit  Medication Sig Dispense Refill  . ALPRAZolam (XANAX) 0.25 MG tablet Take 1 tablet (0.25 mg total) by mouth at bedtime as needed for sleep. 20 tablet 0  . celecoxib (CELEBREX) 200  MG capsule Take 1 capsule (200 mg total) by mouth 2 (two) times daily. 60 capsule 1  . cetirizine (ZYRTEC) 5 MG tablet Take 10 mg by mouth daily.    . predniSONE (DELTASONE) 10 MG tablet 6 tablets on Day 1 , then reduce by 1 tablet daily until gone 21 tablet 0  . traMADol (ULTRAM) 50 MG tablet Take 1 tablet (50 mg total) by mouth every 6 (six) hours as needed. 60 tablet 0   No current facility-administered medications on file prior to visit.     Social History  Substance Use Topics  . Smoking status: Never Smoker  . Smokeless tobacco: Never Used  . Alcohol use Yes     Comment: Occasionally     Review of Systems  Constitutional: Negative for chills and fever.  HENT: Positive for congestion (clear, runny) and ear pain. Negative for ear discharge, sinus pain, sinus pressure and sore throat.   Eyes: Negative for visual disturbance.  Respiratory: Negative for cough and shortness of breath.   Cardiovascular: Negative for chest pain and palpitations.  Gastrointestinal: Negative for nausea and vomiting.  Neurological: Negative for dizziness and headaches.      Objective:    BP 126/78  Pulse 75   Temp 98.1 F (36.7 C) (Oral)   Ht  (1.626 m)   Wt 162 lb 12.8 oz (73.8 kg)   SpO2 95%   BMI 27.94 kg/m    Physical Exam  Constitutional: She appears well-developed and well-nourished.  HENT:  Head: Normocephalic and atraumatic.  Right Ear: Hearing, tympanic membrane, external ear and ear canal normal. No drainage, swelling or tenderness. No foreign bodies. Tympanic membrane is not erythematous and not bulging. No middle ear effusion. No decreased hearing is noted.  Left Ear: Hearing, external ear and ear canal normal. No drainage, swelling or tenderness. No foreign bodies. Tympanic membrane is perforated. Tympanic membrane is not erythematous and not bulging. A middle ear effusion is present. No decreased hearing is noted.  Nose: Nose normal. No rhinorrhea. Right sinus exhibits no  maxillary sinus tenderness and no frontal sinus tenderness. Left sinus exhibits no maxillary sinus tenderness and no frontal sinus tenderness.  Mouth/Throat: Uvula is midline, oropharynx is clear and moist and mucous membranes are normal. No oropharyngeal exudate, posterior oropharyngeal edema, posterior oropharyngeal erythema or tonsillar abscesses.  Eyes: Conjunctivae are normal.  Cardiovascular: Regular rhythm, normal heart sounds and normal pulses.   Pulmonary/Chest: Effort normal and breath sounds normal. She has no wheezes. She has no rhonchi. She has no rales.  Lymphadenopathy:       Head (right side): No submental, no submandibular, no tonsillar, no preauricular, no posterior auricular and no occipital adenopathy present.       Head (left side): No submental, no submandibular, no tonsillar, no preauricular, no posterior auricular and no occipital adenopathy present.    She has no cervical adenopathy.  Neurological: She is alert.  Skin: Skin is warm and dry.  Psychiatric: She has a normal mood and affect. Her speech is normal and behavior is normal. Thought content normal.  Vitals reviewed.      Assessment & Plan:   1. Acute otitis externa of left ear, unspecified type Afebrile. Well appearing. DDx include otitis externa, eustachian tube dysfunction Flu negative Advised to continue floxacin; start flonase and zyrtec.  Return precautions given   - ofloxacin (FLOXIN) 0.3 % OTIC solution; 10 gtts daily in affected ear(s) for 7 days.  Dispense: 4 mL; Refill: 0    I am having Ms. Bo maintain her cetirizine, predniSONE, celecoxib, traMADol, and ALPRAZolam.   No orders of the defined types were placed in this encounter.   Return precautions given.   Risks, benefits, and alternatives of the medications and treatment plan prescribed today were discussed, and patient expressed understanding.   Education regarding symptom management and diagnosis given to patient on  AVS.  Continue to follow with Sherlene Shams, MD for routine health maintenance.   [name] and I agreed with plan.   Rennie Plowman, FNP

## 2017-03-12 ENCOUNTER — Encounter: Payer: Self-pay | Admitting: Obstetrics and Gynecology

## 2017-03-12 ENCOUNTER — Ambulatory Visit (INDEPENDENT_AMBULATORY_CARE_PROVIDER_SITE_OTHER): Payer: BLUE CROSS/BLUE SHIELD | Admitting: Obstetrics and Gynecology

## 2017-03-12 VITALS — BP 138/72 | HR 85 | Ht 64.0 in | Wt 162.1 lb

## 2017-03-12 DIAGNOSIS — N941 Unspecified dyspareunia: Secondary | ICD-10-CM | POA: Diagnosis not present

## 2017-03-12 DIAGNOSIS — Z01419 Encounter for gynecological examination (general) (routine) without abnormal findings: Secondary | ICD-10-CM | POA: Diagnosis not present

## 2017-03-12 NOTE — Patient Instructions (Signed)
Menopause Menopause is the normal time of life when menstrual periods stop completely. Menopause is complete when you have missed 12 consecutive menstrual periods. It usually occurs between the ages of 48 years and 55 years. Very rarely does a woman develop menopause before the age of 40 years. At menopause, your ovaries stop producing the female hormones estrogen and progesterone. This can cause undesirable symptoms and also affect your health. Sometimes the symptoms may occur 4-5 years before the menopause begins. There is no relationship between menopause and:  Oral contraceptives.  Number of children you had.  Race.  The age your menstrual periods started (menarche).  Heavy smokers and very thin women may develop menopause earlier in life. What are the causes?  The ovaries stop producing the female hormones estrogen and progesterone. Other causes include:  Surgery to remove both ovaries.  The ovaries stop functioning for no known reason.  Tumors of the pituitary gland in the brain.  Medical disease that affects the ovaries and hormone production.  Radiation treatment to the abdomen or pelvis.  Chemotherapy that affects the ovaries.  What are the signs or symptoms?  Hot flashes.  Night sweats.  Decrease in sex drive.  Vaginal dryness and thinning of the vagina causing painful intercourse.  Dryness of the skin and developing wrinkles.  Headaches.  Tiredness.  Irritability.  Memory problems.  Weight gain.  Bladder infections.  Hair growth of the face and chest.  Infertility. More serious symptoms include:  Loss of bone (osteoporosis) causing breaks (fractures).  Depression.  Hardening and narrowing of the arteries (atherosclerosis) causing heart attacks and strokes.  How is this diagnosed?  When the menstrual periods have stopped for 12 straight months.  Physical exam.  Hormone studies of the blood. How is this treated? There are many treatment  choices and nearly as many questions about them. The decisions to treat or not to treat menopausal changes is an individual choice made with your health care provider. Your health care provider can discuss the treatments with you. Together, you can decide which treatment will work best for you. Your treatment choices may include:  Hormone therapy (estrogen and progesterone).  Non-hormonal medicines.  Treating the individual symptoms with medicine (for example antidepressants for depression).  Herbal medicines that may help specific symptoms.  Counseling by a psychiatrist or psychologist.  Group therapy.  Lifestyle changes including: ? Eating healthy. ? Regular exercise. ? Limiting caffeine and alcohol. ? Stress management and meditation.  No treatment.  Follow these instructions at home:  Take the medicine your health care provider gives you as directed.  Get plenty of sleep and rest.  Exercise regularly.  Eat a diet that contains calcium (good for the bones) and soy products (acts like estrogen hormone).  Avoid alcoholic beverages.  Do not smoke.  If you have hot flashes, dress in layers.  Take supplements, calcium, and vitamin D to strengthen bones.  You can use over-the-counter lubricants or moisturizers for vaginal dryness.  Group therapy is sometimes very helpful.  Acupuncture may be helpful in some cases. Contact a health care provider if:  You are not sure you are in menopause.  You are having menopausal symptoms and need advice and treatment.  You are still having menstrual periods after age 55 years.  You have pain with intercourse.  Menopause is complete (no menstrual period for 12 months) and you develop vaginal bleeding.  You need a referral to a specialist (gynecologist, psychiatrist, or psychologist) for treatment. Get help right   away if:  You have severe depression.  You have excessive vaginal bleeding.  You fell and think you have a  broken bone.  You have pain when you urinate.  You develop leg or chest pain.  You have a fast pounding heart beat (palpitations).  You have severe headaches.  You develop vision problems.  You feel a lump in your breast.  You have abdominal pain or severe indigestion. This information is not intended to replace advice given to you by your health care provider. Make sure you discuss any questions you have with your health care provider. Document Released: 06/21/2003 Document Revised: 09/06/2015 Document Reviewed: 10/28/2012 Elsevier Interactive Patient Education  2017 [employer] Inc[employer]  

## 2017-03-12 NOTE — Progress Notes (Signed)
Subjective:   Cynthia AuerbachDeborah M Ruiz is a 51 y.o. G3P3 Caucasian female here for a routine well-woman exam.  No LMP recorded. Patient is not currently having periods (Reason: Perimenopausal).    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  Past Medical History Past Medical History:  Diagnosis Date  . Anxiety     Past Surgical History Past Surgical History:  Procedure Laterality Date  . CESAREAN SECTION  1991, 2002, 2007  . COLONOSCOPY WITH PROPOFOL N/A 07/18/2015   Procedure: COLONOSCOPY WITH PROPOFOL;  Surgeon: Earline MayotteJeffrey W Byrnett, MD;  [facility];  Service: Endoscopy;  Laterality: N/A;  . KNEE ARTHROSCOPY Right 1991  . Pyloric Stenosis surgery    . TUBAL LIGATION  2007    Gynecologic History G3P3  No LMP recorded. Patient is not currently having periods (Reason: Perimenopausal). Contraception: post menopausal status Last Pap: 2014. Results were: normal Last mammogram: 09/2016. Results were: normal   Obstetric History OB History  Gravida Para Term Preterm AB Living  3 3          SAB TAB Ectopic Multiple Live Births               # Outcome Date GA Lbr Len/2nd Weight Sex Delivery Anes PTL Lv  3 Para           2 Para           1 Para             Obstetric Comments  1st Menstrual Cycle:  10   1st Pregnancy:  24    Current Medications Current Outpatient Medications on File Prior to Visit  Medication Sig Dispense Refill  . cetirizine (ZYRTEC) 5 MG tablet Take 10 mg by mouth  daily.     No current facility-administered medications on file prior to visit.     Review of Systems Patient denies any headaches, blurred vision, shortness of breath, chest pain, abdominal pain, problems with bowel movements, urination, or intercourse.  Objective:  BP 138/72   Pulse 85   Ht 5\' 4"  (1.626 m)   Wt 162 lb 1.6 oz (73.5 kg)   BMI 27.82 kg/m  Physical Exam  General:  Well developed, well nourished, no acute distress. She is alert and oriented x3. Skin:  Warm and dry Neck:  Midline trachea, no thyromegaly or nodules Cardiovascular: Regular rate and rhythm, no murmur heard Lungs:  Effort normal, all lung fields clear to auscultation bilaterally Breasts:  No dominant palpable mass, retraction, or nipple discharge Abdomen:  Soft, non tender, no hepatosplenomegaly or masses Pelvic:  External genitalia is normal but slightly atrophic in appearance.  The vagina is normal in appearance. The cervix is bulbous, no CMT.  Thin prep pap is done with HR HPV cotesting. Uterus is felt to be normal size, shape, and contour.  No adnexal masses or tenderness noted. Extremities:  No swelling or varicosities noted Psych:  She has a normal mood and affect  Assessment:   Healthy well-woman exam dysparenia Plan:  Recommend coconut oil for  vaginal lubricant F/U 1 year for AE, or sooner if needed   Melody Suzan NailerN Shambley, CNM

## 2017-03-13 ENCOUNTER — Other Ambulatory Visit: Payer: Self-pay | Admitting: Obstetrics and Gynecology

## 2017-03-13 DIAGNOSIS — Z01419 Encounter for gynecological examination (general) (routine) without abnormal findings: Secondary | ICD-10-CM | POA: Diagnosis not present

## 2017-03-13 LAB — COMPREHENSIVE METABOLIC PANEL
A/G RATIO: 1.7 (ref 1.2–2.2)
ALT: 18 IU/L (ref 0–32)
AST: 25 IU/L (ref 0–40)
Albumin: 4.7 g/dL (ref 3.5–5.5)
Alkaline Phosphatase: 87 IU/L (ref 39–117)
BUN/Creatinine Ratio: 19 (ref 9–23)
BUN: 16 mg/dL (ref 6–24)
CHLORIDE: 101 mmol/L (ref 96–106)
CO2: 25 mmol/L (ref 20–29)
Calcium: 9.3 mg/dL (ref 8.7–10.2)
Creatinine, Ser: 0.85 mg/dL (ref 0.57–1.00)
GFR calc Af Amer: 90 mL/min/{1.73_m2} (ref >59)
GFR calc non Af Amer: 78 mL/min/{1.73_m2} (ref >59)
Globulin, Total: 2.8 g/dL (ref 1.5–4.5)
Glucose: 94 mg/dL (ref 65–99)
POTASSIUM: 4.4 mmol/L (ref 3.5–5.2)
SODIUM: 140 mmol/L (ref 134–144)
Total Protein: 7.5 g/dL (ref 6.0–8.5)

## 2017-03-13 LAB — LIPID PANEL
CHOL/HDL RATIO: 2.1 ratio (ref 0.0–4.4)
Cholesterol, Total: 128 mg/dL (ref 100–199)
HDL: 60 mg/dL (ref >39)
LDL CALC: 53 mg/dL (ref 0–99)
TRIGLYCERIDES: 77 mg/dL (ref 0–149)
VLDL Cholesterol Cal: 15 mg/dL (ref 5–40)

## 2017-03-13 LAB — TSH: TSH: 3.64 u[IU]/mL (ref 0.450–4.500)

## 2017-03-13 LAB — HEMOGLOBIN A1C
ESTIMATED AVERAGE GLUCOSE: 114 mg/dL
Hgb A1c MFr Bld: 5.6 % (ref 4.8–5.6)

## 2017-03-13 LAB — VITAMIN D 25 HYDROXY (VIT D DEFICIENCY, FRACTURES): Vit D, 25-Hydroxy: 39.1 ng/mL (ref 30.0–100.0)

## 2017-03-17 LAB — CYTOLOGY - PAP

## 2017-04-13 DIAGNOSIS — M9902 Segmental and somatic dysfunction of thoracic region: Secondary | ICD-10-CM | POA: Diagnosis not present

## 2017-04-13 DIAGNOSIS — M9901 Segmental and somatic dysfunction of cervical region: Secondary | ICD-10-CM | POA: Diagnosis not present

## 2017-04-13 DIAGNOSIS — M62830 Muscle spasm of back: Secondary | ICD-10-CM | POA: Diagnosis not present

## 2017-04-13 DIAGNOSIS — M5412 Radiculopathy, cervical region: Secondary | ICD-10-CM | POA: Diagnosis not present

## 2017-10-12 ENCOUNTER — Ambulatory Visit: Payer: BLUE CROSS/BLUE SHIELD | Admitting: Internal Medicine

## 2017-10-12 ENCOUNTER — Encounter: Payer: Self-pay | Admitting: Internal Medicine

## 2017-10-12 ENCOUNTER — Other Ambulatory Visit: Payer: Self-pay | Admitting: Internal Medicine

## 2017-10-12 VITALS — BP 116/66 | HR 94 | Temp 97.8°F | Ht 64.0 in | Wt 170.0 lb

## 2017-10-12 DIAGNOSIS — J329 Chronic sinusitis, unspecified: Secondary | ICD-10-CM

## 2017-10-12 DIAGNOSIS — Z1231 Encounter for screening mammogram for malignant neoplasm of breast: Secondary | ICD-10-CM

## 2017-10-12 MED ORDER — AZITHROMYCIN 250 MG PO TABS: ORAL_TABLET | ORAL | 0 refills | Status: DC

## 2017-10-12 NOTE — Patient Instructions (Addendum)
Call in 1 week if not better    Earache, Adult An earache, or ear pain, can be caused by many things, including:  An infection.  Ear wax buildup.  Ear pressure.  Something in the ear that should not be there (foreign body).  A sore throat.  Tooth problems.  Jaw problems.  Treatment of the earache will depend on the cause. If the cause is not clear or cannot be determined, you may need to watch your symptoms until your earache goes away or until a cause is found. Follow these instructions at home: Pay attention to any changes in your symptoms. Take these actions to help with your pain:  Take or apply over-the-counter and prescription medicines only as told by your health care provider.  If you were prescribed an antibiotic medicine, use it as told by your health care provider. Do not stop using the antibiotic even if you start to feel better.  Do not put anything in your ear other than medicine that is prescribed by your health care provider.  If directed, apply heat to the affected area as often as told by your health care provider. Use the heat source that your health care provider recommends, such as a moist heat pack or a heating pad. ? Place a towel between your skin and the heat source. ? Leave the heat on for 20-30 minutes. ? Remove the heat if your skin turns bright red. This is especially important if you are unable to feel pain, heat, or cold. You may have a greater risk of getting burned.  If directed, put ice on the ear: ? Put ice in a plastic bag. ? Place a towel between your skin and the bag. ? Leave the ice on for 20 minutes, 2-3 times a day.  Try resting in an upright position instead of lying down. This may help to reduce pressure in your ear and relieve pain.  Chew gum if it helps to relieve your ear pain.  Treat any allergies as told by your health care provider.  Keep all follow-up visits as told by your health care provider. This is  important.  Contact a health care provider if:  Your pain does not improve within 2 days.  Your earache gets worse.  You have new symptoms.  You have a fever. Get help right away if:  You have a severe headache.  You have a stiff neck.  You have trouble swallowing.  You have redness or swelling behind your ear.  You have fluid or blood coming from your ear.  You have hearing loss.  You feel dizzy. This information is not intended to replace advice given to you by your health care provider. Make sure you discuss any questions you have with your health care provider. Document Released: 11/16/2003 Document Revised: 11/27/2015 Document Reviewed: 09/24/2015 Elsevier Interactive Patient Education  2018 [employer]   Sinusitis, Adult Sinusitis is soreness and inflammation of your sinuses. Sinuses are hollow spaces in the bones around your face. Your sinuses are located:  Around your eyes.  In the middle of your forehead.  Behind your nose.  In your cheekbones.  Your sinuses and nasal passages are lined with a stringy fluid (mucus). Mucus normally drains out of your sinuses. When your nasal tissues become inflamed or swollen, the mucus can become trapped or blocked so air cannot flow through your sinuses. This allows bacteria, viruses, and funguses to grow, which leads to infection. Sinusitis can develop quickly and last  for 7?10 days (acute) or for more than 12 weeks (chronic). Sinusitis often develops after a cold. What are the causes? This condition is caused by anything that creates swelling in the sinuses or stops mucus from draining, including:  Allergies.  Asthma.  Bacterial or viral infection.  Abnormally shaped bones between the nasal passages.  Nasal growths that contain mucus (nasal polyps).  Narrow sinus openings.  Pollutants, such as chemicals or irritants in the air.  A foreign object stuck in the nose.  A fungal infection. This is rare.  What  increases the risk? The following factors may make you more likely to develop this condition:  Having allergies or asthma.  Having had a recent cold or respiratory tract infection.  Having structural deformities or blockages in your nose or sinuses.  Having a weak immune system.  Doing a lot of swimming or diving.  Overusing nasal sprays.  Smoking.  What are the signs or symptoms? The main symptoms of this condition are pain and a feeling of pressure around the affected sinuses. Other symptoms include:  Upper toothache.  Earache.  Headache.  Bad breath.  Decreased sense of smell and taste.  A cough that may get worse at night.  Fatigue.  Fever.  Thick drainage from your nose. The drainage is often green and it may contain pus (purulent).  Stuffy nose or congestion.  Postnasal drip. This is when extra mucus collects in the throat or back of the nose.  Swelling and warmth over the affected sinuses.  Sore throat.  Sensitivity to light.  How is this diagnosed? This condition is diagnosed based on symptoms, a medical history, and a physical exam. To find out if your condition is acute or chronic, your health care provider may:  Look in your nose for signs of nasal polyps.  Tap over the affected sinus to check for signs of infection.  View the inside of your sinuses using an imaging device that has a light attached (endoscope).  If your health care provider suspects that you have chronic sinusitis, you may also:  Be tested for allergies.  Have a sample of mucus taken from your nose (nasal culture) and checked for bacteria.  Have a mucus sample examined to see if your sinusitis is related to an allergy.  If your sinusitis does not respond to treatment and it lasts longer than 8 weeks, you may have an MRI or CT scan to check your sinuses. These scans also help to determine how severe your infection is. In rare cases, a bone biopsy may be done to rule out more  serious types of fungal sinus disease. How is this treated? Treatment for sinusitis depends on the cause and whether your condition is chronic or acute. If a virus is causing your sinusitis, your symptoms will go away on their own within 10 days. You may be given medicines to relieve your symptoms, including:  Topical nasal decongestants. They shrink swollen nasal passages and let mucus drain from your sinuses.  Antihistamines. These drugs block inflammation that is triggered by allergies. This can help to ease swelling in your nose and sinuses.  Topical nasal corticosteroids. These are nasal sprays that ease inflammation and swelling in your nose and sinuses.  Nasal saline washes. These rinses can help to get rid of thick mucus in your nose.  If your condition is caused by bacteria, you will be given an antibiotic medicine. If your condition is caused by a fungus, you will be given an  antifungal medicine. Surgery may be needed to correct underlying conditions, such as narrow nasal passages. Surgery may also be needed to remove polyps. Follow these instructions at home: Medicines  Take, use, or apply over-the-counter and prescription medicines only as told by your health care provider. These may include nasal sprays.  If you were prescribed an antibiotic medicine, take it as told by your health care provider. Do not stop taking the antibiotic even if you start to feel better. Hydrate and Humidify  Drink enough water to keep your urine clear or pale yellow. Staying hydrated will help to thin your mucus.  Use a cool mist humidifier to keep the humidity level in your home above 50%.  Inhale steam for 10-15 minutes, 3-4 times a day or as told by your health care provider. You can do this in the bathroom while a hot shower is running.  Limit your exposure to cool or dry air. Rest  Rest as much as possible.  Sleep with your head raised (elevated).  Make sure to get enough sleep each  night. General instructions  Apply a warm, moist washcloth to your face 3-4 times a day or as told by your health care provider. This will help with discomfort.  Wash your hands often with soap and water to reduce your exposure to viruses and other germs. If soap and water are not available, use hand sanitizer.  Do not smoke. Avoid being around people who are smoking (secondhand smoke).  Keep all follow-up visits as told by your health care provider. This is important. Contact a health care provider if:  You have a fever.  Your symptoms get worse.  Your symptoms do not improve within 10 days. Get help right away if:  You have a severe headache.  You have persistent vomiting.  You have pain or swelling around your face or eyes.  You have vision problems.  You develop confusion.  Your neck is stiff.  You have trouble breathing. This information is not intended to replace advice given to you by your health care provider. Make sure you discuss any questions you have with your health care provider. Document Released: 03/31/2005 Document Revised: 11/25/2015 Document Reviewed: 01/24/2015 Elsevier Interactive Patient Education  [employer].

## 2017-10-12 NOTE — Progress Notes (Signed)
Chief Complaint  Patient presents with  . Sinusitis    nose and ear px   Acute visit  C/o left sinus pain and left ear pain since yesterday tried zyrtec, sudafed PE, Advil. Has not had fever or sick contacts. She also has been sneezing a lot. Has not tried nose sprays    Review of Systems  Constitutional: Negative for fever.  HENT: Positive for ear pain and sinus pain. Negative for sore throat.   Eyes: Negative for blurred vision.  Respiratory: Negative for shortness of breath.   Cardiovascular: Negative for chest pain.  Neurological: Negative for headaches.  Psychiatric/Behavioral:       +stress    Past Medical History:  Diagnosis Date  . Anxiety    Past Surgical History:  Procedure Laterality Date  . CESAREAN SECTION  1991, 2002, 2007  . COLONOSCOPY WITH PROPOFOL N/A 07/18/2015   Procedure: COLONOSCOPY WITH PROPOFOL;  Surgeon: Earline MayotteJeffrey W Byrnett, MD;  [facility];  Service: Endoscopy;  Laterality: N/A;  . KNEE ARTHROSCOPY Right 1991  . Pyloric Stenosis surgery    . TUBAL LIGATION  2007   Family History  Problem Relation Age of Onset  . Arthritis Mother [id number]       rheumatoid arthritis - died 9763  . Hypertension Father   . Glaucoma Father   . Cancer Father 5065       multiple colon polyps,  not CA   . Multiple sclerosis Sister 740  . Diabetes Paternal Grandfather   . Down syndrome Daughter   . Cancer Maternal Grandmother        bile duct  . Cancer Paternal Grandmother        unsure what type  . Breast cancer Neg Hx    Social History   Socioeconomic History  . Marital status: Married    Spouse name: Cynthia Ruiz  . Number of children: 3  . Years of education: 4117  . Highest education level: Not on file  Occupational History  . Occupation: Part time Lawyersubstitute teacher    Comment: [employer]  Social Needs  . Financial resource strain: Not on file  . Food insecurity:    Worry: Not on file    Inability: Not on file  . Transportation  needs:    Medical: Not on file    Non-medical: Not on file  Tobacco Use  . Smoking status: Never Smoker  . Smokeless tobacco: Never Used  Substance and Sexual Activity  . Alcohol use: Yes    Comment: Occasionally   . Drug use: No  . Sexual activity: Yes    Partners: Male    Birth control/protection: Surgical  Lifestyle  . Physical activity:    Days per week: Not on file    Minutes per session: Not on file  . Stress: Not on file  Relationships  . Social connections:    Talks on phone: Not on file    Gets together: Not on file    Attends religious service: Not on file    Active member of club or organization: Not on file    Attends meetings of clubs or organizations: Not on file    Relationship status: Not on file  . Intimate partner violence:    Fear of current or ex partner: Not on file    Emotionally abused: Not on file    Physically abused: Not on file    Forced sexual activity: Not on file  Other Topics Concern  .  Not on file  Social History Narrative   Cynthia Ruiz grew up in [place]. She attended [employer] in [place] and obtained her Bachelors degree in Sociology. 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 She currently lives in [place] with her husband, Cynthia Ruiz, and 2 of their 3 children. She has 1 son who is in the [employer] stationed in [place]. 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 Her main focus is her children. She enjoys going out with her husband and socializing with friends. She also does a lot of volunteer work in [employer]. 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   Current Meds  Medication Sig  . cetirizine (ZYRTEC) 5 MG tablet Take 10 mg by mouth daily.  Marland Kitchen ibuprofen (ADVIL,MOTRIN) 200 MG tablet Take 200 mg by mouth every 6 (six) hours as needed.  . Pseudoephedrine HCl (SUDAFED PO) Take by mouth.  . [DISCONTINUED]  azelastine (ASTELIN) 0.1 % nasal spray Place into the nose.   No Known Allergies No results found for this or any previous visit (from the past 2160 hour(s)). Objective  Body mass index is 29.18 kg/m. Wt Readings from Last 3 Encounters:  10/12/17 170 lb (77.1 kg)  03/12/17 162 lb 1.6 oz (73.5 kg)  01/12/17 162 lb 12.8 oz (73.8 kg)   Temp Readings from Last 3 Encounters:  10/12/17 97.8 F (36.6 C) (Oral)  01/12/17 98.1 F (36.7 C) (Oral)  08/19/16 97.8 F (36.6 C) (Oral)   BP Readings from Last 3 Encounters:  10/12/17 116/66  03/12/17 138/72  01/12/17 126/78   Pulse Readings from Last 3 Encounters:  10/12/17 94  03/12/17 85  01/12/17 75    Physical Exam  Constitutional: She is oriented to person, place, and time. Vital signs are normal. She appears well-developed and well-nourished. She is cooperative.  HENT:  Head: Normocephalic and atraumatic.  Nose: Right sinus exhibits no maxillary sinus tenderness and no frontal sinus tenderness. Left sinus exhibits maxillary sinus tenderness. Left sinus exhibits no frontal sinus tenderness.  Mouth/Throat: Oropharynx is clear and moist and mucous membranes are normal.  Eyes: Pupils are equal, round, and reactive to light. Conjunctivae are normal.  Cardiovascular: Normal rate and regular rhythm.  Pulmonary/Chest: Effort normal and breath sounds normal.  Neurological: She is alert and oriented to person, place, and time. Gait normal.  Skin: Skin is warm, dry and intact.  Psychiatric: She has a normal mood and affect. Her speech is normal and behavior is normal. Judgment and thought content normal. Cognition and memory are normal.  Nursing note and vitals reviewed.   Assessment   1. Sinusitis  Plan   1. rec ns, flonase, zpack, prn Tylenol/advil f/u in 1 week if not better  Sudafed no more than 3 days  Can try steams with essential eucalytus oil as well   Provider: Dr. French Ana McLean-Scocuzza-Internal Medicine

## 2017-10-12 NOTE — Progress Notes (Signed)
Pre visit review using our clinic review tool, if applicable. No additional management support is needed unless otherwise documented below in the visit note. 

## 2017-10-16 ENCOUNTER — Encounter: Payer: Self-pay | Admitting: Internal Medicine

## 2017-10-16 ENCOUNTER — Other Ambulatory Visit: Payer: Self-pay | Admitting: Internal Medicine

## 2017-10-16 DIAGNOSIS — J019 Acute sinusitis, unspecified: Secondary | ICD-10-CM

## 2017-10-16 MED ORDER — AMOXICILLIN-POT CLAVULANATE 875-125 MG PO TABS: 1.0000 | ORAL_TABLET | Freq: Two times a day (BID) | ORAL | 0 refills | Status: DC

## 2017-10-29 ENCOUNTER — Ambulatory Visit
Admission: RE | Admit: 2017-10-29 | Discharge: 2017-10-29 | Disposition: A | Discharge status: Home / Self Care | Payer: BC Managed Care – PPO | Source: Ambulatory Visit | Attending: Internal Medicine | Admitting: Internal Medicine

## 2017-10-29 DIAGNOSIS — Z1231 Encounter for screening mammogram for malignant neoplasm of breast: Secondary | ICD-10-CM | POA: Insufficient documentation

## 2017-12-30 ENCOUNTER — Ambulatory Visit: Payer: BC Managed Care – PPO | Admitting: Internal Medicine

## 2017-12-30 ENCOUNTER — Encounter: Payer: Self-pay | Admitting: Internal Medicine

## 2017-12-30 DIAGNOSIS — E663 Overweight: Secondary | ICD-10-CM

## 2017-12-30 DIAGNOSIS — J329 Chronic sinusitis, unspecified: Secondary | ICD-10-CM | POA: Diagnosis not present

## 2017-12-30 DIAGNOSIS — M79674 Pain in right toe(s): Secondary | ICD-10-CM

## 2017-12-30 DIAGNOSIS — M659 Synovitis and tenosynovitis, unspecified: Secondary | ICD-10-CM | POA: Diagnosis not present

## 2017-12-30 DIAGNOSIS — F411 Generalized anxiety disorder: Secondary | ICD-10-CM | POA: Diagnosis not present

## 2017-12-30 NOTE — Progress Notes (Signed)
Pre visit review using our clinic review tool, if applicable. No additional management support is needed unless otherwise documented below in the visit note. 

## 2017-12-30 NOTE — Progress Notes (Signed)
Subjective:  Patient ID: Cynthia Ruiz, female    DOB: August 06, 1965  Age: 52 y.o. MRN: [id number]  CC: Diagnoses of Overweight, Flexor tenosynovitis of thumb, GAD (generalized anxiety disorder), Recurrent sinusitis, and Toe pain, right were pertinent to this visit.  HPI Cynthia Ruiz presents for follow up on multiple issues. Last seen by me  In May 2018  Last CPE with [facility] Nov 2018 PAP NORMAL  Treated for sinusitis in July by TMS.  2nd round of abx required.  Symptoms finally resolved,  She has a history of recurrent sinusitis    5th toe injury  On right foot   No fracture reported but still red and swollen .  Feels better   Thumb healing well (left) after seeing Ortho Ruiz in June 2018 and undergoing trigger release    Recurrent sinus issues and PND,    Has adverse reaction to benadryl     Outpatient Medications Prior to Visit  Medication Sig Dispense Refill  . cetirizine (ZYRTEC) 5 MG tablet Take 10 mg by mouth daily.    Marland Kitchen amoxicillin-clavulanate (AUGMENTIN) 875-125 MG tablet Take 1 tablet by mouth 2 (two) times daily. With food 14 tablet 0  . azithromycin (ZITHROMAX) 250 MG tablet 2 pills day 1 and 1 pill day 2-5 6 tablet 0  . ibuprofen (ADVIL,MOTRIN) 200 MG tablet Take 200 mg by mouth every 6 (six) hours as needed.    . Pseudoephedrine HCl (SUDAFED PO) Take by mouth.     No facility-administered medications prior to visit.     Review of Systems;  Patient denies headache, fevers, malaise, unintentional weight loss, skin rash, eye pain, sinus congestion and sinus pain, sore throat, dysphagia,  hemoptysis , cough, dyspnea, wheezing, chest pain, palpitations, orthopnea, edema, abdominal pain, nausea, melena, diarrhea, constipation, flank pain, dysuria, hematuria, urinary  Frequency, nocturia, numbness, tingling, seizures,  Focal weakness, Loss of consciousness,  Tremor, insomnia, depression, anxiety, and suicidal ideation.      Objective:  BP 102/60   Pulse 64    Temp (!) 97.5 F (36.4 C) (Oral)   Ht 5\' 4"  (1.626 m)   Wt 165 lb 12.8 oz (75.2 kg)   SpO2 96%   BMI 28.46 kg/m   BP Readings from Last 3 Encounters:  12/30/17 102/60  10/12/17 116/66  03/12/17 138/72    Wt Readings from Last 3 Encounters:  12/30/17 165 lb 12.8 oz (75.2 kg)  10/12/17 170 lb (77.1 kg)  03/12/17 162 lb 1.6 oz (73.5 kg)    General appearance: alert, cooperative and appears stated age Ears: normal TM's and external ear canals both ears Throat: lips, mucosa, and tongue normal; teeth and gums normal Neck: no adenopathy, no carotid bruit, supple, symmetrical, trachea midline and thyroid not enlarged, symmetric, no tenderness/mass/nodules Back: symmetric, no curvature. ROM normal. No CVA tenderness. Lungs: clear to auscultation bilaterally Heart: regular rate and rhythm, S1, S2 normal, no murmur, click, rub or gallop Abdomen: soft, non-tender; bowel sounds normal; no masses,  no organomegaly Pulses: 2+ and symmetric Skin: Skin color, texture, turgor normal. No rashes or lesions Lymph nodes: Cervical, supraclavicular, and axillary nodes normal.  Lab Results  Component Value Date   HGBA1C 5.6 03/12/2017   HGBA1C 5.4 02/11/2016    Lab Results  Component Value Date   CREATININE 0.85 03/12/2017   CREATININE 0.84 02/11/2016   CREATININE 0.78 02/20/2015    Lab Results  Component Value Date   WBC 6.6 02/20/2015   HGB 13.3 02/20/2015  HCT 39.8 02/20/2015   PLT 330 02/20/2015   GLUCOSE 94 03/12/2017   CHOL 128 03/12/2017   TRIG 77 03/12/2017   HDL 60 03/12/2017   LDLCALC 53 03/12/2017   ALT 18 03/12/2017   AST 25 03/12/2017   NA 140 03/12/2017   K 4.4 03/12/2017   CL 101 03/12/2017   CREATININE 0.85 03/12/2017   BUN 16 03/12/2017   CO2 25 03/12/2017   TSH 3.640 03/12/2017   HGBA1C 5.6 03/12/2017   MICROALBUR <0.7 02/11/2016    Mm 3d Screen Breast Bilateral  Result Date: 10/29/2017 CLINICAL DATA:  Screening. EXAM: DIGITAL SCREENING BILATERAL  MAMMOGRAM WITH TOMO AND CAD COMPARISON:  Previous exam(s). ACR Breast Density Category b: There are scattered areas of fibroglandular density. FINDINGS: There are no findings suspicious for malignancy. Images were processed with CAD. IMPRESSION: No mammographic evidence of malignancy. A result letter of this screening mammogram will be mailed directly to the patient. RECOMMENDATION: Screening mammogram in one year. (Code:SM-B-01Y) BI-RADS CATEGORY  1: Negative. Electronically Signed   By: Cynthia HammanMichelle  Ruiz M.D.   On: 10/29/2017 09:01    Assessment & Plan:   Problem List Items Addressed This Visit    Flexor tenosynovitis of thumb    S/p trigger release by Cynthia KeaMenz June 2018      GAD (generalized anxiety disorder)    Secondary to financial pressures, which has been somewhat relieved with job change and husbands employment. Improved       Overweight    I have addressed  BMI and recommended wt loss of 10% of body weigh over the next 6 months using a low glycemic index diet and regular exercise a minimum of 5 days per week.        Recurrent sinusitis    Currently resolved  Recommend saline flushes daily      Toe pain, right    Still red and swollen despite  negative x rays a at [facility].  Continue buddy taping and use of hard sole shoes        A total of 40 minutes was spent with patient more than half of which was spent in counseling patient on the above mentioned issues , reviewing and explaining recent labs and imaging studies done, and coordination of care.   I have discontinued Cynthia Auerbacheborah M. Ruiz "Cynthia Ruiz"'s Pseudoephedrine HCl (SUDAFED PO), ibuprofen, azithromycin, and amoxicillin-clavulanate. I am also having her maintain her cetirizine.  No orders of the defined types were placed in this encounter.   Medications Discontinued During This Encounter  Medication Reason  . amoxicillin-clavulanate (AUGMENTIN) 875-125 MG tablet Patient has not taken in last 30 days  . azithromycin  (ZITHROMAX) 250 MG tablet Patient has not taken in last 30 days  . ibuprofen (ADVIL,MOTRIN) 200 MG tablet Patient has not taken in last 30 days  . Pseudoephedrine HCl (SUDAFED PO) Patient has not taken in last 30 days    Follow-up: Return in about 1 year (around 12/31/2018).   Sherlene Shamseresa L Alfred Harrel, MD

## 2017-12-30 NOTE — Patient Instructions (Addendum)
During flu season:  You should consider using a Netti Pot or  [employer]'s Sinus rinse ; as a daily  Irrigant  To "flush" your sinuses using water and medicated salts.  Do it over the sink because it can be a bit messy   For post nasal drip   You can add azelastine  To the zyrtec to see if it helps   Try the SOLA low carb bread ([employer]  Frozen bread )  3 g/slice  Tastes great!    Spaghetti squash!!   Great tasting pasta substitute    E mail me If you want a trial of trazodone for early morning waking    Almond /cocunut or soy milk are  healthier  Choices than cow's milk  bc they do not contain cholesterol   Your measles titer is fine!

## 2018-01-02 DIAGNOSIS — J329 Chronic sinusitis, unspecified: Secondary | ICD-10-CM | POA: Insufficient documentation

## 2018-01-02 DIAGNOSIS — M79674 Pain in right toe(s): Secondary | ICD-10-CM | POA: Insufficient documentation

## 2018-01-02 NOTE — Assessment & Plan Note (Addendum)
Still red and swollen despite  negative x rays a at [facility].  Continue buddy taping and use of hard sole shoes

## 2018-01-02 NOTE — Assessment & Plan Note (Signed)
I have addressed  BMI and recommended wt loss of 10% of body weigh over the next 6 months using a low glycemic index diet and regular exercise a minimum of 5 days per week.   

## 2018-01-02 NOTE — Assessment & Plan Note (Signed)
S/p trigger release by [facility] June 2018

## 2018-01-02 NOTE — Assessment & Plan Note (Signed)
Currently resolved  Recommend saline flushes daily

## 2018-01-02 NOTE — Assessment & Plan Note (Signed)
Secondary to financial pressures, which has been somewhat relieved with job change and husbands employment. Improved

## 2018-03-17 ENCOUNTER — Encounter: Payer: Self-pay | Admitting: *Deleted

## 2018-03-18 ENCOUNTER — Encounter: Payer: BLUE CROSS/BLUE SHIELD | Admitting: Obstetrics and Gynecology

## 2018-03-22 ENCOUNTER — Encounter: Payer: Self-pay | Admitting: Obstetrics and Gynecology

## 2018-03-29 ENCOUNTER — Encounter: Payer: Self-pay | Admitting: Obstetrics and Gynecology

## 2018-03-29 ENCOUNTER — Ambulatory Visit (INDEPENDENT_AMBULATORY_CARE_PROVIDER_SITE_OTHER): Payer: Managed Care, Other (non HMO) | Admitting: Obstetrics and Gynecology

## 2018-03-29 VITALS — BP 123/78 | HR 83 | Ht 64.0 in | Wt 153.0 lb

## 2018-03-29 DIAGNOSIS — Z01419 Encounter for gynecological examination (general) (routine) without abnormal findings: Secondary | ICD-10-CM | POA: Diagnosis not present

## 2018-03-29 DIAGNOSIS — R1084 Generalized abdominal pain: Secondary | ICD-10-CM

## 2018-03-29 NOTE — Progress Notes (Signed)
Subjective:   Cynthia AuerbachDeborah M Ruiz is a 52 y.o. G3P3 Caucasian female here for a routine well-woman exam.  No LMP recorded. (Menstrual status: Perimenopausal).    Current complaints: reports increased abdominal pain and bloating with frequent BMs when under a lot of stress. Desires referral to GI. Worse after eating and decreases appetite and thirst. Doesn't feel like she can eat anything.  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 Tobacco/alcohol: no tobacco use Illicit drugs: no history of illicit drug use  The following portions of the patient's history were reviewed and updated as appropriate: allergies, current medications, past family history, past medical history, past social history, past surgical history and problem list.  Past Medical History Past Medical History:  Diagnosis Date  . Anxiety     Past Surgical History Past Surgical History:  Procedure Laterality Date  . CESAREAN SECTION  1991, 2002, 2007  . COLONOSCOPY WITH PROPOFOL N/A 07/18/2015   Procedure: COLONOSCOPY WITH PROPOFOL;  Surgeon: Earline MayotteJeffrey W Byrnett, MD;  [facility];  Service: Endoscopy;  Laterality: N/A;  . KNEE ARTHROSCOPY Right 1991  . Pyloric Stenosis surgery    . TUBAL LIGATION  2007    Gynecologic History G3P3  No LMP recorded. (Menstrual status: Perimenopausal). Contraception: post menopausal status Last Pap: 2018. Results were: normal Last mammogram: 10/2017. Results were: normal   Obstetric History OB History  Gravida Para Term Preterm AB Living  3 3          SAB TAB Ectopic Multiple Live Births               # Outcome Date GA Lbr Len/2nd Weight Sex Delivery Anes PTL Lv  3 Para           2 Para           1 Para             Obstetric Comments  1st Menstrual Cycle:  10   1st Pregnancy:  24    Current Medications Current Outpatient  Medications on File Prior to Visit  Medication Sig Dispense Refill  . loratadine (CLARITIN) 10 MG tablet Take 10 mg by mouth daily.    . cetirizine (ZYRTEC) 5 MG tablet Take 10 mg by mouth daily.     No current facility-administered medications on file prior to visit.     Review of Systems Patient denies any headaches, blurred vision, shortness of breath, chest pain, abdominal pain, problems with bowel movements, urination, or intercourse.  Objective:  BP 123/78   Pulse 83   Ht 5\' 4"  (1.626 m)   Wt 153 lb (69.4 kg)   BMI 26.26 kg/m  Physical Exam  General:  Well developed, well nourished, no acute distress. She is alert and oriented x3. Skin:  Warm and dry Neck:  Midline trachea, no thyromegaly or nodules Cardiovascular: Regular rate and rhythm, no murmur heard Lungs:  Effort normal, all lung fields clear to auscultation bilaterally Breasts:  No dominant palpable mass, retraction, or nipple discharge Abdomen:  Soft, non tender, no hepatosplenomegaly or masses Pelvic:  External genitalia is normal in appearance.  The vagina is normal in appearance. The cervix is bulbous, no CMT.  Thin prep pap is not done. Uterus is felt to be normal size, shape, and contour.  No adnexal masses or tenderness noted. Extremities:  No swelling or varicosities  noted Psych:  She has a normal mood and affect  Assessment:   Healthy well-woman exam Postprandial abdominal pain and BM   Plan:  Labs obtained- will follow up accordingly Referred to GI Recommend daily probiotics F/U 1 year for AE, or sooner if needed   Suzan Nailer, CNM

## 2018-03-30 LAB — LIPID PANEL
CHOL/HDL RATIO: 2.1 ratio (ref 0.0–4.4)
Cholesterol, Total: 124 mg/dL (ref 100–199)
HDL: 58 mg/dL (ref >39)
LDL CALC: 47 mg/dL (ref 0–99)
Triglycerides: 96 mg/dL (ref 0–149)
VLDL CHOLESTEROL CAL: 19 mg/dL (ref 5–40)

## 2018-03-30 LAB — COMPREHENSIVE METABOLIC PANEL
ALBUMIN: 4.6 g/dL (ref 3.5–5.5)
ALK PHOS: 80 IU/L (ref 39–117)
ALT: 15 IU/L (ref 0–32)
AST: 20 IU/L (ref 0–40)
Albumin/Globulin Ratio: 1.7 (ref 1.2–2.2)
BILIRUBIN TOTAL: 0.2 mg/dL (ref 0.0–1.2)
BUN/Creatinine Ratio: 14 (ref 9–23)
BUN: 14 mg/dL (ref 6–24)
CHLORIDE: 104 mmol/L (ref 96–106)
CO2: 22 mmol/L (ref 20–29)
CREATININE: 0.99 mg/dL (ref 0.57–1.00)
Calcium: 9.3 mg/dL (ref 8.7–10.2)
GFR calc Af Amer: 75 mL/min/{1.73_m2} (ref >59)
GFR calc non Af Amer: 65 mL/min/{1.73_m2} (ref >59)
GLUCOSE: 91 mg/dL (ref 65–99)
Globulin, Total: 2.7 g/dL (ref 1.5–4.5)
Potassium: 4.3 mmol/L (ref 3.5–5.2)
Sodium: 142 mmol/L (ref 134–144)
Total Protein: 7.3 g/dL (ref 6.0–8.5)

## 2018-03-30 LAB — B12 AND FOLATE PANEL
FOLATE: 14.8 ng/mL (ref >3.0)
VITAMIN B 12: 614 pg/mL (ref 232–1245)

## 2018-03-30 LAB — HEMOGLOBIN A1C
ESTIMATED AVERAGE GLUCOSE: 111 mg/dL
HEMOGLOBIN A1C: 5.5 % (ref 4.8–5.6)

## 2018-03-30 LAB — VITAMIN D 25 HYDROXY (VIT D DEFICIENCY, FRACTURES): VIT D 25 HYDROXY: 44.1 ng/mL (ref 30.0–100.0)

## 2018-04-09 ENCOUNTER — Other Ambulatory Visit: Payer: Self-pay | Admitting: *Deleted

## 2018-04-09 DIAGNOSIS — R1084 Generalized abdominal pain: Secondary | ICD-10-CM

## 2018-04-20 ENCOUNTER — Encounter: Payer: Self-pay | Admitting: Gastroenterology

## 2018-04-20 ENCOUNTER — Ambulatory Visit (INDEPENDENT_AMBULATORY_CARE_PROVIDER_SITE_OTHER): Payer: BLUE CROSS/BLUE SHIELD | Admitting: Gastroenterology

## 2018-04-20 VITALS — BP 124/74 | HR 83 | Ht 64.0 in | Wt 152.6 lb

## 2018-04-20 DIAGNOSIS — R1013 Epigastric pain: Secondary | ICD-10-CM

## 2018-04-20 NOTE — Progress Notes (Signed)
Cynthia Ruiz [address]  [address]  [place], [place] [address]  Main: [phone]  Fax: [phone]   Gastroenterology Consultation  Referring Provider:     Purcell NailsShambley, Melody N, CNM Primary Care Physician:  Cynthia Ruiz, Cynthia Ruiz, Cynthia Ruiz Primary Gastroenterologist:  Dr. Melodie BouillonVarnita Ruiz Reason for Consultation:    Abdominal pain        HPI:    Chief Complaint  Patient presents with  . New Patient (Initial Visit)    Referral from M.Burr, CNM for general postprandial abdominal pain.    Cynthia AuerbachDeborah M Ruiz is a 53 y.o. y/o female referred for consultation & management  by Dr. Sherlene Ruiz, Cynthia Ruiz, Cynthia Ruiz.  Patient reports intermittent history of abdominal cramping and pain, dull, diffuse, 3/10, nonradiating occurring only on the days where she has multiple bowel movements in the same day.  Last time this occurred was about 3 weeks ago.  Her main symptoms during these days is multiple bowel movements after eating or drinking something, but she specifically states that these bowel movements are not watery or loose and are formed.  She states these are not hard.  She denies any melena or hematochezia.  When I specifically asked if she is noted if these episodes occur after days of constipation, she does state that sometimes she does not have a bowel movement or when she tries to evacuate she just has gas leading up to these days.  For example, today she went to the bathroom to have a bowel movement but just had flatulence and did not have a bowel movement.  States that these episodes have occurred more frequently, about once a week since August or September 2019 when she started a new diet to be healthy and cut out bread.  Has also tried keto diet in the past which really affected her bowels and has stopped doing that.  Prior to August or September 2019 these episodes would occur occasionally about once every month or every other month.  Denies any unintentional weight loss.  States she is exercising  more and has thus had expected and healthy weight loss due to this.  Is also trying to eat healthy.  Had a screening colonoscopy in 2017 with Dr. Lemar LivingsByrnett, which was reported to be a normal exam, excellent prep, extent of exam cecum, repeat recommended in 10 years for screening.  Patient states she had the above symptoms prior to that exam as well, and at that time they were occurring once a month or once every other month as stated above.  No family history of colon cancer.  Denies any heartburn.  Denies any dysphagia.  Past Medical History:  Diagnosis Date  . Anxiety     Past Surgical History:  Procedure Laterality Date  . CESAREAN SECTION  1991, 2002, 2007  . COLONOSCOPY WITH PROPOFOL N/A 07/18/2015   Procedure: COLONOSCOPY WITH PROPOFOL;  Surgeon: Earline MayotteJeffrey W Byrnett, Cynthia Ruiz;  [facility];  Service: Endoscopy;  Laterality: N/A;  . KNEE ARTHROSCOPY Right 1991  . Pyloric Stenosis surgery    . TUBAL LIGATION  2007    Prior to Admission medications   Medication Sig Start Date End Date Taking? Authorizing Provider  loratadine (CLARITIN) 10 MG tablet Take 10 mg by mouth daily.   Yes Provider, Historical, Cynthia Ruiz  cetirizine (ZYRTEC) 5 MG tablet Take 10 mg by mouth daily.    Provider, Historical, Cynthia Ruiz    Family History  Problem Relation Age of Onset  . Arthritis Mother [id number]  rheumatoid arthritis - died 22  . Hypertension Father   . Glaucoma Father   . Cancer Father 51       multiple colon polyps,  not CA   . Multiple sclerosis Sister 34  . Diabetes Paternal Grandfather   . Down syndrome Daughter   . Cancer Maternal Grandmother        bile duct  . Cancer Paternal Grandmother        unsure what type  . Breast cancer Neg Hx      Social History   Tobacco Use  . Smoking status: Never Smoker  . Smokeless tobacco: Never Used  Substance Use Topics  . Alcohol use: Yes    Comment: Occasionally   . Drug use: No    Allergies as of 04/20/2018  . (No Known Allergies)     Review of Systems:    All systems reviewed and negative except where noted in HPI.   Physical Exam:  BP 124/74   Pulse 83   Ht 5\' 4"  (1.626 m)   Wt 152 lb 9.6 oz (69.2 kg)   BMI 26.19 kg/m  No LMP recorded. (Menstrual status: Perimenopausal). Psych:  Alert and cooperative. Normal mood and affect. General:   Alert,  Well-developed, well-nourished, pleasant and cooperative in NAD Head:  Normocephalic and atraumatic. Eyes:  Sclera clear, no icterus.   Conjunctiva pink. Ears:  Normal auditory acuity. Nose:  No deformity, discharge, or lesions. Mouth:  No deformity or lesions,oropharynx pink & moist. Neck:  Supple; no masses or thyromegaly. Abdomen:  Normal bowel sounds.  No bruits.  Soft, non-tender and non-distended without masses, hepatosplenomegaly or hernias noted.  No guarding or rebound tenderness.    Msk:  Symmetrical without gross deformities. Good, equal movement & strength bilaterally. Pulses:  Normal pulses noted. Extremities:  No clubbing or edema.  No cyanosis. Neurologic:  Alert and oriented x3;  grossly normal neurologically. Skin:  Intact without significant lesions or rashes. No jaundice. Lymph Nodes:  No significant cervical adenopathy. Psych:  Alert and cooperative. Normal mood and affect.   Labs: CBC    Component Value Date/Time   WBC 6.6 02/20/2015 0946   WBC 7.2 02/18/2013 0845   RBC 4.43 02/20/2015 0946   RBC 4.25 02/18/2013 0845   HGB 13.3 02/20/2015 0946   HCT 39.8 02/20/2015 0946   PLT 330 02/20/2015 0946   MCV 90 02/20/2015 0946   MCH 30.0 02/20/2015 0946   MCHC 33.4 02/20/2015 0946   MCHC 33.1 02/18/2013 0845   RDW 13.5 02/20/2015 0946   LYMPHSABS 1.4 02/18/2013 0845   MONOABS 0.6 02/18/2013 0845   EOSABS 0.0 02/18/2013 0845   BASOSABS 0.0 02/18/2013 0845   CMP     Component Value Date/Time   NA 142 03/29/2018 1627   K 4.3 03/29/2018 1627   CL 104 03/29/2018 1627   CO2 22 03/29/2018 1627   GLUCOSE 91 03/29/2018 1627   GLUCOSE 84  02/11/2016 1006   BUN 14 03/29/2018 1627   CREATININE 0.99 03/29/2018 1627   CALCIUM 9.3 03/29/2018 1627   PROT 7.3 03/29/2018 1627   ALBUMIN 4.6 03/29/2018 1627   AST 20 03/29/2018 1627   ALT 15 03/29/2018 1627   ALKPHOS 80 03/29/2018 1627   BILITOT 0.2 03/29/2018 1627   GFRNONAA 65 03/29/2018 1627   GFRAA 75 03/29/2018 1627    Imaging Studies: No results found.  Assessment and Plan:   Cynthia Ruiz is a 53 y.o. y/o female has been referred for  intermittent abdominal pain occurring only on the days where she has frequent bowel movements  Her symptoms do not occur postprandially They do not occur on a daily basis The only occurre on the days where she has frequent runs to the bathroom, without diarrhea or loose bowel movements, and occur after she has had a few days of constipation.  Her symptoms are likely related to constipation where she is having hard bowel movements or not going properly for a few days followed by need for multiple evacuations the next day. She does not have any alarm symptoms She has had a recent colonoscopy therefore risk of malignancy is very low There are no clinical symptoms to suggest IBD No indication for repeat colonoscopy at this time  I have suggested starting MiraLAX and titrating it to where she has 1-2 soft bowel movements daily.  She is willing to try this.  If symptoms not improve I have asked her to contact us and she verbalized understanding.  Due to her intermittent abdominal pain during these days which is likely due to constipation, but can also be dyspepsia, I will obtain H. pylori breath test to rule out underlying H. pylori and patient is agreeable.  No alarm symptoms to indicate EGD at this time either  Again I have discussed with her that if symptoms not resolve we can consider endoscopy at that time and she is agreeable to this plan and will call us if symptoms do not improve  Labs reviewed and are reassuring  Follow-up in  clinic in 6 months or earlier if needed   Dr Cynthia Bouillon  Speech recognition software was used to dictate the above note.

## 2018-04-20 NOTE — Patient Instructions (Signed)

## 2018-04-26 DIAGNOSIS — R1013 Epigastric pain: Secondary | ICD-10-CM | POA: Diagnosis not present

## 2018-04-27 LAB — H. PYLORI BREATH TEST: H pylori Breath Test: NEGATIVE

## 2018-04-29 ENCOUNTER — Telehealth: Payer: Self-pay

## 2018-04-29 NOTE — Telephone Encounter (Signed)
Pt informed that H. Pylori test negative and will wait to see if symptons improve with Miralax and contact office to schedule EGD if needed.

## 2018-04-29 NOTE — Telephone Encounter (Signed)
LMTCO. Test negative.

## 2018-04-29 NOTE — Telephone Encounter (Signed)
-----   Message from Pasty Spillers, MD sent at 04/28/2018  2:26 PM EST ----- Cleve Paolillo please let patient know, her H. pylori breath test was negative.  If she would like to proceed with upper endoscopy, we can schedule.  If she would like to wait to see if symptoms improve with MiraLAX that is fine and she can call us if symptoms do not improve.

## 2018-10-19 ENCOUNTER — Other Ambulatory Visit: Payer: Self-pay | Admitting: Obstetrics and Gynecology

## 2018-10-19 ENCOUNTER — Other Ambulatory Visit: Payer: Self-pay | Admitting: Internal Medicine

## 2018-10-19 DIAGNOSIS — Z1231 Encounter for screening mammogram for malignant neoplasm of breast: Secondary | ICD-10-CM

## 2018-11-30 ENCOUNTER — Ambulatory Visit
Admission: RE | Admit: 2018-11-30 | Discharge: 2018-11-30 | Disposition: A | Discharge status: Home / Self Care | Payer: BC Managed Care – PPO | Source: Ambulatory Visit | Attending: Obstetrics and Gynecology | Admitting: Obstetrics and Gynecology

## 2018-11-30 DIAGNOSIS — Z1231 Encounter for screening mammogram for malignant neoplasm of breast: Secondary | ICD-10-CM | POA: Insufficient documentation

## 2019-01-07 ENCOUNTER — Ambulatory Visit: Payer: BC Managed Care – PPO | Admitting: Internal Medicine

## 2019-01-25 ENCOUNTER — Other Ambulatory Visit: Payer: Self-pay

## 2019-01-27 ENCOUNTER — Ambulatory Visit (INDEPENDENT_AMBULATORY_CARE_PROVIDER_SITE_OTHER): Payer: BC Managed Care – PPO | Admitting: Internal Medicine

## 2019-01-27 ENCOUNTER — Other Ambulatory Visit: Payer: Self-pay

## 2019-01-27 ENCOUNTER — Encounter: Payer: Self-pay | Admitting: Internal Medicine

## 2019-01-27 VITALS — Ht 64.0 in | Wt 152.0 lb

## 2019-01-27 DIAGNOSIS — E663 Overweight: Secondary | ICD-10-CM

## 2019-01-27 DIAGNOSIS — Z20822 Contact with and (suspected) exposure to covid-19: Secondary | ICD-10-CM

## 2019-01-27 DIAGNOSIS — Z20828 Contact with and (suspected) exposure to other viral communicable diseases: Secondary | ICD-10-CM | POA: Diagnosis not present

## 2019-01-27 DIAGNOSIS — K5901 Slow transit constipation: Secondary | ICD-10-CM

## 2019-01-27 DIAGNOSIS — Z1152 Encounter for screening for COVID-19: Secondary | ICD-10-CM

## 2019-01-27 NOTE — Progress Notes (Signed)
Virtual Visit via [email or web address]  This visit type was conducted due to national recommendations for restrictions regarding the COVID-19 pandemic (e.g. social distancing).  This format is felt to be most appropriate for this patient at this time.  All issues noted in this document were discussed and addressed.  No physical exam was performed (except for noted visual exam findings with Video Visits).   I connected with@ on 01/27/19 at 10:00 AM EDT by a video enabled telemedicine application  and verified that I am speaking with the correct person using two identifiers. Location patient: home Location provider: work or home office Persons participating in the virtual visit: patient, provider  I discussed the limitations, risks, security and privacy concerns of performing an evaluation and management service by telephone and the availability of in person appointments. I also discussed with the patient that there may be a patient responsible charge related to this service. The patient expressed understanding and agreed to proceed.  Reason for visit: FOLLOW UP  HPI:  1) HAD A FALL WHILE STANDING IN A PARK NEAR A WATERFALL.  FELL ON SLIPPERY ROCKS,  DIRECTLY ONTO TAILBONE. STILL VERY TENDER WITH CERTAIN MOVEMENTS   OCCURRED IN [facility] . CURRENTLY PAINFUL WHEN GETTING INTO BED ONLY.  USES A DOUGHNUT PILLOW FOR A WHILE   2) RECENT COVID EXPOSURE TO ASYMPTOMATIC TEENAGER WHO VISITED HER DAUGHTER ON OCT 9 .  GETTING TESTED TODAY   3) MAMMOGRAM NORMAL November 30 2018  4) oVERWIEGHT: LOSING WEIGHT INTENTIONALLY now at 145 LBS CURRENTly,   WITH PORTION REDUCTION AND WALKING A MILE DAILY .  Managing stress with humor and exercise.   5)IBS saw tahiliani for abdominal pain.  diagnosed with IBS  by Tahiliani  who recommended miralax . She can't tolerate miralax I water ,  ususing it in a smoothie and tolerating supplement. Marland Kitchen  still having gas but tolerating it better   ROS: See pertinent positives and negatives per  HPI.  Past Medical History:  Diagnosis Date  . Anxiety     Past Surgical History:  Procedure Laterality Date  . CESAREAN SECTION  1991, 2002, 2007  . COLONOSCOPY WITH PROPOFOL N/A 07/18/2015   Procedure: COLONOSCOPY WITH PROPOFOL;  Surgeon: Earline Mayotte, MD;  Location: [facility] ENDOSCOPY;  Service: Endoscopy;  Laterality: N/A;  . KNEE ARTHROSCOPY Right 1991  . Pyloric Stenosis surgery    . TUBAL LIGATION  2007    Family History  Problem Relation Age of Onset  . Arthritis Mother 16       rheumatoid arthritis - died 73  . Hypertension Father   . Glaucoma Father   . Cancer Father 75       multiple colon polyps,  not CA   . Multiple sclerosis Sister 67  . Diabetes Paternal Grandfather   . Down syndrome Daughter   . Cancer Maternal Grandmother        bile duct  . Cancer Paternal Grandmother        unsure what type  . Breast cancer Neg Hx     SOCIAL HX:  reports that she has never smoked. She has never used smokeless tobacco. She reports current alcohol use. She reports that she does not use drugs.   Current Outpatient Medications:  .  cetirizine (ZYRTEC) 5 MG tablet, Take 10 mg by mouth daily., Disp: , Rfl:   EXAM:  VITALS per patient if applicable:  GENERAL: alert, oriented, appears well and in no acute distress  HEENT: atraumatic, conjunttiva clear, no  obvious abnormalities on inspection of external nose and ears  NECK: normal movements of the head and neck  LUNGS: on inspection no signs of respiratory distress, breathing rate appears normal, no obvious gross SOB, gasping or wheezing  CV: no obvious cyanosis  MS: moves all visible extremities without noticeable abnormality  PSYCH/NEURO: pleasant and cooperative, no obvious depression or anxiety, speech and thought processing grossly intact  ASSESSMENT AND PLAN:  Discussed the following assessment and plan:  Encounter for screening laboratory testing for COVID-19 virus - Plan: CANCELED: Novel Coronavirus, NAA  ([facility])  Overweight  Slow transit constipation  Encounter for screening laboratory testing for COVID-19 virus Patient was exposed to an asymptomatic covid positive patient several days ago and was tested today.  Test was negative ; however she was advised to isolate  For 14 days from date of exposure to avoid spreading   Overweight I have congratulated her in reduction of   BMI and encouraged  maintenance of  weight loss with  using a low glycemic index diet and regular exercise a minimum of 5 days per week.    Constipation Managed with daily use of miralax    I discussed the assessment and treatment plan with the patient. The patient was provided an opportunity to ask questions and all were answered. The patient agreed with the plan and demonstrated an understanding of the instructions.   The patient was advised to call back or seek an in-person evaluation if the symptoms worsen or if the condition fails to improve as anticipated.  I provided  25 minutes of non-face-to-face time during this encounter reviewing patient's current problems and recent GI evaluation, providing counseling on the above mentioned problems , and coordination  of care .  Crecencio Mc, MD

## 2019-01-29 DIAGNOSIS — Z20822 Contact with and (suspected) exposure to covid-19: Secondary | ICD-10-CM | POA: Insufficient documentation

## 2019-01-29 DIAGNOSIS — Z1152 Encounter for screening for COVID-19: Secondary | ICD-10-CM | POA: Insufficient documentation

## 2019-01-29 DIAGNOSIS — Z20828 Contact with and (suspected) exposure to other viral communicable diseases: Secondary | ICD-10-CM | POA: Insufficient documentation

## 2019-01-29 DIAGNOSIS — K5900 Constipation, unspecified: Secondary | ICD-10-CM | POA: Insufficient documentation

## 2019-01-29 LAB — NOVEL CORONAVIRUS, NAA: SARS-CoV-2, NAA: NOT DETECTED

## 2019-01-29 NOTE — Assessment & Plan Note (Signed)
Managed with daily use of miralax

## 2019-01-29 NOTE — Assessment & Plan Note (Signed)
Patient was exposed to an asymptomatic covid positive patient several days ago and was tested today.  Test was negative ; however she was advised to isolate  For 14 days from date of exposure to avoid spreading

## 2019-01-29 NOTE — Assessment & Plan Note (Signed)
I have congratulated her in reduction of   BMI and encouraged  maintenance of  weight loss with  using a low glycemic index diet and regular exercise a minimum of 5 days per week.

## 2019-02-21 NOTE — Telephone Encounter (Signed)
Patient says earis feeling better since using the Ofloxacin ear drops , but still has full feeling scheduled virtual for 02/23/19

## 2019-02-23 ENCOUNTER — Encounter: Payer: Self-pay | Admitting: Internal Medicine

## 2019-02-23 ENCOUNTER — Ambulatory Visit (INDEPENDENT_AMBULATORY_CARE_PROVIDER_SITE_OTHER): Payer: BC Managed Care – PPO | Admitting: Internal Medicine

## 2019-02-23 ENCOUNTER — Other Ambulatory Visit: Payer: Self-pay

## 2019-02-23 DIAGNOSIS — H66005 Acute suppurative otitis media without spontaneous rupture of ear drum, recurrent, left ear: Secondary | ICD-10-CM

## 2019-02-23 DIAGNOSIS — G8929 Other chronic pain: Secondary | ICD-10-CM | POA: Diagnosis not present

## 2019-02-23 DIAGNOSIS — H60502 Unspecified acute noninfective otitis externa, left ear: Secondary | ICD-10-CM | POA: Diagnosis not present

## 2019-02-23 DIAGNOSIS — M533 Sacrococcygeal disorders, not elsewhere classified: Secondary | ICD-10-CM | POA: Diagnosis not present

## 2019-02-23 DIAGNOSIS — H6692 Otitis media, unspecified, left ear: Secondary | ICD-10-CM | POA: Insufficient documentation

## 2019-02-23 MED ORDER — AMOXICILLIN-POT CLAVULANATE 875-125 MG PO TABS: 1.0000 | ORAL_TABLET | Freq: Two times a day (BID) | ORAL | 0 refills | Status: DC

## 2019-02-23 MED ORDER — OFLOXACIN 0.3 % OT SOLN: OTIC | 0 refills | Status: DC

## 2019-02-23 NOTE — Assessment & Plan Note (Signed)
Improving gradually since fall in march.  Pain improves with movement

## 2019-02-23 NOTE — Progress Notes (Signed)
Virtual Visit via [email or web address]   This visit type was conducted due to national recommendations for restrictions regarding the COVID-19 pandemic (e.g. social distancing).  This format is felt to be most appropriate for this patient at this time.  All issues noted in this document were discussed and addressed.  No physical exam was performed (except for noted visual exam findings with Video Visits).   I connected with@ on 02/23/19 at  8:00 AM EST by a video enabled telemedicine application  and verified that I am speaking with the correct person using two identifiers. Location patient: home Location provider: work or home office Persons participating in the virtual visit: patient, provider  I discussed the limitations, risks, security and privacy concerns of performing an evaluation and management service by telephone and the availability of in person appointments. I also discussed with the patient that there may be a patient responsible charge related to this service. The patient expressed understanding and agreed to proceed.   Reason for visit: left ear pain   HPI:  53 yr old female with history of recurrent left sided otitis media/externa s/p tympanostomy tube removal in recent years with iatrogenic  perforation of left TM , presents with left ear pain and drainage which started on Saturday.  No precedent sinus infection which is unusual for her.  Started sing ofloxacin ear drops 5 gtt bid insteadcof prescribed 10 gtt daily on Saturday night.  Has a tender lymph node behind the left ear left TM iatrogenic .  Over the last 24 hours has developed pressure , mild pai in the left frontal sinus    ROS: See pertinent positives and negatives per HPI.  Past Medical History:  Diagnosis Date  . Anxiety     Past Surgical History:  Procedure Laterality Date  . [place], 2002, 2007  . COLONOSCOPY WITH PROPOFOL N/A 07/18/2015   Procedure: COLONOSCOPY WITH PROPOFOL;  Surgeon: Robert Bellow, MD;  Location: [facility] ENDOSCOPY;  Service: Endoscopy;  Laterality: N/A;  . KNEE ARTHROSCOPY Right 1991  . Pyloric Stenosis surgery    . TUBAL LIGATION  2007    Family History  Problem Relation Age of Onset  . Arthritis Mother 45       rheumatoid arthritis - died 64  . Hypertension Father   . Glaucoma Father   . Cancer Father 15       multiple colon polyps,  not CA   . Multiple sclerosis Sister 28  . Diabetes Paternal Grandfather   . Down syndrome Daughter   . Cancer Maternal Grandmother        bile duct  . Cancer Paternal Grandmother        unsure what type  . Breast cancer Neg Hx     SOCIAL HX:  reports that she has never smoked. She has never used smokeless tobacco. She reports current alcohol use. She reports that she does not use drugs.   Current Outpatient Medications:  .  cetirizine (ZYRTEC) 5 MG tablet, Take 10 mg by mouth daily., Disp: , Rfl:  .  ofloxacin (OCUFLOX) 0.3 % ophthalmic solution, , Disp: , Rfl:  .  amoxicillin-clavulanate (AUGMENTIN) 875-125 MG tablet, Take 1 tablet by mouth 2 (two) times daily., Disp: 14 tablet, Rfl: 0 .  ofloxacin (FLOXIN) 0.3 % OTIC solution, 10 gtts daily in affected ear(s) for 7 days., Disp: 4 mL, Rfl: 0  EXAM:  VITALS per patient if applicable:  GENERAL: alert, oriented, appears well and in no acute  distress  HEENT: atraumatic, conjunttiva clear, no obvious abnormalities on inspection of external nose and ears  NECK: normal movements of the head and neck  LUNGS: on inspection no signs of respiratory distress, breathing rate appears normal, no obvious gross SOB, gasping or wheezing  CV: no obvious cyanosis  MS: moves all visible extremities without noticeable abnormality  PSYCH/NEURO: pleasant and cooperative, no obvious depression or anxiety, speech and thought processing grossly intact  ASSESSMENT AND PLAN:  Discussed the following assessment and plan:  Acute otitis externa of left ear, unspecified type - Plan:  ofloxacin (FLOXIN) 0.3 % OTIC solution  Recurrent acute suppurative otitis media without spontaneous rupture of left tympanic membrane  Coccygeal pain, chronic  Otitis media, left Adding augmentin given progression of symptoms .  Patient declines prednisone taper.  ofloxacin gtts refilled as well .  Daily use of a probiotic advised for 3 weeks.   Coccygeal pain, chronic Improving gradually since fall in march.  Pain improves with movement     I discussed the assessment and treatment plan with the patient. The patient was provided an opportunity to ask questions and all were answered. The patient agreed with the plan and demonstrated an understanding of the instructions.   The patient was advised to call back or seek an in-person evaluation if the symptoms worsen or if the condition fails to improve as anticipated.  I provided 15 minutes of non-face-to-face time during this encounter reviewing patient's current problems and post surgeries.  Providing counseling on the above mentioned problems , and coordination  of care .  Sherlene Shams, MD

## 2019-02-23 NOTE — Assessment & Plan Note (Signed)
Adding augmentin given progression of symptoms .  Patient declines prednisone taper.  ofloxacin gtts refilled as well .  Daily use of a probiotic advised for 3 weeks.

## 2019-02-25 ENCOUNTER — Ambulatory Visit (INDEPENDENT_AMBULATORY_CARE_PROVIDER_SITE_OTHER): Payer: BC Managed Care – PPO

## 2019-02-25 ENCOUNTER — Other Ambulatory Visit: Payer: Self-pay

## 2019-02-25 DIAGNOSIS — Z23 Encounter for immunization: Secondary | ICD-10-CM | POA: Diagnosis not present

## 2019-09-20 ENCOUNTER — Other Ambulatory Visit: Payer: Self-pay

## 2019-09-22 ENCOUNTER — Ambulatory Visit (INDEPENDENT_AMBULATORY_CARE_PROVIDER_SITE_OTHER): Payer: BC Managed Care – PPO | Admitting: Internal Medicine

## 2019-09-22 ENCOUNTER — Other Ambulatory Visit: Payer: Self-pay

## 2019-09-22 ENCOUNTER — Ambulatory Visit (INDEPENDENT_AMBULATORY_CARE_PROVIDER_SITE_OTHER): Payer: BC Managed Care – PPO

## 2019-09-22 ENCOUNTER — Encounter: Payer: Self-pay | Admitting: Internal Medicine

## 2019-09-22 VITALS — BP 116/80 | HR 90 | Temp 97.6°F | Resp 15 | Ht 64.0 in | Wt 149.6 lb

## 2019-09-22 DIAGNOSIS — M5412 Radiculopathy, cervical region: Secondary | ICD-10-CM | POA: Diagnosis not present

## 2019-09-22 DIAGNOSIS — R200 Anesthesia of skin: Secondary | ICD-10-CM | POA: Diagnosis not present

## 2019-09-22 DIAGNOSIS — M79661 Pain in right lower leg: Secondary | ICD-10-CM | POA: Diagnosis not present

## 2019-09-22 DIAGNOSIS — M79651 Pain in right thigh: Secondary | ICD-10-CM | POA: Diagnosis not present

## 2019-09-22 DIAGNOSIS — M5387 Other specified dorsopathies, lumbosacral region: Secondary | ICD-10-CM | POA: Diagnosis not present

## 2019-09-22 DIAGNOSIS — M545 Low back pain: Secondary | ICD-10-CM | POA: Diagnosis not present

## 2019-09-22 DIAGNOSIS — R252 Cramp and spasm: Secondary | ICD-10-CM

## 2019-09-22 DIAGNOSIS — E1169 Type 2 diabetes mellitus with other specified complication: Secondary | ICD-10-CM

## 2019-09-22 DIAGNOSIS — E785 Hyperlipidemia, unspecified: Secondary | ICD-10-CM

## 2019-09-22 MED ORDER — TIZANIDINE HCL 2 MG PO CAPS: 2.0000 mg | ORAL_CAPSULE | Freq: Three times a day (TID) | ORAL | 0 refills | Status: DC

## 2019-09-22 MED ORDER — OMEPRAZOLE 40 MG PO CPDR
40.0000 mg | DELAYED_RELEASE_CAPSULE | Freq: Every day | ORAL | 3 refills | Status: DC
Start: 2019-09-22 — End: 2020-02-22

## 2019-09-22 MED ORDER — CELECOXIB 200 MG PO CAPS: 200.0000 mg | ORAL_CAPSULE | Freq: Every day | ORAL | 1 refills | Status: DC

## 2019-09-22 NOTE — Patient Instructions (Addendum)
YOU NEED 60 OUNCES OF NON CAFFEINATED LIQUIDS DAILY!   [place],  TRUELIME,  ETC ARE NATURAL AND GREAT TASTING   ULTRASOUND OF RIGHT LEG ORDERED   RETURN I N  NOVEMBER FOR PAP SMEAR AND PRE VISIT LABS   CELEBREX ONCE DAILY AND OMEPRAZOLE ONCE DAILY  FOR PAIN  REFERRAL TO PHYSICAL THERAPY ONCE I SEE YOUR  X RAYS
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
## 2019-09-25 DIAGNOSIS — M5387 Other specified dorsopathies, lumbosacral region: Secondary | ICD-10-CM | POA: Insufficient documentation

## 2019-09-25 DIAGNOSIS — M5412 Radiculopathy, cervical region: Secondary | ICD-10-CM | POA: Insufficient documentation

## 2019-09-25 NOTE — Assessment & Plan Note (Signed)
Cervical spine films showed minimal degenerative changes.  Given her FH of MS, will recommend referral to Neurology for further evaluation

## 2019-09-25 NOTE — Assessment & Plan Note (Addendum)
Moderate disk disease at L5 noted on palin films. PT referral in progress to [facility].  Celebrex and omeprazole prescribed.

## 2019-09-30 ENCOUNTER — Ambulatory Visit: Payer: BC Managed Care – PPO

## 2019-09-30 ENCOUNTER — Other Ambulatory Visit: Payer: Self-pay

## 2019-09-30 ENCOUNTER — Ambulatory Visit
Admission: RE | Admit: 2019-09-30 | Discharge: 2019-09-30 | Disposition: A | Discharge status: Home / Self Care | Payer: BC Managed Care – PPO | Source: Ambulatory Visit | Attending: Internal Medicine | Admitting: Internal Medicine

## 2019-09-30 DIAGNOSIS — M79661 Pain in right lower leg: Secondary | ICD-10-CM | POA: Diagnosis not present

## 2019-09-30 DIAGNOSIS — M79604 Pain in right leg: Secondary | ICD-10-CM | POA: Diagnosis not present

## 2019-09-30 DIAGNOSIS — M79651 Pain in right thigh: Secondary | ICD-10-CM | POA: Insufficient documentation

## 2019-10-04 DIAGNOSIS — M545 Low back pain: Secondary | ICD-10-CM | POA: Diagnosis not present

## 2019-10-11 DIAGNOSIS — M545 Low back pain: Secondary | ICD-10-CM | POA: Diagnosis not present

## 2019-10-18 ENCOUNTER — Other Ambulatory Visit: Payer: Self-pay | Admitting: Internal Medicine

## 2019-10-18 DIAGNOSIS — Z1231 Encounter for screening mammogram for malignant neoplasm of breast: Secondary | ICD-10-CM

## 2019-10-19 DIAGNOSIS — M545 Low back pain: Secondary | ICD-10-CM | POA: Diagnosis not present

## 2019-12-07 ENCOUNTER — Other Ambulatory Visit: Payer: Self-pay | Admitting: Internal Medicine

## 2019-12-07 ENCOUNTER — Ambulatory Visit
Admission: RE | Admit: 2019-12-07 | Discharge: 2019-12-07 | Disposition: A | Discharge status: Home / Self Care | Payer: BC Managed Care – PPO | Source: Ambulatory Visit | Attending: Internal Medicine | Admitting: Internal Medicine

## 2019-12-07 ENCOUNTER — Other Ambulatory Visit: Payer: Self-pay

## 2019-12-07 DIAGNOSIS — Z1231 Encounter for screening mammogram for malignant neoplasm of breast: Secondary | ICD-10-CM | POA: Diagnosis not present

## 2019-12-22 ENCOUNTER — Telehealth: Payer: Self-pay | Admitting: Internal Medicine

## 2019-12-22 NOTE — Telephone Encounter (Signed)
lft msg on vm [facility] pt regarding referral.

## 2020-01-02 DIAGNOSIS — H900 Conductive hearing loss, bilateral: Secondary | ICD-10-CM | POA: Diagnosis not present

## 2020-01-02 DIAGNOSIS — H722X2 Other marginal perforations of tympanic membrane, left ear: Secondary | ICD-10-CM | POA: Diagnosis not present

## 2020-01-02 DIAGNOSIS — H90A12 Conductive hearing loss, unilateral, left ear with restricted hearing on the contralateral side: Secondary | ICD-10-CM | POA: Diagnosis not present

## 2020-01-02 DIAGNOSIS — H6983 Other specified disorders of Eustachian tube, bilateral: Secondary | ICD-10-CM | POA: Diagnosis not present

## 2020-01-09 DIAGNOSIS — M5136 Other intervertebral disc degeneration, lumbar region: Secondary | ICD-10-CM | POA: Diagnosis not present

## 2020-01-09 DIAGNOSIS — M9904 Segmental and somatic dysfunction of sacral region: Secondary | ICD-10-CM | POA: Diagnosis not present

## 2020-01-09 DIAGNOSIS — M9905 Segmental and somatic dysfunction of pelvic region: Secondary | ICD-10-CM | POA: Diagnosis not present

## 2020-01-09 DIAGNOSIS — M955 Acquired deformity of pelvis: Secondary | ICD-10-CM | POA: Diagnosis not present

## 2020-01-11 DIAGNOSIS — F331 Major depressive disorder, recurrent, moderate: Secondary | ICD-10-CM | POA: Diagnosis not present

## 2020-01-11 DIAGNOSIS — M5136 Other intervertebral disc degeneration, lumbar region: Secondary | ICD-10-CM | POA: Diagnosis not present

## 2020-01-11 DIAGNOSIS — M9904 Segmental and somatic dysfunction of sacral region: Secondary | ICD-10-CM | POA: Diagnosis not present

## 2020-01-11 DIAGNOSIS — M955 Acquired deformity of pelvis: Secondary | ICD-10-CM | POA: Diagnosis not present

## 2020-01-11 DIAGNOSIS — M9905 Segmental and somatic dysfunction of pelvic region: Secondary | ICD-10-CM | POA: Diagnosis not present

## 2020-01-11 DIAGNOSIS — F411 Generalized anxiety disorder: Secondary | ICD-10-CM | POA: Diagnosis not present

## 2020-01-12 DIAGNOSIS — M9905 Segmental and somatic dysfunction of pelvic region: Secondary | ICD-10-CM | POA: Diagnosis not present

## 2020-01-12 DIAGNOSIS — M9904 Segmental and somatic dysfunction of sacral region: Secondary | ICD-10-CM | POA: Diagnosis not present

## 2020-01-12 DIAGNOSIS — M5136 Other intervertebral disc degeneration, lumbar region: Secondary | ICD-10-CM | POA: Diagnosis not present

## 2020-01-12 DIAGNOSIS — M955 Acquired deformity of pelvis: Secondary | ICD-10-CM | POA: Diagnosis not present

## 2020-01-16 DIAGNOSIS — M955 Acquired deformity of pelvis: Secondary | ICD-10-CM | POA: Diagnosis not present

## 2020-01-16 DIAGNOSIS — M5136 Other intervertebral disc degeneration, lumbar region: Secondary | ICD-10-CM | POA: Diagnosis not present

## 2020-01-16 DIAGNOSIS — M9905 Segmental and somatic dysfunction of pelvic region: Secondary | ICD-10-CM | POA: Diagnosis not present

## 2020-01-16 DIAGNOSIS — M9904 Segmental and somatic dysfunction of sacral region: Secondary | ICD-10-CM | POA: Diagnosis not present

## 2020-01-18 DIAGNOSIS — F411 Generalized anxiety disorder: Secondary | ICD-10-CM | POA: Diagnosis not present

## 2020-01-18 DIAGNOSIS — F331 Major depressive disorder, recurrent, moderate: Secondary | ICD-10-CM | POA: Diagnosis not present

## 2020-01-19 DIAGNOSIS — M9905 Segmental and somatic dysfunction of pelvic region: Secondary | ICD-10-CM | POA: Diagnosis not present

## 2020-01-19 DIAGNOSIS — M9904 Segmental and somatic dysfunction of sacral region: Secondary | ICD-10-CM | POA: Diagnosis not present

## 2020-01-19 DIAGNOSIS — M955 Acquired deformity of pelvis: Secondary | ICD-10-CM | POA: Diagnosis not present

## 2020-01-19 DIAGNOSIS — M5136 Other intervertebral disc degeneration, lumbar region: Secondary | ICD-10-CM | POA: Diagnosis not present

## 2020-01-23 DIAGNOSIS — M955 Acquired deformity of pelvis: Secondary | ICD-10-CM | POA: Diagnosis not present

## 2020-01-23 DIAGNOSIS — M9904 Segmental and somatic dysfunction of sacral region: Secondary | ICD-10-CM | POA: Diagnosis not present

## 2020-01-23 DIAGNOSIS — M9905 Segmental and somatic dysfunction of pelvic region: Secondary | ICD-10-CM | POA: Diagnosis not present

## 2020-01-23 DIAGNOSIS — M5136 Other intervertebral disc degeneration, lumbar region: Secondary | ICD-10-CM | POA: Diagnosis not present

## 2020-01-25 DIAGNOSIS — F331 Major depressive disorder, recurrent, moderate: Secondary | ICD-10-CM | POA: Diagnosis not present

## 2020-01-25 DIAGNOSIS — F411 Generalized anxiety disorder: Secondary | ICD-10-CM | POA: Diagnosis not present

## 2020-01-26 DIAGNOSIS — M955 Acquired deformity of pelvis: Secondary | ICD-10-CM | POA: Diagnosis not present

## 2020-01-26 DIAGNOSIS — M9904 Segmental and somatic dysfunction of sacral region: Secondary | ICD-10-CM | POA: Diagnosis not present

## 2020-01-26 DIAGNOSIS — M5136 Other intervertebral disc degeneration, lumbar region: Secondary | ICD-10-CM | POA: Diagnosis not present

## 2020-01-26 DIAGNOSIS — M9905 Segmental and somatic dysfunction of pelvic region: Secondary | ICD-10-CM | POA: Diagnosis not present

## 2020-01-30 DIAGNOSIS — M9905 Segmental and somatic dysfunction of pelvic region: Secondary | ICD-10-CM | POA: Diagnosis not present

## 2020-01-30 DIAGNOSIS — M9904 Segmental and somatic dysfunction of sacral region: Secondary | ICD-10-CM | POA: Diagnosis not present

## 2020-01-30 DIAGNOSIS — M5136 Other intervertebral disc degeneration, lumbar region: Secondary | ICD-10-CM | POA: Diagnosis not present

## 2020-01-30 DIAGNOSIS — M955 Acquired deformity of pelvis: Secondary | ICD-10-CM | POA: Diagnosis not present

## 2020-02-01 DIAGNOSIS — M9904 Segmental and somatic dysfunction of sacral region: Secondary | ICD-10-CM | POA: Diagnosis not present

## 2020-02-01 DIAGNOSIS — M9905 Segmental and somatic dysfunction of pelvic region: Secondary | ICD-10-CM | POA: Diagnosis not present

## 2020-02-01 DIAGNOSIS — M955 Acquired deformity of pelvis: Secondary | ICD-10-CM | POA: Diagnosis not present

## 2020-02-01 DIAGNOSIS — M5136 Other intervertebral disc degeneration, lumbar region: Secondary | ICD-10-CM | POA: Diagnosis not present

## 2020-02-02 DIAGNOSIS — H906 Mixed conductive and sensorineural hearing loss, bilateral: Secondary | ICD-10-CM | POA: Diagnosis not present

## 2020-02-03 ENCOUNTER — Ambulatory Visit (INDEPENDENT_AMBULATORY_CARE_PROVIDER_SITE_OTHER): Payer: BC Managed Care – PPO

## 2020-02-03 ENCOUNTER — Other Ambulatory Visit: Payer: Self-pay

## 2020-02-03 DIAGNOSIS — Z23 Encounter for immunization: Secondary | ICD-10-CM

## 2020-02-06 DIAGNOSIS — M9905 Segmental and somatic dysfunction of pelvic region: Secondary | ICD-10-CM | POA: Diagnosis not present

## 2020-02-06 DIAGNOSIS — M5136 Other intervertebral disc degeneration, lumbar region: Secondary | ICD-10-CM | POA: Diagnosis not present

## 2020-02-06 DIAGNOSIS — M9904 Segmental and somatic dysfunction of sacral region: Secondary | ICD-10-CM | POA: Diagnosis not present

## 2020-02-06 DIAGNOSIS — M955 Acquired deformity of pelvis: Secondary | ICD-10-CM | POA: Diagnosis not present

## 2020-02-09 DIAGNOSIS — F411 Generalized anxiety disorder: Secondary | ICD-10-CM | POA: Diagnosis not present

## 2020-02-09 DIAGNOSIS — F331 Major depressive disorder, recurrent, moderate: Secondary | ICD-10-CM | POA: Diagnosis not present

## 2020-02-15 DIAGNOSIS — F331 Major depressive disorder, recurrent, moderate: Secondary | ICD-10-CM | POA: Diagnosis not present

## 2020-02-15 DIAGNOSIS — F411 Generalized anxiety disorder: Secondary | ICD-10-CM | POA: Diagnosis not present

## 2020-02-20 ENCOUNTER — Other Ambulatory Visit (INDEPENDENT_AMBULATORY_CARE_PROVIDER_SITE_OTHER): Payer: BC Managed Care – PPO

## 2020-02-20 ENCOUNTER — Other Ambulatory Visit: Payer: Self-pay

## 2020-02-20 DIAGNOSIS — E785 Hyperlipidemia, unspecified: Secondary | ICD-10-CM

## 2020-02-20 DIAGNOSIS — E1169 Type 2 diabetes mellitus with other specified complication: Secondary | ICD-10-CM

## 2020-02-20 DIAGNOSIS — R252 Cramp and spasm: Secondary | ICD-10-CM | POA: Diagnosis not present

## 2020-02-20 LAB — COMPREHENSIVE METABOLIC PANEL
ALT: 12 U/L (ref 0–35)
AST: 19 U/L (ref 0–37)
Albumin: 4.3 g/dL (ref 3.5–5.2)
Alkaline Phosphatase: 80 U/L (ref 39–117)
BUN: 14 mg/dL (ref 6–23)
CO2: 30 mEq/L (ref 19–32)
Calcium: 9.3 mg/dL (ref 8.4–10.5)
Chloride: 102 mEq/L (ref 96–112)
Creatinine, Ser: 0.85 mg/dL (ref 0.40–1.20)
GFR: 76.7 mL/min (ref >60.00)
Glucose, Bld: 86 mg/dL (ref 70–99)
Potassium: 3.9 mEq/L (ref 3.5–5.1)
Sodium: 138 mEq/L (ref 135–145)
Total Bilirubin: 0.6 mg/dL (ref 0.2–1.2)
Total Protein: 7 g/dL (ref 6.0–8.3)

## 2020-02-20 LAB — TSH: TSH: 5.31 u[IU]/mL — ABNORMAL HIGH (ref 0.35–4.50)

## 2020-02-20 LAB — LIPID PANEL
Cholesterol: 125 mg/dL (ref 0–200)
HDL: 54.8 mg/dL (ref >39.00)
LDL Cholesterol: 59 mg/dL (ref 0–99)
NonHDL: 70.51
Total CHOL/HDL Ratio: 2
Triglycerides: 57 mg/dL (ref 0.0–149.0)
VLDL: 11.4 mg/dL (ref 0.0–40.0)

## 2020-02-22 ENCOUNTER — Other Ambulatory Visit: Payer: Self-pay

## 2020-02-22 ENCOUNTER — Encounter: Payer: Self-pay | Admitting: Internal Medicine

## 2020-02-22 ENCOUNTER — Ambulatory Visit (INDEPENDENT_AMBULATORY_CARE_PROVIDER_SITE_OTHER): Payer: BC Managed Care – PPO | Admitting: Internal Medicine

## 2020-02-22 ENCOUNTER — Other Ambulatory Visit (HOSPITAL_COMMUNITY)
Admission: RE | Admit: 2020-02-22 | Discharge: 2020-02-22 | Disposition: A | Discharge status: Home / Self Care | Payer: BC Managed Care – PPO | Source: Ambulatory Visit | Attending: Internal Medicine | Admitting: Internal Medicine

## 2020-02-22 VITALS — BP 126/76 | HR 94 | Temp 98.4°F | Resp 14 | Ht 64.0 in | Wt 147.2 lb

## 2020-02-22 DIAGNOSIS — Z0000 Encounter for general adult medical examination without abnormal findings: Secondary | ICD-10-CM | POA: Diagnosis not present

## 2020-02-22 DIAGNOSIS — F430 Acute stress reaction: Secondary | ICD-10-CM | POA: Diagnosis not present

## 2020-02-22 DIAGNOSIS — F411 Generalized anxiety disorder: Secondary | ICD-10-CM | POA: Diagnosis not present

## 2020-02-22 DIAGNOSIS — Z124 Encounter for screening for malignant neoplasm of cervix: Secondary | ICD-10-CM | POA: Diagnosis not present

## 2020-02-22 MED ORDER — SERTRALINE HCL 50 MG PO TABS: 50.0000 mg | ORAL_TABLET | Freq: Every day | ORAL | 0 refills | Status: DC

## 2020-02-22 NOTE — Patient Instructions (Signed)
No treatment is needed for a mildly elevated TSH (sub clinical hypothyroidism).  We will repeat the blood test in 3 months   Please start the sertraline at 1/2 tablet daily with a big breakfast for the first week  to avoid nausea.  You can increase to a full tablet after 6 days if you havenot developed side effects of nausea.  If the lexapro interferes with your sleep, take it in the morning instead  Please return in  3  weeks ,  Or e mail me to let me know how it is helping your depression   Health Maintenance for Postmenopausal Women Menopause is a normal process in which your ability to get pregnant comes to an end. This process happens slowly over many months or years, usually between the ages of 82 and 77. Menopause is complete when you have missed your menstrual periods for 12 months. It is important to talk with your health care provider about some of the most common conditions that affect women after menopause (postmenopausal women). These include heart disease, cancer, and bone loss (osteoporosis). Adopting a healthy lifestyle and getting preventive care can help to promote your health and wellness. The actions you take can also lower your chances of developing some of these common conditions. What should I know about menopause? During menopause, you may get a number of symptoms, such as:  Hot flashes. These can be moderate or severe.  Night sweats.  Decrease in sex drive.  Mood swings.  Headaches.  Tiredness.  Irritability.  Memory problems.  Insomnia. Choosing to treat or not to treat these symptoms is a decision that you make with your health care provider. Do I need hormone replacement therapy?  Hormone replacement therapy is effective in treating symptoms that are caused by menopause, such as hot flashes and night sweats.  Hormone replacement carries certain risks, especially as you become older. If you are thinking about using estrogen or estrogen with progestin,  discuss the benefits and risks with your health care provider. What is my risk for heart disease and stroke? The risk of heart disease, heart attack, and stroke increases as you age. One of the causes may be a change in the body's hormones during menopause. This can affect how your body uses dietary fats, triglycerides, and cholesterol. Heart attack and stroke are medical emergencies. There are many things that you can do to help prevent heart disease and stroke. Watch your blood pressure  High blood pressure causes heart disease and increases the risk of stroke. This is more likely to develop in people who have high blood pressure readings, are of African descent, or are overweight.  Have your blood pressure checked: ? Every 3-5 years if you are 43-42 years of age. ? Every year if you are 1 years old or older. Eat a healthy diet   Eat a diet that includes plenty of vegetables, fruits, low-fat dairy products, and lean protein.  Do not eat a lot of foods that are high in solid fats, added sugars, or sodium. Get regular exercise Get regular exercise. This is one of the most important things you can do for your health. Most adults should:  Try to exercise for at least 150 minutes each week. The exercise should increase your heart rate and make you sweat (moderate-intensity exercise).  Try to do strengthening exercises at least twice each week. Do these in addition to the moderate-intensity exercise.  Spend less time sitting. Even light physical activity can be beneficial.  Other tips  Work with your health care provider to achieve or maintain a healthy weight.  Do not use any products that contain nicotine or tobacco, such as cigarettes, e-cigarettes, and chewing tobacco. If you need help quitting, ask your health care provider.  Know your numbers. Ask your health care provider to check your cholesterol and your blood sugar (glucose). Continue to have your blood tested as directed by your  health care provider. Do I need screening for cancer? Depending on your health history and family history, you may need to have cancer screening at different stages of your life. This may include screening for:  Breast cancer.  Cervical cancer.  Lung cancer.  Colorectal cancer. What is my risk for osteoporosis? After menopause, you may be at increased risk for osteoporosis. Osteoporosis is a condition in which bone destruction happens more quickly than new bone creation. To help prevent osteoporosis or the bone fractures that can happen because of osteoporosis, you may take the following actions:  If you are 38-33 years old, get at least 1,000 mg of calcium and at least 600 mg of vitamin D per day.  If you are older than age 52 but younger than age 33, get at least 1,200 mg of calcium and at least 600 mg of vitamin D per day.  If you are older than age 58, get at least 1,200 mg of calcium and at least 800 mg of vitamin D per day. Smoking and drinking excessive alcohol increase the risk of osteoporosis. Eat foods that are rich in calcium and vitamin D, and do weight-bearing exercises several times each week as directed by your health care provider. How does menopause affect my mental health? Depression may occur at any age, but it is more common as you become older. Common symptoms of depression include:  Low or sad mood.  Changes in sleep patterns.  Changes in appetite or eating patterns.  Feeling an overall lack of motivation or enjoyment of activities that you previously enjoyed.  Frequent crying spells. Talk with your health care provider if you think that you are experiencing depression. General instructions See your health care provider for regular wellness exams and vaccines. This may include:  Scheduling regular health, dental, and eye exams.  Getting and maintaining your vaccines. These include: ? Influenza vaccine. Get this vaccine each year before the flu season  begins. ? Pneumonia vaccine. ? Shingles vaccine. ? Tetanus, diphtheria, and pertussis (Tdap) booster vaccine. Your health care provider may also recommend other immunizations. Tell your health care provider if you have ever been abused or do not feel safe at home. Summary  Menopause is a normal process in which your ability to get pregnant comes to an end.  This condition causes hot flashes, night sweats, decreased interest in sex, mood swings, headaches, or lack of sleep.  Treatment for this condition may include hormone replacement therapy.  Take actions to keep yourself healthy, including exercising regularly, eating a healthy diet, watching your weight, and checking your blood pressure and blood sugar levels.  Get screened for cancer and depression. Make sure that you are up to date with all your vaccines. This information is not intended to replace advice given to you by your health care provider. Make sure you discuss any questions you have with your health care provider. Document Revised: 03/24/2018 Document Reviewed: 03/24/2018 Elsevier Patient Education  2020 [employer].

## 2020-02-22 NOTE — Progress Notes (Signed)
Patient ID: Cynthia Ruiz, female    DOB: 01/02/1966  Age: 54 y.o. MRN: [id number]  The patient is here for annual PREVENTIVE  examination and management of other chronic and acute problems.   The risk factors are reflected in the social history.  The roster of all physicians providing medical care to patient - is listed in the Snapshot section of the chart.  Activities of daily living:  The patient is 100% independent in all ADLs: dressing, toileting, feeding as well as independent mobility  Home safety : The patient has smoke detectors in the home. They wear seatbelts.  There are no firearms at home. There is no violence in the home.   There is no risks for hepatitis, STDs or HIV. There is no   history of blood transfusion. They have no travel history to infectious disease endemic areas of the world.  The patient has seen their dentist in the last six month. They have seen their eye doctor in the last year. They admit to slight hearing difficulty with regard to whispered voices and some television programs.  They have deferred audiologic testing in the last year.  They do not  have excessive sun exposure. Discussed the need for sun protection: hats, long sleeves and use of sunscreen if there is significant sun exposure.   Diet: the importance of a healthy diet is discussed. They do have a healthy diet.  The benefits of regular aerobic exercise were discussed. She walks 4 times per week ,  20 minutes.   Depression screen: there are no signs or vegative symptoms of depression- irritability, change in appetite, anhedonia, sadness/tearfullness.  Cognitive assessment: the patient manages all their financial and personal affairs and is actively engaged. They could relate day,date,year and events; recalled 2/3 objects at 3 minutes; performed clock-face test normally.  The following portions of the patient's history were reviewed and updated as appropriate: allergies, current medications, past  family history, past medical history,  past surgical history, past social history  and problem list.  Visual acuity was not assessed per patient preference since she has regular follow up with her ophthalmologist. Hearing and body mass index were assessed and reviewed.   During the course of the visit the patient was educated and counseled about appropriate screening and preventive services including : fall prevention , diabetes screening, nutrition counseling, colorectal cancer screening, and recommended immunizations.    CC: The primary encounter diagnosis was Cervical cancer screening. Diagnoses of Routine general medical examination at a health care facility and Anxiety as acute reaction to gross stress were also pertinent to this visit.  1) History of  overweight:  Has maintained the 20 lb wt loss since 2019.  Feels it is due to stress because she is not eating well due to stress.  2) Emotional Stress:  30 yr old Daughter with Down's Syndrome has been in a period of regression for several weeks: with emotional lability,  Not sleeping,  Not eating.  Noticing weight loss.  Won't go to class.  She is seeing a new psychiatrist ,  Has appt at [facility] with a neuroscientist who works with Down's children.  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  Patient has returned to counselling because she has no support from family (sister Alexia Freestone Folkert's is a constant drain on her time and energy due to her needs) . Having a difficult time delegating to husband even  for an evening.  Discussed her need to start medication . Reluctant to do so because of her mother's addiction to pain medication  3) conductive hearing loss:  Received hearing aides last week  Still adjusting to them .  Feels dizzy at times     History Paytyn has a past medical history of Anxiety.   She has a past surgical history that  includes Knee arthroscopy (Right, 1991); Pyloric Stenosis surgery; Tubal ligation (2007); Cesarean section (1991, 2002, 2007); and Colonoscopy with propofol (N/A, 07/18/2015).   Her family history includes Arthritis (age of onset: 105) in her mother; Cancer in her maternal grandmother and paternal grandmother; Cancer (age of onset: 72) in her father; Colon cancer in her maternal uncle; Diabetes in her paternal grandfather; Down syndrome in her daughter; Glaucoma in her father; Hypertension in her father; Multiple sclerosis (age of onset: 58) in her sister.She reports that she has never smoked. She has never used smokeless tobacco. She reports current alcohol use. She reports that she does not use drugs.  Outpatient Medications Prior to Visit  Medication Sig Dispense Refill  . celecoxib (CELEBREX) 200 MG capsule TAKE 1 CAPSULE BY MOUTH EVERY DAY (Patient taking differently: Take 200 mg by mouth as needed. ) 30 capsule 1  . cetirizine (ZYRTEC) 5 MG tablet Take 10 mg by mouth daily.    Marland Kitchen omeprazole (PRILOSEC) 40 MG capsule Take 1 capsule (40 mg total) by mouth daily. 30 capsule 3  . tizanidine (ZANAFLEX) 2 MG capsule Take 1 capsule (2 mg total) by mouth 3 (three) times daily. 60 capsule 0   No facility-administered medications prior to visit.    Review of Systems  Objective:  BP 126/76 (BP Location: Left Arm, Patient Position: Sitting, Cuff Size: Normal)   Pulse 94   Temp 98.4 F (36.9 C) (Oral)   Resp 14   Ht 5\' 4"  (1.626 m)   Wt 147 lb 3.2 oz (66.8 kg)   SpO2 97%   BMI 25.27 kg/m   Physical Exam    Assessment & Plan:   Problem List Items Addressed This Visit      Unprioritized   Anxiety as acute reaction to gross stress    She has been unable to sleep and eat due to her daughter's distress.  Discussed and recommended trial of zoloft.       Relevant Medications   sertraline (ZOLOFT) 50 MG tablet   Routine general medical examination at a health care facility    age appropriate  education and counseling updated, referrals for preventative services and immunizations addressed, dietary and smoking counseling addressed, most recent labs reviewed.  I have personally reviewed and have noted:  1) the patient's medical and social history 2) The pt's use of alcohol, tobacco, and illicit drugs 3) The patient's current medications and supplements 4) Functional ability including ADL's, fall risk, home safety risk, hearing and visual impairment 5) Diet and physical activities 6) Evidence for depression or mood disorder 7) The patient's height, weight, and BMI have been recorded in the chart  I have made referrals, and provided counseling and education based on review of the above       Other Visit Diagnoses    Cervical cancer screening    -  Primary   Relevant Orders   Cytology - PAP( [place])      I have discontinued "[name]"'s cetirizine, tizanidine, and omeprazole. I am also having her start on sertraline. Additionally, I am having her  maintain her celecoxib.  Meds ordered this encounter  Medications  . sertraline (ZOLOFT) 50 MG tablet    Sig: Take 1 tablet (50 mg total) by mouth daily.    Dispense:  90 tablet    Refill:  0    Medications Discontinued During This Encounter  Medication Reason  . omeprazole (PRILOSEC) 40 MG capsule   . tizanidine (ZANAFLEX) 2 MG capsule   . cetirizine (ZYRTEC) 5 MG tablet     Follow-up: No follow-ups on file.   Sherlene Shams, MD

## 2020-02-23 LAB — CYTOLOGY - PAP
Comment: NEGATIVE
Diagnosis: NEGATIVE
High risk HPV: NEGATIVE

## 2020-02-23 NOTE — Assessment & Plan Note (Signed)
She has been unable to sleep and eat due to her daughter's distress.  Discussed and recommended trial of zoloft.

## 2020-02-23 NOTE — Assessment & Plan Note (Signed)

## 2020-02-24 NOTE — Progress Notes (Signed)
  I am happy to inform you that your PAP smear was normal,  And your  HPV screen was negative.  We will repeat your cervical cancer screening in 3 years .  Regards,   Copeland Lapier, MD    

## 2020-02-29 DIAGNOSIS — F411 Generalized anxiety disorder: Secondary | ICD-10-CM | POA: Diagnosis not present

## 2020-02-29 DIAGNOSIS — F331 Major depressive disorder, recurrent, moderate: Secondary | ICD-10-CM | POA: Diagnosis not present

## 2020-03-19 ENCOUNTER — Other Ambulatory Visit: Payer: Self-pay | Admitting: Internal Medicine

## 2020-03-19 MED ORDER — PREDNISONE 10 MG PO TABS: ORAL_TABLET | ORAL | 0 refills | Status: DC

## 2020-03-21 DIAGNOSIS — F331 Major depressive disorder, recurrent, moderate: Secondary | ICD-10-CM | POA: Diagnosis not present

## 2020-03-21 DIAGNOSIS — F411 Generalized anxiety disorder: Secondary | ICD-10-CM | POA: Diagnosis not present

## 2020-04-01 ENCOUNTER — Other Ambulatory Visit: Payer: Self-pay | Admitting: Internal Medicine

## 2020-04-01 DIAGNOSIS — F411 Generalized anxiety disorder: Secondary | ICD-10-CM

## 2020-04-01 NOTE — Assessment & Plan Note (Signed)
Not tolerating zoloft due to nausea

## 2020-04-18 DIAGNOSIS — F411 Generalized anxiety disorder: Secondary | ICD-10-CM | POA: Diagnosis not present

## 2020-04-18 DIAGNOSIS — F331 Major depressive disorder, recurrent, moderate: Secondary | ICD-10-CM | POA: Diagnosis not present

## 2020-05-09 DIAGNOSIS — F331 Major depressive disorder, recurrent, moderate: Secondary | ICD-10-CM | POA: Diagnosis not present

## 2020-05-09 DIAGNOSIS — F411 Generalized anxiety disorder: Secondary | ICD-10-CM | POA: Diagnosis not present

## 2020-05-24 ENCOUNTER — Other Ambulatory Visit: Payer: BC Managed Care – PPO

## 2020-06-06 DIAGNOSIS — F331 Major depressive disorder, recurrent, moderate: Secondary | ICD-10-CM | POA: Diagnosis not present

## 2020-06-06 DIAGNOSIS — F411 Generalized anxiety disorder: Secondary | ICD-10-CM | POA: Diagnosis not present

## 2020-06-13 ENCOUNTER — Other Ambulatory Visit: Payer: Self-pay

## 2020-06-13 ENCOUNTER — Telehealth: Payer: Self-pay | Admitting: Internal Medicine

## 2020-06-13 ENCOUNTER — Encounter: Payer: Self-pay | Admitting: Internal Medicine

## 2020-06-13 ENCOUNTER — Telehealth (INDEPENDENT_AMBULATORY_CARE_PROVIDER_SITE_OTHER): Payer: BC Managed Care – PPO | Admitting: Internal Medicine

## 2020-06-13 VITALS — Ht 64.0 in | Wt 142.0 lb

## 2020-06-13 DIAGNOSIS — W57XXXA Bitten or stung by nonvenomous insect and other nonvenomous arthropods, initial encounter: Secondary | ICD-10-CM

## 2020-06-13 DIAGNOSIS — S0096XA Insect bite (nonvenomous) of unspecified part of head, initial encounter: Secondary | ICD-10-CM

## 2020-06-13 MED ORDER — DOXYCYCLINE HYCLATE 100 MG PO TABS: 100.0000 mg | ORAL_TABLET | Freq: Two times a day (BID) | ORAL | 0 refills | Status: DC

## 2020-06-13 NOTE — Patient Instructions (Signed)
Insect Bite, Adult An insect bite can make your skin red, itchy, and swollen. An insect bite is different from an insect sting, which happens when an insect injects poison (venom) into the skin. Some insects can spread disease to people through a bite. However, most insect bites do not lead to disease and are not serious. What are the causes? Insects may bite for a variety of reasons, including:  Hunger.  To defend themselves. Insects that bite include:  Spiders.  Mosquitoes.  Ticks.  Fleas.  Ants.  Flies.  Kissing bugs.  Chiggers. What are the signs or symptoms? Symptoms of this condition include:  Itching or pain in the bite area.  Redness and swelling in the bite area.  An open wound (skin ulcer). In many cases, symptoms last for 2-4 days. In rare cases, a person may have a severe allergic reaction (anaphylactic reaction) to a bite. Symptoms of an anaphylactic reaction may include:  Feeling warm in the face (flushed). This may include redness.  Itchy, red, swollen areas of skin (hives).  Swelling of the eyes, lips, face, mouth, tongue, or throat.  Difficulty breathing, speaking, or swallowing.  Noisy breathing (wheezing).  Dizziness or light-headedness.  Fainting.  Pain or cramping in the abdomen.  Vomiting.  Diarrhea. How is this diagnosed? This condition is usually diagnosed based on symptoms and a physical exam. How is this treated? Treatment is usually not needed. Symptoms often go away on their own. When treatment is recommended, it may involve:  Applying a cream or lotion to the bite area. This treatment helps with itching.  Taking an antibiotic medicine. This treatment is needed if the bite area gets infected.  Getting a tetanus shot, if you are not up to date on this vaccine.  Applying ice to the affected area.  Allergy medicines called antihistamines. This treatment may be needed if you develop itching or an allergic reaction to the  insect bite.  Giving yourself an epinephrine injection if you have an anaphylactic reaction to a bite. To give the injection, you will use what is commonly called an auto-injector "pen" (pre-filled automatic epinephrine injection device). Your health care provider will teach you how to use an auto-injector pen. Follow these instructions at home: Bite area care  Do not scratch the bite area.  Keep the bite area clean and dry. Wash it every day with soap and water as told by your health care provider.  Check the bite area every day for signs of infection. Check for: ? Redness, swelling, or pain. ? Fluid or blood. ? Warmth. ? Pus or a bad smell.   Managing pain, itching, and swelling  You may apply cortisone cream, calamine lotion, or a paste made of baking soda and water to the bite area as told by your health care provider.  If directed, put ice on the bite area. ? Put ice in a plastic bag. ? Place a towel between your skin and the bag. ? Leave the ice on for 20 minutes, 2-3 times a day.   General instructions  Apply or take over-the-counter and prescription medicines only as told by your health care provider.  If you were prescribed an antibiotic medicine, take or apply it as told by your health care provider. Do not stop using the antibiotic even if your condition improves.  Keep all follow-up visits as told by your health care provider. This is important. How is this prevented? To help reduce your risk of insect bites:  When you   are outdoors, wear clothing that covers your arms and legs. This is especially important in the early morning and evening.  Use insect repellent. The best insect repellents contain DEET, picaridin, oil of lemon eucalyptus (OLE), or IR3535.  Consider spraying your clothing with a pesticide called permethrin. Permethrin helps prevent insect bites. It works for several weeks and for up to 5-6 clothing washes. Do not apply permethrin directly to the  skin.  If your home windows do not have screens, consider installing them.  If you will be sleeping in an area where there are mosquitoes, consider covering your sleeping area with a mosquito net. Contact a health care provider if:  You have redness, swelling, or pain in the bite area.  You have fluid or blood coming from the bite area.  The bite area feels warm to the touch.  You have pus or a bad smell coming from the bite area.  You have a fever. Get help right away if:  You have joint pain.  You have a rash.  You feel unusually tired or sleepy.  You have neck pain.  You have a headache.  You have unusual weakness.  You develop symptoms of an anaphylactic reaction. These may include: ? Flushed skin. ? Hives. ? Swelling of the eyes, lips, face, mouth, tongue, or throat. ? Difficulty breathing, speaking, or swallowing. ? Wheezing. ? Dizziness or light-headedness. ? Fainting. ? Pain or cramping in the abdomen. ? Vomiting. ? Diarrhea. These symptoms may represent a serious problem that is an emergency. Do not wait to see if the symptoms will go away. Do the following right away:  Use the auto-injector pen as you have been instructed.  Get medical help. Call your local emergency services (911 in the [place] Do not drive yourself to the hospital. Summary  An insect bite can make your skin red, itchy, and swollen.  Treatment is usually not needed. Symptoms often go away on their own. When treatment is recommended, it may involve taking medicine, applying medicine to the area, or applying ice.  Apply or take over-the-counter and prescription medicines only as told by your health care provider.  Use insect repellent to help prevent insect bites.  Contact a health care provider if you have any signs of infection in the bite area. This information is not intended to replace advice given to you by your health care provider. Make sure you discuss any questions you have  with your health care provider. Document Revised: 10/09/2017 Document Reviewed: 10/09/2017 Elsevier Patient Education  2021 [employer]  

## 2020-06-13 NOTE — Progress Notes (Signed)
Bug bite on the cheek, Monday afternoon while raking outside. Unsure what bug may have bit her. Started to swell and redden yesterday, at first looked like a scratch. No pain but area is itchy.

## 2020-06-13 NOTE — Progress Notes (Signed)
Virtual Visit via Video Note  I connected with Cynthia Ruiz  on 06/13/20 at  2:00 PM EST by a video enabled telemedicine application and verified that I am speaking with the correct person using two identifiers.  Location patient: home, [place] Location provider:work or home office Persons participating in the virtual visit: patient, provider  I discussed the limitations of evaluation and management by telemedicine and the availability of in person appointments. The patient expressed understanding and agreed to proceed.   HPI: 1. Left cheek Monday working in the yard raking and Tuesday cheek red/swollen, hot. She tried essential oils peppermint which had cooling effect   -COVID-19 vaccine status: 3/3  ROS: See pertinent positives and negatives per HPI.  Past Medical History:  Diagnosis Date  . Anxiety     Past Surgical History:  Procedure Laterality Date  . CESAREAN SECTION  1991, 2002, 2007  . COLONOSCOPY WITH PROPOFOL N/A 07/18/2015   Procedure: COLONOSCOPY WITH PROPOFOL;  Surgeon: Earline Mayotte, MD;  Location[facility] ENDOSCOPY;  Service: Endoscopy;  Laterality: N/A;  . KNEE ARTHROSCOPY Right 1991  . Pyloric Stenosis surgery    . TUBAL LIGATION  2007     Current Outpatient Medications:  .  doxycycline (VIBRA-TABS) 100 MG tablet, Take 1 tablet (100 mg total) by mouth 2 (two) times daily. With food x 7-10 days, Disp: 20 tablet, Rfl: 0  EXAM:  VITALS per patient if applicable:  GENERAL: alert, oriented, appears well and in no acute distress  HEENT: atraumatic, conjunttiva clear, no obvious abnormalities on inspection of external nose and ears  NECK: normal movements of the head and neck  LUNGS: on inspection no signs of respiratory distress, breathing rate appears normal, no obvious gross SOB, gasping or wheezing  CV: no obvious cyanosis  MS: moves all visible extremities without noticeable abnormality  PSYCH/NEURO: pleasant and cooperative, no obvious depression or  anxiety, speech and thought processing grossly intact  Skin: left cheek cellulitis after insect bite   ASSESSMENT AND PLAN:  Discussed the following assessment and plan:  Insect bite of head, unspecified part, initial encounter - Plan: doxycycline (VIBRA-TABS) 100 MG tablet bid x 7-10 days Ice  Zyrtec    -we discussed possible serious and likely etiologies, options for evaluation and workup, limitations of telemedicine visit vs in person visit, treatment, treatment risks and precautions.   I discussed the assessment and treatment plan with the patient. The patient was provided an opportunity to ask questions and all were answered. The patient agreed with the plan and demonstrated an understanding of the instructions.    Time spent 20 min Bevelyn Buckles, MD

## 2020-06-15 ENCOUNTER — Telehealth: Payer: Self-pay

## 2020-06-15 NOTE — Telephone Encounter (Signed)
LVM to schedule follow up with PCP.

## 2020-07-05 DIAGNOSIS — F331 Major depressive disorder, recurrent, moderate: Secondary | ICD-10-CM | POA: Diagnosis not present

## 2020-07-05 DIAGNOSIS — F411 Generalized anxiety disorder: Secondary | ICD-10-CM | POA: Diagnosis not present

## 2020-07-18 ENCOUNTER — Other Ambulatory Visit: Payer: Self-pay

## 2020-07-18 ENCOUNTER — Ambulatory Visit (INDEPENDENT_AMBULATORY_CARE_PROVIDER_SITE_OTHER): Payer: BC Managed Care – PPO | Admitting: Internal Medicine

## 2020-07-18 ENCOUNTER — Encounter: Payer: Self-pay | Admitting: Internal Medicine

## 2020-07-18 DIAGNOSIS — Z974 Presence of external hearing-aid: Secondary | ICD-10-CM | POA: Diagnosis not present

## 2020-07-18 DIAGNOSIS — M5387 Other specified dorsopathies, lumbosacral region: Secondary | ICD-10-CM | POA: Diagnosis not present

## 2020-07-18 DIAGNOSIS — M62838 Other muscle spasm: Secondary | ICD-10-CM

## 2020-07-18 MED ORDER — TIZANIDINE HCL 4 MG PO TABS: 4.0000 mg | ORAL_TABLET | Freq: Every day | ORAL | 5 refills | Status: DC

## 2020-07-18 NOTE — Patient Instructions (Signed)
Nighttime regimen:  4 mg tizanidine  (muscle relaxer)  400 mg ibuprofen  (NSAID) 1000 mg tylenol (pain reliever)  Regular stretching of the neck and shoulders  And use of heat wraps

## 2020-07-18 NOTE — Progress Notes (Signed)
t  Subjective:  Patient ID: Cynthia Ruiz, female    DOB: 13-May-1965  Age: 55 y.o. MRN: [id number]  CC: Diagnoses of Sciatica associated with disorder of lumbosacral spine, Trapezius muscle spasm, and Wears hearing aid were pertinent to this visit.  HPI Cynthia Ruiz presents for evaluation and treatment of posterior neck pain radiating to left shoulder   This visit occurred during the SARS-CoV-2 public health emergency.  Safety protocols were in place, including screening questions prior to the visit, additional usage of staff PPE, and extensive cleaning of exam room while observing appropriate contact time as indicated for disinfecting solutions.   Cynthia Ruiz is a 55 yr old woman with a history of Mild bilateral cervical facet arthropathy without significant degenerative foraminal stenosis or  degenerative disc disease (by prior plain films June 2021) who presents with several week history of neck and shoulder pain that did not occur because of a fall or unusual activity.   She notes tightness of neck muscles and spasm of her trapezius muscles, and notes Restriction of ROM of head turn due to pain   Her previous evaluation  In June was done to evaluate complaints of right arm numbness brought on with side sleeping and intermittent sciatica. plain films in June noted transitional L5 level and  Mild degenerative disc disease at L4-5 with mild bilateral lower lumbar facet arthropathy   She was referred to and benefited from physical therapy but stopped after a few weeks because  "life got in the way." (She has a daughter with Down's Syndrome who had been  having increased emotional difficulty requiring numerous neuropsychiatric evaluations)   Since her last visit she has obtained Hearing aides.  Still has a lot of ambient distortion caused by air currents.    Has hole in left eardrum  New onset dizziness with head turn. Symptoms are transient and not brought on by standing up.      Outpatient  Medications Prior to Visit  Medication Sig Dispense Refill  . doxycycline (VIBRA-TABS) 100 MG tablet Take 1 tablet (100 mg total) by mouth 2 (two) times daily. With food x 7-10 days 20 tablet 0   No facility-administered medications prior to visit.    Review of Systems;  Patient denies headache, fevers, malaise, unintentional weight loss, skin rash, eye pain, sinus congestion and sinus pain, sore throat, dysphagia,  hemoptysis , cough, dyspnea, wheezing, chest pain, palpitations, orthopnea, edema, abdominal pain, nausea, melena, diarrhea, constipation, flank pain, dysuria, hematuria, urinary  Frequency, nocturia, numbness, tingling, seizures,  Focal weakness, Loss of consciousness,  Tremor, insomnia, depression, anxiety, and suicidal ideation.      Objective:  BP 126/84 (BP Location: Left Arm, Patient Position: Sitting, Cuff Size: Normal)   Pulse (!) 103   Temp 98.4 F (36.9 C) (Oral)   Resp 15   Ht 5\' 4"  (1.626 m)   Wt 145 lb 12.8 oz (66.1 kg)   SpO2 98%   BMI 25.03 kg/m   BP Readings from Last 3 Encounters:  07/18/20 126/84  02/22/20 126/76  09/22/19 116/80    Wt Readings from Last 3 Encounters:  07/18/20 145 lb 12.8 oz (66.1 kg)  06/13/20 142 lb (64.4 kg)  02/22/20 147 lb 3.2 oz (66.8 kg)    General appearance: alert, cooperative and appears stated age Ears: not evaluated today Throat: lips, mucosa, and tongue normal; teeth and gums normal Neck: no adenopathy, no carotid bruit, supple, symmetrical, trachea midline and thyroid not enlarged, symmetric, no tenderness/mass/nodules Back: symmetric,  no curvature. ROM normal. No CVA tenderness. Lungs: clear to auscultation bilaterally Heart: regular rate and rhythm, S1, S2 normal, no murmur, click, rub or gallop Abdomen: soft, non-tender; bowel sounds normal; no masses,  no organomegaly Pulses: 2+ and symmetric Skin: Skin color, texture, turgor normal. No rashes or lesions MSK: bilateral trapezius muscle spasm.  Restricted  ROM w/r/t head turn due to pain  Lymph nodes: Cervical, supraclavicular, and axillary nodes normal.  Lab Results  Component Value Date   HGBA1C 5.5 03/29/2018   HGBA1C 5.6 03/12/2017   HGBA1C 5.4 02/11/2016    Lab Results  Component Value Date   CREATININE 0.85 02/20/2020   CREATININE 0.99 03/29/2018   CREATININE 0.85 03/12/2017    Lab Results  Component Value Date   WBC 6.6 02/20/2015   HGB 13.3 02/20/2015   HCT 39.8 02/20/2015   PLT 330 02/20/2015   GLUCOSE 86 02/20/2020   CHOL 125 02/20/2020   TRIG 57.0 02/20/2020   HDL 54.80 02/20/2020   LDLCALC 59 02/20/2020   ALT 12 02/20/2020   AST 19 02/20/2020   NA 138 02/20/2020   K 3.9 02/20/2020   CL 102 02/20/2020   CREATININE 0.85 02/20/2020   BUN 14 02/20/2020   CO2 30 02/20/2020   TSH 5.31 (H) 02/20/2020   HGBA1C 5.5 03/29/2018   MICROALBUR <0.7 02/11/2016    No results found.  Assessment & Plan:   Problem List Items Addressed This Visit      Unprioritized   Sciatica associated with disorder of lumbosacral spine   Relevant Medications   tiZANidine (ZANAFLEX) 4 MG tablet   Trapezius muscle spasm    Reviewed prior plain  Films done < 1 year ago.  Exam is negative for neurologic changes.  Continue tylenol and motrin for pain management. Adding zanaflex for spasm.  Daily Stretching exercises  Recommended.       Wears hearing aid      I have discontinued Cynthia Ruiz "[name]"'s doxycycline. I am also having her start on tiZANidine.  Meds ordered this encounter  Medications  . tiZANidine (ZANAFLEX) 4 MG tablet    Sig: Take 1 tablet (4 mg total) by mouth at bedtime.    Dispense:  30 tablet    Refill:  5    Medications Discontinued During This Encounter  Medication Reason  . doxycycline (VIBRA-TABS) 100 MG tablet     Follow-up: No follow-ups on file.   Sherlene Shams, MD

## 2020-07-20 DIAGNOSIS — M62838 Other muscle spasm: Secondary | ICD-10-CM | POA: Insufficient documentation

## 2020-07-20 DIAGNOSIS — Z974 Presence of external hearing-aid: Secondary | ICD-10-CM | POA: Insufficient documentation

## 2020-07-20 NOTE — Assessment & Plan Note (Signed)
Reviewed prior plain  Films done < 1 year ago.  Exam is negative for neurologic changes.  Continue tylenol and motrin for pain management. Adding zanaflex for spasm.  Daily Stretching exercises  Recommended.

## 2020-07-24 ENCOUNTER — Encounter: Payer: Self-pay | Admitting: Internal Medicine

## 2020-07-24 ENCOUNTER — Other Ambulatory Visit: Payer: Self-pay | Admitting: Internal Medicine

## 2020-07-24 DIAGNOSIS — G4700 Insomnia, unspecified: Secondary | ICD-10-CM | POA: Insufficient documentation

## 2020-07-24 MED ORDER — ESZOPICLONE 2 MG PO TABS: 2.0000 mg | ORAL_TABLET | Freq: Every evening | ORAL | 0 refills | Status: DC | PRN

## 2020-11-02 ENCOUNTER — Telehealth: Payer: BC Managed Care – PPO | Admitting: Nurse Practitioner

## 2020-11-02 DIAGNOSIS — B9789 Other viral agents as the cause of diseases classified elsewhere: Secondary | ICD-10-CM | POA: Diagnosis not present

## 2020-11-02 DIAGNOSIS — J329 Chronic sinusitis, unspecified: Secondary | ICD-10-CM | POA: Diagnosis not present

## 2020-11-02 MED ORDER — FLUTICASONE PROPIONATE 50 MCG/ACT NA SUSP: 2.0000 | Freq: Every day | NASAL | 6 refills | Status: DC

## 2020-11-02 NOTE — Progress Notes (Signed)
E-Visit for Sinus Problems ? ?We are sorry that you are not feeling well.  Here is how we plan to help! ? ?Based on what you have shared with me it looks like you have sinusitis.  Sinusitis is inflammation and infection in the sinus cavities of the head.  Based on your presentation I believe you most likely have Acute Viral Sinusitis.This is an infection most likely caused by a virus. There is not specific treatment for viral sinusitis other than to help you with the symptoms until the infection runs its course.  You may use an oral decongestant such as Mucinex D or if you have glaucoma or high blood pressure use plain Mucinex. Saline nasal spray help and can safely be used as often as needed for congestion, I have prescribed: Fluticasone nasal spray two sprays in each nostril once a day ? ?Some authorities believe that zinc sprays or the use of Echinacea may shorten the course of your symptoms. ? ?Sinus infections are not as easily transmitted as other respiratory infection, however we still recommend that you avoid close contact with loved ones, especially the very young and elderly.  Remember to wash your hands thoroughly throughout the day as this is the number one way to prevent the spread of infection! ? ?Home Care: ?Only take medications as instructed by your medical team. ?Do not take these medications with alcohol. ?A steam or ultrasonic humidifier can help congestion.  You can place a towel over your head and breathe in the steam from hot water coming from a faucet. ?Avoid close contacts especially the very young and the elderly. ?Cover your mouth when you cough or sneeze. ?Always remember to wash your hands. ? ?Get Help Right Away If: ?You develop worsening fever or sinus pain. ?You develop a severe head ache or visual changes. ?Your symptoms persist after you have completed your treatment plan. ? ?Make sure you ?Understand these instructions. ?Will watch your condition. ?Will get help right away if you  are not doing well or get worse. ? ? ?Thank you for choosing an e-visit. ? ?Your e-visit answers were reviewed by a board certified advanced clinical practitioner to complete your personal care plan. Depending upon the condition, your plan could have included both over the counter or prescription medications. ? ?Please review your pharmacy choice. Make sure the pharmacy is open so you can pick up prescription now. If there is a problem, you may contact your provider through MyChart messaging and have the prescription routed to another pharmacy.  Your safety is important to us. If you have drug allergies check your prescription carefully.  ? ?For the next 24 hours you can use MyChart to ask questions about today's visit, request a non-urgent call back, or ask for a work or school excuse. ?You will get an email in the next two days asking about your experience. I hope that your e-visit has been valuable and will speed your recovery. ? ?5-10 minutes spent reviewing and documenting in chart. ? ?

## 2020-11-05 ENCOUNTER — Emergency Department
Admission: EM | Admit: 2020-11-05 | Discharge: 2020-11-05 | Disposition: A | Discharge status: Home / Self Care | Payer: BC Managed Care – PPO | Attending: Emergency Medicine | Admitting: Emergency Medicine

## 2020-11-05 ENCOUNTER — Emergency Department: Payer: BC Managed Care – PPO

## 2020-11-05 ENCOUNTER — Encounter: Payer: Self-pay | Admitting: Emergency Medicine

## 2020-11-05 ENCOUNTER — Other Ambulatory Visit: Payer: Self-pay

## 2020-11-05 DIAGNOSIS — Z23 Encounter for immunization: Secondary | ICD-10-CM | POA: Insufficient documentation

## 2020-11-05 DIAGNOSIS — R52 Pain, unspecified: Secondary | ICD-10-CM | POA: Diagnosis not present

## 2020-11-05 DIAGNOSIS — M25562 Pain in left knee: Secondary | ICD-10-CM | POA: Diagnosis not present

## 2020-11-05 DIAGNOSIS — R0902 Hypoxemia: Secondary | ICD-10-CM | POA: Diagnosis not present

## 2020-11-05 DIAGNOSIS — S0993XA Unspecified injury of face, initial encounter: Secondary | ICD-10-CM | POA: Diagnosis not present

## 2020-11-05 DIAGNOSIS — S0081XA Abrasion of other part of head, initial encounter: Secondary | ICD-10-CM | POA: Diagnosis not present

## 2020-11-05 DIAGNOSIS — S0990XA Unspecified injury of head, initial encounter: Secondary | ICD-10-CM | POA: Diagnosis not present

## 2020-11-05 DIAGNOSIS — W01198A Fall on same level from slipping, tripping and stumbling with subsequent striking against other object, initial encounter: Secondary | ICD-10-CM | POA: Insufficient documentation

## 2020-11-05 DIAGNOSIS — S80211A Abrasion, right knee, initial encounter: Secondary | ICD-10-CM | POA: Insufficient documentation

## 2020-11-05 DIAGNOSIS — S8991XA Unspecified injury of right lower leg, initial encounter: Secondary | ICD-10-CM | POA: Diagnosis not present

## 2020-11-05 DIAGNOSIS — S40212A Abrasion of left shoulder, initial encounter: Secondary | ICD-10-CM | POA: Diagnosis not present

## 2020-11-05 DIAGNOSIS — W19XXXA Unspecified fall, initial encounter: Secondary | ICD-10-CM

## 2020-11-05 DIAGNOSIS — S4982XA Other specified injuries of left shoulder and upper arm, initial encounter: Secondary | ICD-10-CM | POA: Diagnosis not present

## 2020-11-05 DIAGNOSIS — I959 Hypotension, unspecified: Secondary | ICD-10-CM | POA: Diagnosis not present

## 2020-11-05 DIAGNOSIS — S42142A Displaced fracture of glenoid cavity of scapula, left shoulder, initial encounter for closed fracture: Secondary | ICD-10-CM | POA: Diagnosis not present

## 2020-11-05 MED ORDER — KETOROLAC TROMETHAMINE 30 MG/ML IJ SOLN
30.0000 mg | Freq: Once | INTRAMUSCULAR | Status: AC
  Administered 2020-11-05: 30 mg via INTRAMUSCULAR
  Filled 2020-11-05: qty 1

## 2020-11-05 MED ORDER — TETANUS-DIPHTH-ACELL PERTUSSIS 5-2.5-18.5 LF-MCG/0.5 IM SUSY
0.5000 mL | PREFILLED_SYRINGE | Freq: Once | INTRAMUSCULAR | Status: AC
  Administered 2020-11-05: 0.5 mL via INTRAMUSCULAR
  Filled 2020-11-05: qty 0.5

## 2020-11-05 MED ORDER — MUPIROCIN 2 % EX OINT
TOPICAL_OINTMENT | Freq: Once | CUTANEOUS | Status: DC
  Filled 2020-11-05: qty 22

## 2020-11-05 MED ORDER — IBUPROFEN 800 MG PO TABS: 800.0000 mg | ORAL_TABLET | Freq: Three times a day (TID) | ORAL | 0 refills | Status: DC | PRN

## 2020-11-05 MED ORDER — MUPIROCIN CALCIUM 2 % EX CREA
TOPICAL_CREAM | Freq: Once | CUTANEOUS | Status: DC
  Filled 2020-11-05: qty 15

## 2020-11-05 NOTE — ED Notes (Signed)
See triage note  Presents s/p fall  States she tripped   Landed on right knee and left shoulder     Abrasions note  With some bruising  No LOC

## 2020-11-05 NOTE — Discharge Instructions (Addendum)
You can take Ibuprofen 800s for pain.  Apply Mupirocin to abrasions twice daily.

## 2020-11-05 NOTE — ED Provider Notes (Signed)
[facility]-EMERGENCY DEPARTMENT  ____________________________________________  Time seen: Approximately 7:18 PM  I have reviewed the triage vital signs and the nursing notes.   HISTORY  Chief Complaint Fall   Historian Patient     HPI Cynthia Ruiz is a 55 y.o. female presents to the emergency department after patient reports that she had a mechanical fall while trying to open an umbrella.  Patient has abrasions to her face, left shoulder and right knee.  She is primarily complaining of pain to her nose and her left knee.  She denies chest pain, chest tightness or abdominal pain.  She has been able to ambulate since injury occurred.  Patient is requesting ibuprofen 800.  She cannot recall her last tetanus shot.  No other alleviating measures have been attempted.   Past Medical History:  Diagnosis Date   Anxiety      Immunizations up to date:  Yes.     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/25/2019   Coccygeal pain, chronic 02/23/2019   Encounter for screening laboratory testing for COVID-19 virus 01/29/2019   Constipation 01/29/2019   Right shoulder pain 07/01/2016   Anxiety as acute reaction to gross stress 02/12/2016   Encounter for screening colonoscopy 06/07/2015   Perimenopause 05/21/2015   Overweight 05/21/2015   Family history of colon cancer requiring screening colonoscopy 06/20/2014   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 Authorizing Provider  ibuprofen (ADVIL) 800 MG tablet Take 1 tablet (800 mg total) by mouth every 8 (eight) hours as needed. 11/05/20  Yes Pia Mau M, PA-C  eszopiclone (LUNESTA) 2 MG TABS tablet Take 1 tablet (2 mg total) by mouth at bedtime as needed for sleep. 07/24/20   Sherlene Shams, MD  fluticasone (FLONASE) 50 MCG/ACT nasal spray Place 2 sprays into both nostrils daily. 11/02/20   Daphine Deutscher, Mary-Margaret, FNP  tiZANidine (ZANAFLEX) 4 MG tablet Take 1 tablet (4 mg total) by mouth at bedtime. 07/18/20   Sherlene Shams, MD    Allergies Patient has no known allergies.  Family History  Problem Relation Age of Onset   Arthritis Mother 48       rheumatoid arthritis - died 65   Hypertension Father    Glaucoma Father    Cancer Father 62       multiple colon polyps,  not CA    Multiple sclerosis Sister 7   Diabetes Paternal Grandfather    Down syndrome Daughter    Cancer Maternal Grandmother        bile duct   Cancer Paternal Grandmother        unsure what type   Colon cancer Maternal Uncle    Breast cancer Neg Hx  Social History Social History   Tobacco Use   Smoking status: Never   Smokeless tobacco: Never  Vaping Use   Vaping Use: Never used  Substance Use Topics   Alcohol use: Yes    Comment: Occasionally    Drug use: No     Review of Systems  Constitutional: No fever/chills Eyes:  No discharge ENT: No upper respiratory complaints. Respiratory: no cough. No SOB/ use of accessory muscles to breath Gastrointestinal:   No nausea, no vomiting.  No diarrhea.  No constipation. Musculoskeletal: Negative for musculoskeletal pain. Skin: Patient has abrasions to left shoulder, right knee and face.  ____________________________________________   PHYSICAL  EXAM:  VITAL SIGNS: ED Triage Vitals [11/05/20 1630]  Enc Vitals Group     BP 117/74     Pulse Rate 92     Resp 20     Temp 98.5 F (36.9 C)     Temp Source Oral     SpO2 100 %     Weight 145 lb (65.8 kg)     Height 5\' 3"  (1.6 m)     Head Circumference      Peak Flow      Pain Score 8     Pain Loc      Pain Edu?      Excl. in GC?      Constitutional: Alert and oriented. Well appearing and in no acute distress. Eyes: Conjunctivae are normal. PERRL. EOMI. Head: Patient has swelling and ecchymosis overlying nose. ENT:      Nose: No epistaxis.      Mouth/Throat: Mucous membranes are moist.  Neck: No stridor.  No cervical spine tenderness to palpation. Cardiovascular: Normal rate, regular rhythm. Normal S1 and S2.  Good peripheral circulation. Respiratory: Normal respiratory effort without tachypnea or retractions. Lungs CTAB. Good air entry to the bases with no decreased or absent breath sounds Gastrointestinal: Bowel sounds x 4 quadrants. Soft and nontender to palpation. No guarding or rigidity. No distention. Musculoskeletal: Patient performs limited range of motion at the left elbow. Neurologic:  Normal for age. No gross focal neurologic deficits are appreciated.  Skin: Patient has abrasions of right knee, left shoulder and face. Psychiatric: Mood and affect are normal for age. Speech and behavior are normal.   ____________________________________________   LABS (all labs ordered are listed, but only abnormal results are displayed)  Labs Reviewed - No data to display ____________________________________________  EKG   ____________________________________________  RADIOLOGY , personally viewed and evaluated these images (plain radiographs) as part of my medical decision making, as well as reviewing the written report by the radiologist.  CT Head Wo Contrast  Result Date: 11/05/2020 CLINICAL DATA:  Facial trauma EXAM: CT HEAD WITHOUT CONTRAST CT  MAXILLOFACIAL WITHOUT CONTRAST CT CERVICAL SPINE WITHOUT CONTRAST TECHNIQUE: Multidetector CT imaging of the head, cervical spine, and maxillofacial structures were performed using the standard protocol without intravenous contrast. Multiplanar CT image reconstructions of the cervical spine and maxillofacial structures were also generated. COMPARISON:  None. FINDINGS: CT HEAD FINDINGS Brain: No evidence of acute infarction, hemorrhage, hydrocephalus, extra-axial collection or mass lesion/mass effect. Vascular: No hyperdense vessel or unexpected calcification. Skull: Normal. Negative for fracture or focal lesion. Other: None. CT MAXILLOFACIAL FINDINGS Osseous: No fracture or mandibular dislocation. No destructive process. Orbits: Negative. No traumatic or inflammatory finding. Sinuses: Minimal paranasal sinus mucosal thickening. Soft tissues: Negative. CT CERVICAL SPINE FINDINGS Alignment: Normal. Skull base and vertebrae: No acute fracture. No primary bone lesion or focal  pathologic process. Soft tissues and spinal canal: No prevertebral fluid or swelling. No visible canal hematoma. Disc levels:  Minimal multilevel degenerative disc changes. Upper chest: Negative. Other: None IMPRESSION: No acute intracranial abnormality.  No acute facial fracture. No acute cervical spine fracture. Electronically Signed   By: Caprice RenshawJacob  Kahn   On: 11/05/2020 17:21   CT Cervical Spine Wo Contrast  Result Date: 11/05/2020 CLINICAL DATA:  Facial trauma EXAM: CT HEAD WITHOUT CONTRAST CT MAXILLOFACIAL WITHOUT CONTRAST CT CERVICAL SPINE WITHOUT CONTRAST TECHNIQUE: Multidetector CT imaging of the head, cervical spine, and maxillofacial structures were performed using the standard protocol without intravenous contrast. Multiplanar CT image reconstructions of the cervical spine and maxillofacial structures were also generated. COMPARISON:  None. FINDINGS: CT HEAD FINDINGS Brain: No evidence of acute infarction, hemorrhage, hydrocephalus,  extra-axial collection or mass lesion/mass effect. Vascular: No hyperdense vessel or unexpected calcification. Skull: Normal. Negative for fracture or focal lesion. Other: None. CT MAXILLOFACIAL FINDINGS Osseous: No fracture or mandibular dislocation. No destructive process. Orbits: Negative. No traumatic or inflammatory finding. Sinuses: Minimal paranasal sinus mucosal thickening. Soft tissues: Negative. CT CERVICAL SPINE FINDINGS Alignment: Normal. Skull base and vertebrae: No acute fracture. No primary bone lesion or focal pathologic process. Soft tissues and spinal canal: No prevertebral fluid or swelling. No visible canal hematoma. Disc levels:  Minimal multilevel degenerative disc changes. Upper chest: Negative. Other: None IMPRESSION: No acute intracranial abnormality.  No acute facial fracture. No acute cervical spine fracture. Electronically Signed   By: Caprice RenshawJacob  Kahn   On: 11/05/2020 17:21   DG Shoulder Left  Result Date: 11/05/2020 CLINICAL DATA:  55 year old female with trauma to the left shoulder. EXAM: LEFT SHOULDER - 2+ VIEW COMPARISON:  None. FINDINGS: There is a small minimally displaced fracture of the posterior bony glenoid. No other acute fracture. Partially visualized apparent periosteal elevation of the humeral diaphysis, likely related to a subacute or old fracture. Clinical correlation is recommended. Dedicated radiograph of the left humerus may provide better evaluation. The bones are mildly osteopenic. There is no dislocation. The soft tissues are unremarkable. IMPRESSION: Small minimally displaced fracture of the posterior bony glenoid. No dislocation. Electronically Signed   By: Elgie CollardArash  Radparvar M.D.   On: 11/05/2020 17:50   DG Knee Complete 4 Views Right  Result Date: 11/05/2020 CLINICAL DATA:  55 year old female with trauma to the right knee. EXAM: RIGHT KNEE - COMPLETE 4+ VIEW COMPARISON:  None. FINDINGS: No evidence of fracture, dislocation, or joint effusion. No evidence of  arthropathy or other focal bone abnormality. Soft tissues are unremarkable. IMPRESSION: Negative. Electronically Signed   By: Elgie CollardArash  Radparvar M.D.   On: 11/05/2020 17:47   CT MAXILLOFACIAL WO CONTRAST  Result Date: 11/05/2020 CLINICAL DATA:  Facial trauma EXAM: CT HEAD WITHOUT CONTRAST CT MAXILLOFACIAL WITHOUT CONTRAST CT CERVICAL SPINE WITHOUT CONTRAST TECHNIQUE: Multidetector CT imaging of the head, cervical spine, and maxillofacial structures were performed using the standard protocol without intravenous contrast. Multiplanar CT image reconstructions of the cervical spine and maxillofacial structures were also generated. COMPARISON:  None. FINDINGS: CT HEAD FINDINGS Brain: No evidence of acute infarction, hemorrhage, hydrocephalus, extra-axial collection or mass lesion/mass effect. Vascular: No hyperdense vessel or unexpected calcification. Skull: Normal. Negative for fracture or focal lesion. Other: None. CT MAXILLOFACIAL FINDINGS Osseous: No fracture or mandibular dislocation. No destructive process. Orbits: Negative. No traumatic or inflammatory finding. Sinuses: Minimal paranasal sinus mucosal thickening. Soft tissues: Negative. CT CERVICAL SPINE FINDINGS Alignment: Normal. Skull base and vertebrae: No acute fracture. No primary bone  lesion or focal pathologic process. Soft tissues and spinal canal: No prevertebral fluid or swelling. No visible canal hematoma. Disc levels:  Minimal multilevel degenerative disc changes. Upper chest: Negative. Other: None IMPRESSION: No acute intracranial abnormality.  No acute facial fracture. No acute cervical spine fracture. Electronically Signed   By: Caprice Renshaw   On: 11/05/2020 17:21    ____________________________________________    PROCEDURES  Procedure(s) performed:     Procedures     Medications  ketorolac (TORADOL) 30 MG/ML injection 30 mg (has no administration in time range)  Tdap (BOOSTRIX) injection 0.5 mL (has no administration in time  range)  mupirocin cream (BACTROBAN) 2 % (has no administration in time range)     ____________________________________________   INITIAL IMPRESSION / ASSESSMENT AND PLAN / ED COURSE  Pertinent labs & imaging results that were available during my care of the patient were reviewed by me and considered in my medical decision making (see chart for details).      Assessment and plan Fall 55 year old female presents to the emergency department after she had a mechanical, nonsyncopal fall.  Vital signs are reassuring at triage.  On physical exam, patient was alert, active and nontoxic-appearing.  Patient had a nondisplaced glenoid fracture on x-ray of the left shoulder.  X-rays of the right knee showed no bony abnormality.  No facial fracture, intracranial bleed, skull fracture or C-spine fracture on dedicated CTs.  Patient declined prescriptions for Norco stating that she would rather take an anti-inflammatory.  She was given an injection of Toradol in the emergency department and discharged with ibuprofen 800.  Her tetanus status was updated in the emergency department.  All patient questions were answered.     ____________________________________________  FINAL CLINICAL IMPRESSION(S) / ED DIAGNOSES  Final diagnoses:  Fall, initial encounter      NEW MEDICATIONS STARTED DURING THIS VISIT:  ED Discharge Orders          Ordered    ibuprofen (ADVIL) 800 MG tablet  Every 8 hours PRN        11/05/20 1902                This chart was dictated using voice recognition software/Dragon. Despite best efforts to proofread, errors can occur which can change the meaning. Any change was purely unintentional.     Gasper Lloyd 11/05/20 Marrianne Mood, MD 11/06/20 1009

## 2020-11-05 NOTE — ED Triage Notes (Signed)
Pt comes into the ED vai EMS from [place], states she tripped and fell on the concrete, denies LOC, has abrasions to the face, denies neck or back pain.Cynthia Ruiz

## 2020-11-05 NOTE — ED Triage Notes (Signed)
Pt via EMS from home. Pt c/o of mechanical fall today and fell on the concrete. Denies LOC. Pt c/o R knee and L shoulder pain. Pt does have abrasion her knee and face also with bruising. Pt is A&Ox4 and NAD.

## 2020-11-07 DIAGNOSIS — S42145A Nondisplaced fracture of glenoid cavity of scapula, left shoulder, initial encounter for closed fracture: Secondary | ICD-10-CM | POA: Diagnosis not present

## 2020-11-13 DIAGNOSIS — H02815 Retained foreign body in left lower eyelid: Secondary | ICD-10-CM | POA: Diagnosis not present

## 2020-11-13 DIAGNOSIS — H2513 Age-related nuclear cataract, bilateral: Secondary | ICD-10-CM | POA: Diagnosis not present

## 2020-11-14 DIAGNOSIS — J342 Deviated nasal septum: Secondary | ICD-10-CM | POA: Diagnosis not present

## 2020-11-28 DIAGNOSIS — S42145A Nondisplaced fracture of glenoid cavity of scapula, left shoulder, initial encounter for closed fracture: Secondary | ICD-10-CM | POA: Diagnosis not present

## 2020-11-29 DIAGNOSIS — M25612 Stiffness of left shoulder, not elsewhere classified: Secondary | ICD-10-CM | POA: Diagnosis not present

## 2020-11-29 DIAGNOSIS — M25512 Pain in left shoulder: Secondary | ICD-10-CM | POA: Diagnosis not present

## 2020-12-12 DIAGNOSIS — M25512 Pain in left shoulder: Secondary | ICD-10-CM | POA: Diagnosis not present

## 2020-12-12 DIAGNOSIS — M25612 Stiffness of left shoulder, not elsewhere classified: Secondary | ICD-10-CM | POA: Diagnosis not present

## 2020-12-18 ENCOUNTER — Other Ambulatory Visit: Payer: Self-pay | Admitting: Internal Medicine

## 2020-12-18 DIAGNOSIS — Z1231 Encounter for screening mammogram for malignant neoplasm of breast: Secondary | ICD-10-CM

## 2020-12-19 DIAGNOSIS — M25512 Pain in left shoulder: Secondary | ICD-10-CM | POA: Diagnosis not present

## 2020-12-19 DIAGNOSIS — M25612 Stiffness of left shoulder, not elsewhere classified: Secondary | ICD-10-CM | POA: Diagnosis not present

## 2020-12-26 DIAGNOSIS — M25612 Stiffness of left shoulder, not elsewhere classified: Secondary | ICD-10-CM | POA: Diagnosis not present

## 2020-12-26 DIAGNOSIS — M5412 Radiculopathy, cervical region: Secondary | ICD-10-CM | POA: Diagnosis not present

## 2020-12-26 DIAGNOSIS — S42255A Nondisplaced fracture of greater tuberosity of left humerus, initial encounter for closed fracture: Secondary | ICD-10-CM | POA: Diagnosis not present

## 2020-12-26 DIAGNOSIS — M25512 Pain in left shoulder: Secondary | ICD-10-CM | POA: Diagnosis not present

## 2021-01-02 DIAGNOSIS — M25612 Stiffness of left shoulder, not elsewhere classified: Secondary | ICD-10-CM | POA: Diagnosis not present

## 2021-01-02 DIAGNOSIS — M25512 Pain in left shoulder: Secondary | ICD-10-CM | POA: Diagnosis not present

## 2021-01-04 ENCOUNTER — Ambulatory Visit
Admission: RE | Admit: 2021-01-04 | Discharge: 2021-01-04 | Disposition: A | Discharge status: Home / Self Care | Payer: BC Managed Care – PPO | Source: Ambulatory Visit | Attending: Internal Medicine | Admitting: Internal Medicine

## 2021-01-04 ENCOUNTER — Other Ambulatory Visit: Payer: Self-pay

## 2021-01-04 DIAGNOSIS — Z1231 Encounter for screening mammogram for malignant neoplasm of breast: Secondary | ICD-10-CM | POA: Diagnosis not present

## 2021-01-09 DIAGNOSIS — M5412 Radiculopathy, cervical region: Secondary | ICD-10-CM | POA: Diagnosis not present

## 2021-01-16 DIAGNOSIS — M5412 Radiculopathy, cervical region: Secondary | ICD-10-CM | POA: Diagnosis not present

## 2021-01-30 ENCOUNTER — Encounter: Payer: Self-pay | Admitting: Internal Medicine

## 2021-01-30 ENCOUNTER — Ambulatory Visit (INDEPENDENT_AMBULATORY_CARE_PROVIDER_SITE_OTHER): Payer: BC Managed Care – PPO | Admitting: Internal Medicine

## 2021-01-30 ENCOUNTER — Other Ambulatory Visit: Payer: Self-pay

## 2021-01-30 VITALS — BP 132/90 | HR 87 | Temp 96.9°F | Ht 63.0 in | Wt 150.8 lb

## 2021-01-30 DIAGNOSIS — E785 Hyperlipidemia, unspecified: Secondary | ICD-10-CM | POA: Insufficient documentation

## 2021-01-30 DIAGNOSIS — F430 Acute stress reaction: Secondary | ICD-10-CM

## 2021-01-30 DIAGNOSIS — Z23 Encounter for immunization: Secondary | ICD-10-CM | POA: Diagnosis not present

## 2021-01-30 DIAGNOSIS — E663 Overweight: Secondary | ICD-10-CM | POA: Diagnosis not present

## 2021-01-30 DIAGNOSIS — F411 Generalized anxiety disorder: Secondary | ICD-10-CM

## 2021-01-30 DIAGNOSIS — E1169 Type 2 diabetes mellitus with other specified complication: Secondary | ICD-10-CM

## 2021-01-30 DIAGNOSIS — M25512 Pain in left shoulder: Secondary | ICD-10-CM

## 2021-01-30 DIAGNOSIS — G8929 Other chronic pain: Secondary | ICD-10-CM

## 2021-01-30 DIAGNOSIS — R946 Abnormal results of thyroid function studies: Secondary | ICD-10-CM | POA: Diagnosis not present

## 2021-01-30 DIAGNOSIS — E038 Other specified hypothyroidism: Secondary | ICD-10-CM

## 2021-01-30 MED ORDER — TIZANIDINE HCL 4 MG PO TABS: 4.0000 mg | ORAL_TABLET | Freq: Four times a day (QID) | ORAL | 2 refills | Status: DC | PRN

## 2021-01-30 NOTE — Patient Instructions (Addendum)
Use the muscle relaxer (tizanidine).  The maximal dose is 6 mg  , minimum is 2 mg  Start  taking 1000 mg tylenol at bedtime even if you are not hurting at bedtime  Rechecking thyroid   You need to advocate for some free time  FOR YOURSELF.  AIM FOR 30 MINUTES DAILY

## 2021-01-30 NOTE — Progress Notes (Signed)
Subjective:  Patient ID: Cynthia Ruiz, female    DOB: 25-Oct-1965  Age: 55 y.o. MRN: [id number]  CC: The primary encounter diagnosis was Hyperlipidemia LDL goal <160. Diagnoses of Abnormal thyroid function test, Need for immunization against influenza, Overweight, Chronic left shoulder pain, Anxiety as acute reaction to gross stress, and Subclinical hypothyroidism were also pertinent to this visit.  HPI Cynthia Ruiz presents for  Chief Complaint  Patient presents with   Follow-up    Follow up     1) History of mechanical fall occurred in July when she tripped over a high curb while walking in [place] with daughter [name] .    Hit forehead and left eyebrown zygomatic arch  and nose.  Apparently had a brief  LOC.  Left eye  had a ruptured blood vessel.   Treated in ER  and released without any attention to her wounds. CT scans of head and cervical spine were negative for fractures.  Knee was x rayed.  had multiple abrasions of face and shoulder that WERE NOT CLEANED! .  Given .tetanus BOOSTER and toradol.   fracture has healed.  Refused narcotics.  Requested ibuprofen  2) posterior glenoid fracture:  wore a sling  for 6 weeks.  Meloxicam x 2 weeks prescribed by [facility].  Having muscle spasms not taking tizanidine . Ortho has ordered PT to rehab her fractured humerus.   3) insomnia:  s/p trial of  lunesta.  Waking up early .  Notes that she sleeps better when physically exhausted  so she prefers to avoid medications.   4) Caregiver fatigue; [name] is now receiving  CAP C.  She is choosing "Self directed care "  Getting help from son's girlfriend. ("Sometimes I just need a break")   5  Outpatient Medications Prior to Visit  Medication Sig Dispense Refill   fluticasone (FLONASE) 50 MCG/ACT nasal spray Place 2 sprays into both nostrils daily. 16 g 6   ibuprofen (ADVIL) 800 MG tablet Take 1 tablet (800 mg total) by mouth every 8 (eight) hours as needed. 30 tablet 0   eszopiclone (LUNESTA) 2 MG  TABS tablet Take 1 tablet (2 mg total) by mouth at bedtime as needed for sleep. 30 tablet 0   tiZANidine (ZANAFLEX) 4 MG tablet Take 1 tablet (4 mg total) by mouth at bedtime. 30 tablet 5   No facility-administered medications prior to visit.    Review of Systems;  Patient denies headache, fevers, malaise, unintentional weight loss, skin rash, eye pain, sinus congestion and sinus pain, sore throat, dysphagia,  hemoptysis , cough, dyspnea, wheezing, chest pain, palpitations, orthopnea, edema, abdominal pain, nausea, melena, diarrhea, constipation, flank pain, dysuria, hematuria, urinary  Frequency, nocturia, numbness, tingling, seizures,  Focal weakness, Loss of consciousness,  Tremor, , depresson, anxiety, and suicidal ideation.      Objective:  BP 132/90 (BP Location: Right Arm, Patient Position: Sitting, Cuff Size: Normal)   Pulse 87   Temp (!) 96.9 F (36.1 C) (Temporal)   Ht 5\' 3"  (1.6 m)   Wt 150 lb 12.8 oz (68.4 kg)   SpO2 99%   BMI 26.71 kg/m   BP Readings from Last 3 Encounters:  01/30/21 132/90  11/05/20 120/63  07/18/20 126/84    Wt Readings from Last 3 Encounters:  01/30/21 150 lb 12.8 oz (68.4 kg)  11/05/20 145 lb (65.8 kg)  07/18/20 145 lb 12.8 oz (66.1 kg)    General appearance: alert, cooperative and appears stated age Ears: normal TM's  and external ear canals both ears Throat: lips, mucosa, and tongue normal; teeth and gums normal Neck: no adenopathy, no carotid bruit, supple, symmetrical, trachea midline and thyroid not enlarged, symmetric, no tenderness/mass/nodules Back: symmetric, no curvature. ROM normal. No CVA tenderness. Lungs: clear to auscultation bilaterally Heart: regular rate and rhythm, S1, S2 normal, no murmur, click, rub or gallop Abdomen: soft, non-tender; bowel sounds normal; no masses,  no organomegaly Pulses: 2+ and symmetric Skin: Skin color, texture, turgor normal. No rashes or lesions Lymph nodes: Cervical, supraclavicular, and  axillary nodes normal. Psych: affect normal, makes good eye contact. No fidgeting,  Smiles easily.  Denies suicidal thoughts    Lab Results  Component Value Date   HGBA1C 5.5 03/29/2018   HGBA1C 5.6 03/12/2017   HGBA1C 5.4 02/11/2016    Lab Results  Component Value Date   CREATININE 0.85 02/20/2020   CREATININE 0.99 03/29/2018   CREATININE 0.85 03/12/2017    Lab Results  Component Value Date   WBC 6.6 02/20/2015   HGB 13.3 02/20/2015   HCT 39.8 02/20/2015   PLT 330 02/20/2015   GLUCOSE 86 02/20/2020   CHOL 125 02/20/2020   TRIG 57.0 02/20/2020   HDL 54.80 02/20/2020   LDLCALC 59 02/20/2020   ALT 12 02/20/2020   AST 19 02/20/2020   NA 138 02/20/2020   K 3.9 02/20/2020   CL 102 02/20/2020   CREATININE 0.85 02/20/2020   BUN 14 02/20/2020   CO2 30 02/20/2020   TSH 5.01 (H) 01/30/2021   HGBA1C 5.5 03/29/2018   MICROALBUR <0.7 02/11/2016    MM 3D SCREEN BREAST BILATERAL  Result Date: 01/11/2021 CLINICAL DATA:  Screening. EXAM: DIGITAL SCREENING BILATERAL MAMMOGRAM WITH TOMOSYNTHESIS AND CAD TECHNIQUE: Bilateral screening digital craniocaudal and mediolateral oblique mammograms were obtained. Bilateral screening digital breast tomosynthesis was performed. The images were evaluated with computer-aided detection. COMPARISON:  Previous exam(s). ACR Breast Density Category b: There are scattered areas of fibroglandular density. FINDINGS: There are no findings suspicious for malignancy. IMPRESSION: No mammographic evidence of malignancy. A result letter of this screening mammogram will be mailed directly to the patient. RECOMMENDATION: Screening mammogram in one year. (Code:SM-B-01Y) BI-RADS CATEGORY  1: Negative. Electronically Signed   By: Emmaline Kluver M.D.   On: 01/11/2021 16:08   Assessment & Plan:   Problem List Items Addressed This Visit     Overweight    I have addressed  BMI and recommended a low glycemic index diet utilizing smaller more frequent meals to increase  metabolism.  I have also recommended that patient start exercising with a goal of 30 minutes of aerobic exercise a minimum of 5 days per week.       Anxiety as acute reaction to gross stress    She did not tolerate zoloft due to nausea .  Symptoms of ANXIETY have improved with increased outside assistance in caring for [name]       Hyperlipidemia LDL goal <160 - Primary    10 yr cardiac risk is < 5%.  No treatment indicated  Lab Results  Component Value Date   CHOL 125 02/20/2020   HDL 54.80 02/20/2020   LDLCALC 59 02/20/2020   TRIG 57.0 02/20/2020   CHOLHDL 2 02/20/2020         Left shoulder pain    Using ibuprofen and tylenol.  PT to start per Orthopedics       Subclinical hypothyroidism    TSH is minimally elevated ;  and she has multiple symptoms of  hypothyroidism but has reasons for all them that are unrelated to hypothyroid ,  . Will give her the option of treatment vs repeat test in 6 months   Lab Results  Component Value Date   TSH 5.01 (H) 01/30/2021         Other Visit Diagnoses     Abnormal thyroid function test       Relevant Orders   Thyroid Panel With TSH (Completed)   Need for immunization against influenza       Relevant Orders   Flu Vaccine QUAD 23mo+IM (Fluarix, Fluzone & Alfiuria Quad PF) (Completed)       I spent 30 minutes dedicated to the care of this patient on the date of this encounter to include pre-visit review of her medical history,  most recent imaging studies, Face-to-face time with the patient , and post visit ordering of testing and therapeutics.   Meds ordered this encounter  Medications   tiZANidine (ZANAFLEX) 4 MG tablet    Sig: Take 1 tablet (4 mg total) by mouth every 6 (six) hours as needed for muscle spasms.    Dispense:  60 tablet    Refill:  2    Medications Discontinued During This Encounter  Medication Reason   eszopiclone (LUNESTA) 2 MG TABS tablet    tiZANidine (ZANAFLEX) 4 MG tablet     Follow-up: No  follow-ups on file.   Sherlene Shams, MD

## 2021-01-31 LAB — THYROID PANEL WITH TSH
Free Thyroxine Index: 2.1 (ref 1.4–3.8)
T3 Uptake: 27 % (ref 22–35)
T4, Total: 7.8 ug/dL (ref 5.1–11.9)
TSH: 5.01 mIU/L — ABNORMAL HIGH (ref 0.40–4.50)

## 2021-02-02 DIAGNOSIS — M25512 Pain in left shoulder: Secondary | ICD-10-CM | POA: Insufficient documentation

## 2021-02-02 DIAGNOSIS — E038 Other specified hypothyroidism: Secondary | ICD-10-CM | POA: Insufficient documentation

## 2021-02-02 NOTE — Assessment & Plan Note (Signed)
10 yr cardiac risk is < 5%.  No treatment indicated  Lab Results  Component Value Date   CHOL 125 02/20/2020   HDL 54.80 02/20/2020   LDLCALC 59 02/20/2020   TRIG 57.0 02/20/2020   CHOLHDL 2 02/20/2020

## 2021-02-02 NOTE — Assessment & Plan Note (Signed)
I have addressed  BMI and recommended a low glycemic index diet utilizing smaller more frequent meals to increase metabolism.  I have also recommended that patient start exercising with a goal of 30 minutes of aerobic exercise a minimum of 5 days per week.  

## 2021-02-02 NOTE — Assessment & Plan Note (Signed)
Using ibuprofen and tylenol.  PT to start per Orthopedics

## 2021-02-02 NOTE — Assessment & Plan Note (Addendum)
She did not tolerate zoloft due to nausea .  Symptoms of ANXIETY have improved with increased outside assistance in caring for [facility]

## 2021-02-02 NOTE — Assessment & Plan Note (Addendum)
TSH is minimally elevated ;  and she has multiple symptoms of hypothyroidism but has reasons for all them that are unrelated to hypothyroid ,  . Will give her the option of treatment vs repeat test in 6 months   Lab Results  Component Value Date   TSH 5.01 (H) 01/30/2021

## 2021-05-10 ENCOUNTER — Encounter: Payer: Self-pay | Admitting: Internal Medicine

## 2021-05-10 NOTE — Telephone Encounter (Signed)
Spoke with pt and she stated that for the past two days she has seen a streak of bright red blood in her stool and when wiping. She also stated that she even sees a little bit of red blood on tissue even if she doesn't have a bowel movement. Pt has been scheduled with Sharyn Lull, NP Monday morning.

## 2021-05-13 ENCOUNTER — Encounter: Payer: Self-pay | Admitting: Adult Health

## 2021-05-13 ENCOUNTER — Ambulatory Visit (INDEPENDENT_AMBULATORY_CARE_PROVIDER_SITE_OTHER): Payer: BC Managed Care – PPO | Admitting: Adult Health

## 2021-05-13 ENCOUNTER — Other Ambulatory Visit: Payer: Self-pay

## 2021-05-13 ENCOUNTER — Telehealth: Payer: Self-pay

## 2021-05-13 VITALS — BP 138/86 | HR 100 | Temp 98.1°F | Ht 63.0 in | Wt 154.2 lb

## 2021-05-13 DIAGNOSIS — K5909 Other constipation: Secondary | ICD-10-CM

## 2021-05-13 DIAGNOSIS — K219 Gastro-esophageal reflux disease without esophagitis: Secondary | ICD-10-CM | POA: Diagnosis not present

## 2021-05-13 DIAGNOSIS — K648 Other hemorrhoids: Secondary | ICD-10-CM | POA: Insufficient documentation

## 2021-05-13 DIAGNOSIS — Z800 Family history of malignant neoplasm of digestive organs: Secondary | ICD-10-CM

## 2021-05-13 DIAGNOSIS — K625 Hemorrhage of anus and rectum: Secondary | ICD-10-CM | POA: Diagnosis not present

## 2021-05-13 LAB — COMPREHENSIVE METABOLIC PANEL
ALT: 13 U/L (ref 0–35)
AST: 19 U/L (ref 0–37)
Albumin: 4.5 g/dL (ref 3.5–5.2)
Alkaline Phosphatase: 62 U/L (ref 39–117)
BUN: 19 mg/dL (ref 6–23)
CO2: 27 mEq/L (ref 19–32)
Calcium: 9.3 mg/dL (ref 8.4–10.5)
Chloride: 104 mEq/L (ref 96–112)
Creatinine, Ser: 0.91 mg/dL (ref 0.40–1.20)
GFR: 70.07 mL/min (ref >60.00)
Glucose, Bld: 92 mg/dL (ref 70–99)
Potassium: 4.2 mEq/L (ref 3.5–5.1)
Sodium: 140 mEq/L (ref 135–145)
Total Bilirubin: 0.4 mg/dL (ref 0.2–1.2)
Total Protein: 7.5 g/dL (ref 6.0–8.3)

## 2021-05-13 LAB — CBC WITH DIFFERENTIAL/PLATELET
Basophils Absolute: 0 10*3/uL (ref 0.0–0.1)
Basophils Relative: 0.7 % (ref 0.0–3.0)
Eosinophils Absolute: 0 10*3/uL (ref 0.0–0.7)
Eosinophils Relative: 0.9 % (ref 0.0–5.0)
HCT: 37.1 % (ref 36.0–46.0)
Hemoglobin: 12.3 g/dL (ref 12.0–15.0)
Lymphocytes Relative: 22 % (ref 12.0–46.0)
Lymphs Abs: 1.1 10*3/uL (ref 0.7–4.0)
MCHC: 33.1 g/dL (ref 30.0–36.0)
MCV: 88.3 fl (ref 78.0–100.0)
Monocytes Absolute: 0.5 10*3/uL (ref 0.1–1.0)
Monocytes Relative: 9.7 % (ref 3.0–12.0)
Neutro Abs: 3.2 10*3/uL (ref 1.4–7.7)
Neutrophils Relative %: 66.7 % (ref 43.0–77.0)
Platelets: 272 10*3/uL (ref 150.0–400.0)
RBC: 4.21 Mil/uL (ref 3.87–5.11)
RDW: 13.1 % (ref 11.5–15.5)
WBC: 4.8 10*3/uL (ref 4.0–10.5)

## 2021-05-13 MED ORDER — OMEPRAZOLE 20 MG PO CPDR: 20.0000 mg | DELAYED_RELEASE_CAPSULE | Freq: Every day | ORAL | 3 refills | Status: DC

## 2021-05-13 MED ORDER — HYDROCORTISONE ACETATE 25 MG RE SUPP: 25.0000 mg | Freq: Two times a day (BID) | RECTAL | 0 refills | Status: DC

## 2021-05-13 NOTE — Assessment & Plan Note (Signed)
Trail of Anusol. Blood seems to be in toilet water and mixed on stool. Internal hemorrhoid was palpated at 6 'oclock.

## 2021-05-13 NOTE — Patient Instructions (Signed)
Rectal Bleeding Rectal bleeding is when blood comes out of the opening of the butt (anus). People with this kind of bleeding may notice bright red blood in their underwear or in the toilet after they poop (have a bowel movement). They may also have blood mixed with their poop (stool), or dark red or black poop. Rectal bleeding is often a sign that something is wrong. This condition can be caused by many things. It needs to be checked by a doctor. Your doctor will do tests to know what is causing your condition. Follow these instructions at home: Watch for any changes in your condition. Take these actions to help with bleeding and discomfort: Medicines Take over-the-counter and prescription medicines only as told by your doctor. Ask your doctor about changing or stopping your normal medicines. This is important if you are taking blood thinners. Medicines that thin the blood can make rectal bleeding worse. Managing constipation Your condition may cause trouble pooping (constipation). To prevent or treat trouble pooping, or to help make your poop soft, you may need to: Drink enough fluid to keep your pee (urine) pale yellow. Take over-the-counter or prescription medicines. Eat foods that are high in fiber. These include beans, whole grains, and fresh fruits and vegetables. Limit foods that are high in fat and sugar. These include fried or sweet foods.  General instructions Try not to strain when you poop. Try taking a warm bath. This may help with pain. Keep all follow-up visits as told by your doctor. This is important. Contact a doctor if: You have pain or swelling in your belly (abdomen). You have a fever. You feel weak. You feel like you may vomit. You cannot poop. Get help right away if: You have new bleeding. You have more bleeding than before. You have black or dark red poop. You vomit blood or something that looks like coffee grounds. You pass out (faint). You have very bad pain  in your butt. Summary Rectal bleeding is when blood comes out of the opening of the butt. This bleeding is often a sign that something is wrong. Eat a diet that is high in fiber. This will help to keep your poop soft. Talk to your doctor if you take medicines that thin the blood. These medicines can make bleeding worse. Get help right away if you have new or more bleeding, black or dark red poop, or blood in your vomit. Also, get help if you pass out or have very bad pain in your butt. This information is not intended to replace advice given to you by your health care provider. Make sure you discuss any questions you have with your health care provider. Document Revised: 03/02/2019 Document Reviewed: 03/02/2019 Elsevier Patient Education  2022 Elsevier Inc. Psyllium granules or powder for solution What is this medication? PSYLLIUM (SIL i yum) is a bulk-forming fiber laxative. This medicine is used to treat constipation. Increasing fiber in the diet may also help lower cholesterol and promote heart health for some people. This medicine may be used for other purposes; ask your health care provider or pharmacist if you have questions. COMMON BRAND NAME(S): Fiber Therapy, GenFiber, Geri-Mucil, Hydrocil, Konsyl, Metamucil, Metamucil MultiHealth, Mucilin, Natural Fiber Therapy, Reguloid What should I tell my care team before I take this medication? They need to know if you have any of these conditions: blockage in your bowel difficulty swallowing inflammatory bowel disease phenylketonuria stomach or intestine problems sudden change in bowel habits lasting more than 2 weeks an unusual  or allergic reaction to psyllium, other medicines, dyes, or preservatives pregnant or trying or get pregnant breast-feeding How should I use this medication? Mix this medicine into a full glass (240 mL) of water or other cool drink. Take this medicine by mouth. Follow the directions on the package labeling, or take  as directed by your health care professional. Take your medicine at regular intervals. Do not take your medicine more often than directed. Talk to your pediatrician regarding the use of this medicine in children. While this drug may be prescribed for children as young as 29 years old for selected conditions, precautions do apply. Overdosage: If you think you have taken too much of this medicine contact a poison control center or emergency room at once. NOTE: This medicine is only for you. Do not share this medicine with others. What if I miss a dose? If you miss a dose, take it as soon as you can. If it is almost time for your next dose, take only that dose. Do not take double or extra doses. What may interact with this medication? Interactions are not expected. Take this product at least 2 hours before or after other medicines. This list may not describe all possible interactions. Give your health care provider a list of all the medicines, herbs, non-prescription drugs, or dietary supplements you use. Also tell them if you smoke, drink alcohol, or use illegal drugs. Some items may interact with your medicine. What should I watch for while using this medication? Check with your doctor or health care professional if your symptoms do not start to get better or if they get worse. Stop using this medicine and contact your doctor or health care professional if you have rectal bleeding or if you have to treat your constipation for more than 1 week. These could be signs of a more serious condition. Drink several glasses of water a day while you are taking this medicine. This will help to relieve constipation and prevent dehydration. What side effects may I notice from receiving this medication? Side effects that you should report to your doctor or health care professional as soon as possible: allergic reactions like skin rash, itching or hives, swelling of the face, lips, or tongue breathing problems chest  pain nausea, vomiting rectal bleeding trouble swallowing Side effects that usually do not require medical attention (report to your doctor or health care professional if they continue or are bothersome): bloating gas stomach cramps This list may not describe all possible side effects. Call your doctor for medical advice about side effects. You may report side effects to FDA at 1-800-FDA-1088. Where should I keep my medication? Keep out of the reach of children. Store at room temperature between 15 and 30 degrees C (59 and 86 degrees F). Protect from moisture. Throw away any unused medicine after the expiration date. NOTE: This sheet is a summary. It may not cover all possible information. If you have questions about this medicine, talk to your doctor, pharmacist, or health care provider.  2022 Elsevier/Gold Standard (2017-08-25 00:00:00) Constipation, Adult Constipation is when a person has trouble pooping (having a bowel movement). When you have this condition, you may poop fewer than 3 times a week. Your poop (stool) may also be dry, hard, or bigger than normal. Follow these instructions at home: Eating and drinking  Eat foods that have a lot of fiber, such as: Fresh fruits and vegetables. Whole grains. Beans. Eat less of foods that are low in fiber and high  in fat and sugar, such as: [place] fries. Hamburgers. Cookies. Candy. Soda. Drink enough fluid to keep your pee (urine) pale yellow. General instructions Exercise regularly or as told by your doctor. Try to do 150 minutes of exercise each week. Go to the restroom when you feel like you need to poop. Do not hold it in. Take over-the-counter and prescription medicines only as told by your doctor. These include any fiber supplements. When you poop: Do deep breathing while relaxing your lower belly (abdomen). Relax your pelvic floor. The pelvic floor is a group of muscles that support the rectum, bladder, and intestines (as well  as the uterus in women). Watch your condition for any changes. Tell your doctor if you notice any. Keep all follow-up visits as told by your doctor. This is important. Contact a doctor if: You have pain that gets worse. You have a fever. You have not pooped for 4 days. You vomit. You are not hungry. You lose weight. You are bleeding from the opening of the butt (anus). You have thin, pencil-like poop. Get help right away if: You have a fever, and your symptoms suddenly get worse. You leak poop or have blood in your poop. Your belly feels hard or bigger than normal (bloated). You have very bad belly pain. You feel dizzy or you faint. Summary Constipation is when a person poops fewer than 3 times a week, has trouble pooping, or has poop that is dry, hard, or bigger than normal. Eat foods that have a lot of fiber. Drink enough fluid to keep your pee (urine) pale yellow. Take over-the-counter and prescription medicines only as told by your doctor. These include any fiber supplements. This information is not intended to replace advice given to you by your health care provider. Make sure you discuss any questions you have with your health care provider. Document Revised: 02/16/2019 Document Reviewed: 02/16/2019 Elsevier Patient Education  2022 [employer]

## 2021-05-13 NOTE — Assessment & Plan Note (Signed)
Constipation, increase fiber try psyllium husk and or metamucil with full glass 8 oz of liquid. Increase fluids and fiber in diet.

## 2021-05-13 NOTE — Assessment & Plan Note (Signed)
Start prilosec 20mg  once a day. Starting 05/13/21.

## 2021-05-13 NOTE — Assessment & Plan Note (Signed)
Referral to gastroenterology. She will call if not heard within 2 weeks for appointment.

## 2021-05-13 NOTE — Progress Notes (Signed)
Cbc and cmp within normal limits.

## 2021-05-13 NOTE — Assessment & Plan Note (Signed)
referral back to Dr. Allegra Lai [facility] for possible gastroenterology work up endoscopy and possible repeat colonoscopy.

## 2021-05-13 NOTE — Telephone Encounter (Signed)
Scheduled for 06/25/2021

## 2021-05-13 NOTE — Progress Notes (Signed)
Acute Office Visit  Subjective:    Patient ID: Cynthia AuerbachDeborah M Ruiz, female    DOB: Jan 28, 1966, 56 y.o.   MRN: [id number]  Chief Complaint  Patient presents with   Blood In Stools    Onset of this past Thursday. No pain with bowel movement or wiping. Notices blood when wiping and in the water.    Constipation    Onset of this past Saturday with no blood in stool with reduced bowel movements     Rectal Bleeding  The current episode started 5 to 7 days ago. Pertinent negatives include no abdominal pain, no diarrhea, no nausea, no rectal pain and no vomiting.  Patient is in today for blood in stool since last Thursday. Reports she has had stomach issues all of  her life, she was going to have endoscopy and then covid hit and she did not have endoscopy.  History of hemorrhoids, but this time had more blood this time.  She has uncle just diagnosed with colonoscopy. No blood Saturday but today and Sunday it was in the toilet water and in the tissue  She has been constipated.  She did take Prilosec once Thursday night as she felt acidic in her upper stomach.denies any trauma.   Patient  denies any fever, body aches,chills, rash, chest pain, shortness of breath, nausea, vomiting, or diarrhea.    Past Medical History:  Diagnosis Date   Anxiety     Past Surgical History:  Procedure Laterality Date   CESAREAN SECTION  1991, 2002, 2007   COLONOSCOPY WITH PROPOFOL N/A 07/18/2015   Procedure: COLONOSCOPY WITH PROPOFOL;  Surgeon: Earline MayotteJeffrey W Byrnett, MD;  Location[facility];  Service: Endoscopy;  Laterality: N/A;   KNEE ARTHROSCOPY Right 1991   Pyloric Stenosis surgery     TUBAL LIGATION  2007    Family History  Problem Relation Age of Onset   Arthritis Mother 58       rheumatoid arthritis - died 58   Hypertension Father    Glaucoma Father    Cancer Father 58       multiple colon polyps,  not CA    Multiple sclerosis Sister 58   Diabetes Paternal Grandfather    Down syndrome Daughter     Cancer Maternal Grandmother        bile duct   Cancer Paternal Grandmother        unsure what type   Colon cancer Maternal Uncle    Breast cancer Neg Hx     Social History   Socioeconomic History   Marital status: Married    Spouse name: Marjo BickerDavid Ruiz   Number of children: 3   Years of education: 17   Highest education level: Not on file  Occupational History   Occupation: Part time substitute teacher    Comment: [employer]  Tobacco Use   Smoking status: Never   Smokeless tobacco: Never  Vaping Use   Vaping Use: Never used  Substance and Sexual Activity   Alcohol use: Yes    Comment: Occasionally    Drug use: No   Sexual activity: Yes    Partners: Male    Birth control/protection: Surgical  Other Topics Concern   Not on file  Social History Narrative   Cynthia PoundDeborah grew up in [place]. She attended [employer] in [place] and obtained her Bachelors degree in Sociology. She then attended [employer] in [place] and obtained her teaching certification in elementary education. She currently  lives in [place] with her husband, Cynthia Ruiz, and 2 of their 3 children. She has 1 son who is in the [employer] stationed in [place]. 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 Her main focus is her children. She enjoys going out with her husband and socializing with friends. She also does a lot of volunteer work in [employer]. 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   Social Determinants of Health   Financial Resource Strain: Not on file  Food Insecurity: Not on file  Transportation Needs: Not on file  Physical Activity: Not on file  Stress: Not on file  Social Connections: Not on file  Intimate Partner Violence: Not on file    Outpatient Medications Prior to Visit  Medication Sig Dispense Refill   Multiple Vitamins-Minerals (MULTIVITAMIN WOMEN PO) Take by mouth daily.     fluticasone  (FLONASE) 50 MCG/ACT nasal spray Place 2 sprays into both nostrils daily. (Patient not taking: Reported on 05/13/2021) 16 g 6   ibuprofen (ADVIL) 800 MG tablet Take 1 tablet (800 mg total) by mouth every 8 (eight) hours as needed. (Patient not taking: Reported on 05/13/2021) 30 tablet 0   tiZANidine (ZANAFLEX) 4 MG tablet Take 1 tablet (4 mg total) by mouth every 6 (six) hours as needed for muscle spasms. (Patient not taking: Reported on 05/13/2021) 60 tablet 2   No facility-administered medications prior to visit.    No Known Allergies  Review of Systems  Constitutional: Negative.   Respiratory: Negative.    Cardiovascular: Negative.   Gastrointestinal:  Positive for anal bleeding, blood in stool, constipation and hematochezia. Negative for abdominal distention, abdominal pain, diarrhea, nausea, rectal pain and vomiting.       Gerd   Genitourinary: Negative.   Musculoskeletal: Negative.   Skin: Negative.   Neurological: Negative.   Psychiatric/Behavioral: Negative.        Objective:    Physical Exam Constitutional:      Appearance: She is normal weight. She is not ill-appearing, toxic-appearing or diaphoretic.  HENT:     Head: Normocephalic and atraumatic.     Right Ear: External ear normal.     Left Ear: External ear normal.     Nose: Nose normal.     Mouth/Throat:     Mouth: Mucous membranes are moist.  Eyes:     Conjunctiva/sclera: Conjunctivae normal.  Cardiovascular:     Rate and Rhythm: Normal rate and regular rhythm.  Pulmonary:     Effort: Pulmonary effort is normal.     Breath sounds: Normal breath sounds.  Abdominal:     General: There is no distension.     Palpations: Abdomen is soft. There is no mass.     Tenderness: There is no abdominal tenderness. There is no right CVA tenderness, left CVA tenderness, guarding or rebound.     Hernia: No hernia is present.  Genitourinary:    Rectum: Normal.       Comments: Stool in rectum.  Musculoskeletal:         General: Normal range of motion.     Cervical back: Normal range of motion and neck supple. No tenderness.  Lymphadenopathy:     Cervical: No cervical adenopathy.  Neurological:     Mental Status: She is alert and oriented to person, place, and time.  Psychiatric:        Mood and Affect: Mood normal.        Behavior: Behavior normal.  Thought Content: Thought content normal.        Judgment: Judgment normal.    BP 138/86 (BP Location: Left Arm, Patient Position: Sitting, Cuff Size: Normal)    Pulse 100    Temp 98.1 F (36.7 C) (Oral)    Ht 5\' 3"  (1.6 m)    Wt 154 lb 3.2 oz (69.9 kg)    SpO2 99%    BMI 27.32 kg/m  Wt Readings from Last 3 Encounters:  05/13/21 154 lb 3.2 oz (69.9 kg)  01/30/21 150 lb 12.8 oz (68.4 kg)  11/05/20 145 lb (65.8 kg)    Health Maintenance Due  Topic Date Due   HIV Screening  Never done   Hepatitis C Screening  Never done   Zoster Vaccines- Shingrix (1 of 2) Never done    There are no preventive care reminders to display for this patient.   Lab Results  Component Value Date   TSH 5.01 (H) 01/30/2021   Lab Results  Component Value Date   WBC 6.6 02/20/2015   HGB 13.3 02/20/2015   HCT 39.8 02/20/2015   MCV 90 02/20/2015   PLT 330 02/20/2015   Lab Results  Component Value Date   NA 138 02/20/2020   K 3.9 02/20/2020   CO2 30 02/20/2020   GLUCOSE 86 02/20/2020   BUN 14 02/20/2020   CREATININE 0.85 02/20/2020   BILITOT 0.6 02/20/2020   ALKPHOS 80 02/20/2020   AST 19 02/20/2020   ALT 12 02/20/2020   PROT 7.0 02/20/2020   ALBUMIN 4.3 02/20/2020   CALCIUM 9.3 02/20/2020   GFR 76.70 02/20/2020   Lab Results  Component Value Date   CHOL 125 02/20/2020   Lab Results  Component Value Date   HDL 54.80 02/20/2020   Lab Results  Component Value Date   LDLCALC 59 02/20/2020   Lab Results  Component Value Date   TRIG 57.0 02/20/2020   Lab Results  Component Value Date   CHOLHDL 2 02/20/2020   Lab Results  Component Value  Date   HGBA1C 5.5 03/29/2018       Assessment & Plan:   Problem List Items Addressed This Visit       Cardiovascular and Mediastinum   Internal hemorrhoid - Primary    [place]. Blood seems to be in toilet water and mixed on stool. Internal hemorrhoid was palpated at 6 'oclock.       Relevant Medications   hydrocortisone (ANUSOL-HC) 25 MG suppository   Other Relevant Orders   CBC with Differential/Platelet   Comprehensive metabolic panel   Ambulatory referral to Gastroenterology   Fecal occult blood, imunochemical     Digestive   Bright red rectal bleeding    referral back to [facility] for possible gastroenterology work up endoscopy and possible repeat colonoscopy.       Relevant Orders   CBC with Differential/Platelet   Comprehensive metabolic panel   Ambulatory referral to Gastroenterology   Fecal occult blood, imunochemical   Gastroesophageal reflux disease    Start prilosec 20mg  once a day. Starting 05/13/21.      Relevant Medications   omeprazole (PRILOSEC) 20 MG capsule   Other Relevant Orders   Ambulatory referral to Gastroenterology     Other   Family history of colon cancer requiring screening colonoscopy    Referral to gastroenterology. She will call if not heard within 2 weeks for appointment.        Constipation    Constipation, increase fiber  try psyllium husk and or metamucil with full glass 8 oz of liquid. Increase fluids and fiber in diet.       Relevant Orders   Ambulatory referral to Gastroenterology   Fecal occult blood, imunochemical  Discussed constipation and ways to prevent.    Meds ordered this encounter  Medications   omeprazole (PRILOSEC) 20 MG capsule    Sig: Take 1 capsule (20 mg total) by mouth daily.    Dispense:  30 capsule    Refill:  3   hydrocortisone (ANUSOL-HC) 25 MG suppository    Sig: Place 1 suppository (25 mg total) rectally 2 (two) times daily.    Dispense:  12 suppository    Refill:  0      Advised patient call the office or your primary care doctor for an appointment if no improvement within 72 hours or if any symptoms change or worsen at any time  Advised ER or urgent Care if after hours or on weekend. Call 911 for emergency symptoms at any time.Patinet verbalized understanding of all instructions given/reviewed and treatment plan and has no further questions or concerns at this time.     Return in about 1 month (around 06/11/2021), or if symptoms worsen or fail to improve, for at any time for any worsening symptoms, Go to Emergency room/ urgent care if worse.   Jairo Ben, FNP

## 2021-05-15 ENCOUNTER — Other Ambulatory Visit: Payer: Self-pay | Admitting: Adult Health

## 2021-05-15 ENCOUNTER — Other Ambulatory Visit (INDEPENDENT_AMBULATORY_CARE_PROVIDER_SITE_OTHER): Payer: BC Managed Care – PPO

## 2021-05-15 ENCOUNTER — Telehealth: Payer: Self-pay | Admitting: Adult Health

## 2021-05-15 ENCOUNTER — Encounter: Payer: Self-pay | Admitting: Adult Health

## 2021-05-15 DIAGNOSIS — K648 Other hemorrhoids: Secondary | ICD-10-CM

## 2021-05-15 DIAGNOSIS — K625 Hemorrhage of anus and rectum: Secondary | ICD-10-CM | POA: Diagnosis not present

## 2021-05-15 DIAGNOSIS — K5909 Other constipation: Secondary | ICD-10-CM | POA: Diagnosis not present

## 2021-05-15 LAB — FECAL OCCULT BLOOD, IMMUNOCHEMICAL: Fecal Occult Bld: POSITIVE — AB

## 2021-05-15 MED ORDER — HYDROCORT-PRAMOXINE (PERIANAL) 1-1 % EX FOAM: 1.0000 | Freq: Two times a day (BID) | CUTANEOUS | 0 refills | Status: DC

## 2021-05-15 NOTE — Telephone Encounter (Signed)
CRITICAL VALUE STICKER  CRITICAL VALUE: Positive I FOB  RECEIVER (on-site recipient of call): Noreene Larsson  DATE & TIME NOTIFIED: 05/15/2021 11:16am  MESSENGER (representative from lab): Clydie Braun  MD NOTIFIED: NP Fl[place]  TIME OF NOTIFICATION:11:15am  RESPONSE:

## 2021-05-15 NOTE — Progress Notes (Signed)
Cynthia Ruiz see if we can have her come in earlier , I have opening for colonoscopy on 05/28/2021

## 2021-05-15 NOTE — Progress Notes (Signed)
Positive blood in stool on test. She is scheduled to see gastroenterology on 06/25/21 she did have small suspected internal hemorrhoid on exam, likely bleeding from this, will cc gastroenterology to see if she can be on cancellation list or see provider sooner. CBC is within normal.  If any symptoms worsening please schedule a follow up with the office .

## 2021-05-23 ENCOUNTER — Ambulatory Visit (INDEPENDENT_AMBULATORY_CARE_PROVIDER_SITE_OTHER): Payer: BC Managed Care – PPO | Admitting: Gastroenterology

## 2021-05-23 ENCOUNTER — Encounter: Payer: Self-pay | Admitting: Gastroenterology

## 2021-05-23 ENCOUNTER — Other Ambulatory Visit: Payer: Self-pay

## 2021-05-23 VITALS — BP 150/83 | HR 101 | Temp 98.3°F | Ht 63.5 in | Wt 154.0 lb

## 2021-05-23 DIAGNOSIS — K5900 Constipation, unspecified: Secondary | ICD-10-CM | POA: Diagnosis not present

## 2021-05-23 DIAGNOSIS — R195 Other fecal abnormalities: Secondary | ICD-10-CM | POA: Diagnosis not present

## 2021-05-23 NOTE — Progress Notes (Signed)
Jonathon Bellows MD, MRCP(U.K) [address]  [place]  [place], [place] [address]  Main: [phone]  Fax: [phone]   Gastroenterology Consultation  Referring Provider:     Sharmon Leyden* Primary Care Physician:  Crecencio Mc, MD Primary Gastroenterologist:  Dr. Jonathon Bellows  Reason for Consultation:     Rectal bleeding        HPI:   Cynthia Ruiz is a 56 y.o. y/o female referred for consultation & management  by Dr. Crecencio Mc, MD.    Previously been seen by Dr Bonna Gains back in 04/2018 for abdominal pain. Was having multiple bowel movements. Colonoscopy back in 2017 was normal.  Today she has been referred for rectal bleeding and issues with hemorrhoids. Stool occult was tested and was positive for blood.   05/13/2021: Hb 12.3 , CMP. Normal . TSH elevated in 01/2021  She states that she has had issues with constipation for many years.  She has tried to change her diet but it has not worked.  Not tried any prescription medications to help with the bowel movements.  Occasionally pops with very hard stools.  A few weeks back had 100 episodes of painless rectal bleeding with blood noticed on the tissue paper.  Has not recurred since.  No other change in shape of the stool.  Also has a history of occasional heartburn 1-2 times a month.  No history of dysphagia or weight loss.  She has a family member just diagnosed with colon cancer.   Past Medical History:  Diagnosis Date   Anxiety     Past Surgical History:  Procedure Laterality Date   [place], 2002, 2007   COLONOSCOPY WITH PROPOFOL N/A 07/18/2015   Procedure: COLONOSCOPY WITH PROPOFOL;  Surgeon: Robert Bellow, MD;  [facility] END[facility]COPY;  Service: Endoscopy;  Laterality: N/A;   KNEE ARTHROSCOPY Right 1991   Pyloric Stenosis surgery     TUBAL LIGATION  2007    Prior to Admission medications   Medication Sig Start Date End Date Taking? Authorizing Provider  fluticasone (FLONASE) 50  MCG/ACT nasal spray Place 2 sprays into both nostrils daily. 11/02/20  Yes Hassell Done, Mary-Margaret, FNP  Multiple Vitamins-Minerals (MULTIVITAMIN WOMEN PO) Take by mouth daily.   Yes [provider]  hydrocortisone-pramoxine (PROCTOFOAM-HC) rectal foam Place 1 applicator rectally 2 (two) times daily. Patient not taking: Reported on 05/23/2021 05/15/21   Flinchum, Kelby Aline, FNP  ibuprofen (ADVIL) 800 MG tablet Take 1 tablet (800 mg total) by mouth every 8 (eight) hours as needed. Patient not taking: Reported on 05/13/2021 11/05/20   Lannie Fields, PA-C  omeprazole (PRILOSEC) 20 MG capsule Take 1 capsule (20 mg total) by mouth daily. Patient not taking: Reported on 05/23/2021 05/13/21   Flinchum, Kelby Aline, FNP  tiZANidine (ZANAFLEX) 4 MG tablet Take 1 tablet (4 mg total) by mouth every 6 (six) hours as needed for muscle spasms. Patient not taking: Reported on 05/13/2021 01/30/21   Crecencio Mc, MD    Family History  Problem Relation Age of Onset   Arthritis Mother 41       rheumatoid arthritis - died 14   Hypertension Father    Glaucoma Father    Cancer Father 28       multiple colon polyps,  not CA    Multiple sclerosis Sister 44   Diabetes Paternal Grandfather    Down syndrome Daughter    Cancer Maternal Grandmother  bile duct   Cancer Paternal Grandmother        unsure what type   Colon cancer Maternal Uncle    Breast cancer Neg Hx      Social History   Tobacco Use   Smoking status: Never   Smokeless tobacco: Never  Vaping Use   Vaping Use: Never used  Substance Use Topics   Alcohol use: Yes    Comment: Occasionally    Drug use: No    Allergies as of 05/23/2021   (No Known Allergies)    Review of Systems:    All systems reviewed and negative except where noted in HPI.   Physical Exam:  BP (!) 150/83    Pulse (!) 101    Temp 98.3 F (36.8 C) (Oral)    Ht 5' 3.5" (1.613 m)    Wt 154 lb (69.9 kg)    BMI 26.85 kg/m  No LMP recorded. (Menstrual status:  Perimenopausal). Psych:  Alert and cooperative. Normal mood and affect. General:   Alert,  Well-developed, well-nourished, pleasant and cooperative in NAD Head:  Normocephalic and atraumatic. Eyes:  Sclera clear, no icterus.   Conjunctiva pink. Ears:  Normal auditory acuity. Nose:  No deformity, discharge, or lesions. Neurologic:  Alert and oriented x3;  grossly normal neurologically. Psych:  Alert and cooperative. Normal mood and affect.  Imaging Studies: No results found.  Assessment and Plan:   Cynthia Ruiz is a 56 y.o. y/o female has been referred for 1 or 2 episodes of painless rectal bleeding associated with passage of hard stool which has since not recurred.  Occasional history of heartburn.  No red flag signs.  Plan 1.  Colonoscopy next week 2.  Discussed about conservative management of constipation with high-fiber diet with a target of 25 g of fiber per day.  Patient information has been provided.  Will commence on Linzess 145 mcg/day.  She will call back for prescription or if it does not work for a different dosage 3.  Discussed about conservative management of acid reflux symptoms are very occasional and hence getting managed with occasional use of Tums instead less than 2 episodes per month  I have discussed alternative options, risks & benefits,  which include, but are not limited to, bleeding, infection, perforation,respiratory complication & drug reaction.  The patient agrees with this plan & written consent will be obtained.     Follow up in 4 weeks video visit to titrate medications for constipation   Dr Jonathon Bellows MD,MRCP(U.K)

## 2021-05-26 ENCOUNTER — Encounter: Payer: Self-pay | Admitting: Gastroenterology

## 2021-05-27 ENCOUNTER — Other Ambulatory Visit: Payer: Self-pay

## 2021-05-27 MED ORDER — PEG 3350-KCL-NA BICARB-NACL 420 G PO SOLR: ORAL | 0 refills | Status: DC

## 2021-05-29 ENCOUNTER — Encounter: Payer: Self-pay | Admitting: Gastroenterology

## 2021-05-29 ENCOUNTER — Other Ambulatory Visit: Payer: Self-pay

## 2021-05-29 MED ORDER — LINACLOTIDE 145 MCG PO CAPS: 145.0000 ug | ORAL_CAPSULE | Freq: Every day | ORAL | 2 refills | Status: DC

## 2021-05-31 ENCOUNTER — Ambulatory Visit: Payer: BC Managed Care – PPO | Admitting: Internal Medicine

## 2021-06-09 ENCOUNTER — Encounter: Payer: Self-pay | Admitting: Gastroenterology

## 2021-06-17 ENCOUNTER — Encounter: Payer: Self-pay | Admitting: Gastroenterology

## 2021-06-19 ENCOUNTER — Telehealth: Payer: Self-pay

## 2021-06-19 ENCOUNTER — Encounter: Payer: Self-pay | Admitting: Gastroenterology

## 2021-06-19 ENCOUNTER — Telehealth: Payer: Self-pay | Admitting: Gastroenterology

## 2021-06-19 NOTE — Telephone Encounter (Signed)
Rescheduled patient she is sick so rescheduled for 08/09/2021 called endo sent new refferal to [facility] and sent new communications out  ?

## 2021-06-19 NOTE — Telephone Encounter (Signed)
Pt was concerned she has nasal congestion cough and runny nose can she still have her colonoscopy on 3/14 ?

## 2021-06-20 ENCOUNTER — Telehealth: Payer: BC Managed Care – PPO | Admitting: Gastroenterology

## 2021-06-25 ENCOUNTER — Ambulatory Visit: Payer: BC Managed Care – PPO | Admitting: Gastroenterology

## 2021-07-23 ENCOUNTER — Encounter: Payer: Self-pay | Admitting: Internal Medicine

## 2021-07-24 ENCOUNTER — Other Ambulatory Visit: Payer: Self-pay | Admitting: Internal Medicine

## 2021-07-24 DIAGNOSIS — Z800 Family history of malignant neoplasm of digestive organs: Secondary | ICD-10-CM

## 2021-08-02 ENCOUNTER — Ambulatory Visit: Payer: BC Managed Care – PPO | Admitting: Internal Medicine

## 2021-08-02 ENCOUNTER — Encounter: Payer: Self-pay | Admitting: Internal Medicine

## 2021-08-02 VITALS — BP 136/84 | HR 84 | Temp 98.0°F | Ht 63.5 in | Wt 155.6 lb

## 2021-08-02 DIAGNOSIS — R0683 Snoring: Secondary | ICD-10-CM

## 2021-08-02 DIAGNOSIS — G4700 Insomnia, unspecified: Secondary | ICD-10-CM

## 2021-08-02 DIAGNOSIS — I471 Supraventricular tachycardia: Secondary | ICD-10-CM | POA: Diagnosis not present

## 2021-08-02 DIAGNOSIS — R0681 Apnea, not elsewhere classified: Secondary | ICD-10-CM | POA: Insufficient documentation

## 2021-08-02 DIAGNOSIS — F411 Generalized anxiety disorder: Secondary | ICD-10-CM

## 2021-08-02 DIAGNOSIS — F430 Acute stress reaction: Secondary | ICD-10-CM

## 2021-08-02 LAB — BASIC METABOLIC PANEL
BUN: 19 mg/dL (ref 6–23)
CO2: 26 mEq/L (ref 19–32)
Calcium: 9.1 mg/dL (ref 8.4–10.5)
Chloride: 104 mEq/L (ref 96–112)
Creatinine, Ser: 0.81 mg/dL (ref 0.40–1.20)
GFR: 80.44 mL/min (ref >60.00)
Glucose, Bld: 95 mg/dL (ref 70–99)
Potassium: 3.9 mEq/L (ref 3.5–5.1)
Sodium: 137 mEq/L (ref 135–145)

## 2021-08-02 LAB — TSH: TSH: 3.5 u[IU]/mL (ref 0.35–5.50)

## 2021-08-02 LAB — MAGNESIUM: Magnesium: 2.2 mg/dL (ref 1.5–2.5)

## 2021-08-02 MED ORDER — PAROXETINE HCL 10 MG PO TABS: 5.0000 mg | ORAL_TABLET | Freq: Every day | ORAL | 0 refills | Status: DC

## 2021-08-02 NOTE — Assessment & Plan Note (Addendum)
Starting paxil at 5 mg dose .   I  provided  30 minutes of  face-to-face time  during this encounter reviewing patient's current emotional issues, working  up her symptoms and providing counselling regarding her frustrating marital relationship ?

## 2021-08-02 NOTE — Patient Instructions (Addendum)
I RECOMMEND THE FOLLOWING: ? ?Assign your husband 50% of the responsibilities.  Make a chart.  Save yourself the emotional task of explaining (and repeating) ? ?Schedule your 30 minute walk or (or dance/exercise ) DAILY  with or  without Apolonio Schneiders ? ?Trial of generic paxil.  Starting dose 5 mg (1/2 tablet)  with breakfast or other meal, whatever has the most carbs  ? ?Home sleep study has been ordered  ?

## 2021-08-02 NOTE — Progress Notes (Signed)
? ?Subjective:  ?Patient ID: Cynthia Ruiz, female    DOB: 05-07-1965  Age: 56 y.o. MRN: [id number] ? ?CC: The primary encounter diagnosis was Paroxysmal supraventricular tachycardia (HCC). Diagnoses of Anxiety as acute reaction to gross stress, Witnessed apneic spells, Snoring, and Insomnia, unspecified type were also pertinent to this visit. ? ? ?This visit occurred during the SARS-CoV-2 public health emergency.  Safety protocols were in place, including screening questions prior to the visit, additional usage of staff PPE, and extensive cleaning of exam room while observing appropriate contact time as indicated for disinfecting solutions.   ? ?HPI ?Cynthia Ruiz presents for   follow upon anxiety and palpitations ?Chief Complaint  ?Patient presents with  ? Stress  ? ? ?1) woke up in the middle of the night twice  in the past month with pounding heart,  subjectively rapid rate.  Symptoms lasted  a few minutes   Were not accompanied by nightmares,  headaches,  chest pain shortness of breath.  Husband has noted  that she has apneic episoes when she is sleeping.  She does snore, has a deviated symptom. ? ?2) Anxiety : secondary to caregiver burden:  her daughter Cynthia Ruiz has Down's syndrome but is doing well but has a lot of medical follow up managed at [facility] with cardiac workup and endocrinology workup for menometrorrhagia.  Daughter  is becoming defiant, enters high school next year.  Husband works from home but does not assist in the mornings when daughter is misbehaving, and patient is often late to work because of the delay. She also supervises the care of her sister,  Cynthia Ruiz, who requires 24/7 assistance due to  advanced MS,  is 49 and running out of money due to cost of home caregivers.  Her anxiety is aggravated by frustration wit her husband;  she is not happy in marriage due to less contribution to caregiver load and household responsibilities by husband.   Patient finds herself getting extremely  aggravated frequently due to above stressors.  Gets red faced.  Felt firne when whe went to [facility] for a week by herself.  Reviewed prior pharmacotherapy trials   zoloft caused nausea  ? ?2) Insomnia  : taking nothing.  Wakes up at 4 am every morning , in bed by 10 pm.  All previous  medication trials not tolerated.  ? ?4) GI:  did not tolerate linzess due to stomach pain.  Colonoscopy next week  postponed several times  ? ?Outpatient Medications Prior to Visit  ?Medication Sig Dispense Refill  ? fluticasone (FLONASE) 50 MCG/ACT nasal spray Place 2 sprays into both nostrils daily. 16 g 6  ? Multiple Vitamins-Minerals (MULTIVITAMIN WOMEN PO) Take by mouth daily.    ? polyethylene glycol-electrolytes (NULYTELY) 420 g solution Prepare according to package instructions. Starting at 5:00 PM: Drink one 8 oz glass of mixture every 15 minutes until you finish half of the jug. Five hours prior to procedure, drink 8 oz glass of mixture every 15 minutes until it is all gone. Make sure you do not drink anything 4 hours prior to your procedure. (Patient not taking: Reported on 08/02/2021) 4000 mL 0  ? hydrocortisone-pramoxine (PROCTOFOAM-HC) rectal foam Place 1 applicator rectally 2 (two) times daily. (Patient not taking: Reported on 05/23/2021) 10 g 0  ? ibuprofen (ADVIL) 800 MG tablet Take 1 tablet (800 mg total) by mouth every 8 (eight) hours as needed. (Patient not taking: Reported on 05/13/2021) 30 tablet 0  ? linaclotide Karlene Einstein)  145 MCG CAPS capsule Take 1 capsule (145 mcg total) by mouth daily. 30 capsule 2  ? omeprazole (PRILOSEC) 20 MG capsule Take 1 capsule (20 mg total) by mouth daily. (Patient not taking: Reported on 05/23/2021) 30 capsule 3  ? tiZANidine (ZANAFLEX) 4 MG tablet Take 1 tablet (4 mg total) by mouth every 6 (six) hours as needed for muscle spasms. (Patient not taking: Reported on 05/13/2021) 60 tablet 2  ? ?No facility-administered medications prior to visit.  ? ? ?Review of Systems; ? ?Patient denies headache,  fevers, malaise, unintentional weight loss, skin rash, eye pain, sinus congestion and sinus pain, sore throat, dysphagia,  hemoptysis , cough, dyspnea, wheezing, chest pain, palpitations, orthopnea, edema, abdominal pain, nausea, melena, diarrhea, constipation, flank pain, dysuria, hematuria, urinary  Frequency, nocturia, numbness, tingling, seizures,  Focal weakness, Loss of consciousness,  Tremor, insomnia, depression, anxiety, and suicidal ideation.   ? ? ? ?Objective:  ?BP 136/84 (BP Location: Left Arm, Patient Position: Sitting, Cuff Size: Normal)   Pulse 84   Temp 98 ?F (36.7 ?C) (Oral)   Ht 5' 3.5" (1.613 m)   Wt 155 lb 9.6 oz (70.6 kg)   SpO2 98%   BMI 27.13 kg/m?  ? ?BP Readings from Last 3 Encounters:  ?08/02/21 136/84  ?05/23/21 (!) 150/83  ?05/13/21 138/86  ? ? ?Wt Readings from Last 3 Encounters:  ?08/02/21 155 lb 9.6 oz (70.6 kg)  ?05/23/21 154 lb (69.9 kg)  ?05/13/21 154 lb 3.2 oz (69.9 kg)  ? ? ?General appearance: alert, cooperative and appears stated age ?Ears: normal TM's and external ear canals both ears ?Throat: lips, mucosa, and tongue normal; teeth and gums normal ?Neck: no adenopathy, no carotid bruit, supple, symmetrical, trachea midline and thyroid not enlarged, symmetric, no tenderness/mass/nodules ?Back: symmetric, no curvature. ROM normal. No CVA tenderness. ?Lungs: clear to auscultation bilaterally ?Heart: regular rate and rhythm, S1, S2 normal, no murmur, click, rub or gallop ?Abdomen: soft, non-tender; bowel sounds normal; no masses,  no organomegaly ?Pulses: 2+ and symmetric ?Skin: Skin color, texture, turgor normal. No rashes or lesions ?Lymph nodes: Cervical, supraclavicular, and axillary nodes normal. ?Psych: affect  anxious, animated.  makes good eye contact. No fidgeting,  Smiles easily.  Denies suicidal thoughts .   ? ?Lab Results  ?Component Value Date  ? HGBA1C 5.5 03/29/2018  ? HGBA1C 5.6 03/12/2017  ? HGBA1C 5.4 02/11/2016  ? ? ?Lab Results  ?Component Value Date  ?  CREATININE 0.81 08/02/2021  ? CREATININE 0.91 05/13/2021  ? CREATININE 0.85 02/20/2020  ? ? ?Lab Results  ?Component Value Date  ? WBC 4.8 05/13/2021  ? HGB 12.3 05/13/2021  ? HCT 37.1 05/13/2021  ? PLT 272.0 05/13/2021  ? GLUCOSE 95 08/02/2021  ? CHOL 125 02/20/2020  ? TRIG 57.0 02/20/2020  ? HDL 54.80 02/20/2020  ? LDLCALC 59 02/20/2020  ? ALT 13 05/13/2021  ? AST 19 05/13/2021  ? NA 137 08/02/2021  ? K 3.9 08/02/2021  ? CL 104 08/02/2021  ? CREATININE 0.81 08/02/2021  ? BUN 19 08/02/2021  ? CO2 26 08/02/2021  ? TSH 3.50 08/02/2021  ? HGBA1C 5.5 03/29/2018  ? MICROALBUR <0.7 02/11/2016  ? ? ?MM 3D SCREEN BREAST BILATERAL ? ?Result Date: 01/11/2021 ?CLINICAL DATA:  Screening. EXAM: DIGITAL SCREENING BILATERAL MAMMOGRAM WITH TOMOSYNTHESIS AND CAD TECHNIQUE: Bilateral screening digital craniocaudal and mediolateral oblique mammograms were obtained. Bilateral screening digital breast tomosynthesis was performed. The images were evaluated with computer-aided detection. COMPARISON:  Previous exam(s). ACR Breast Density  Category b: There are scattered areas of fibroglandular density. FINDINGS: There are no findings suspicious for malignancy. IMPRESSION: No mammographic evidence of malignancy. A result letter of this screening mammogram will be mailed directly to the patient. RECOMMENDATION: Screening mammogram in one year. (Code:SM-B-01Y) BI-RADS CATEGORY  1: Negative. Electronically Signed   By: Emmaline KluverNancy  Ballantyne M.D.   On: 01/11/2021 16:08 ? ? ?Assessment & Plan:  ? ?Problem List Items Addressed This Visit   ? ? Anxiety as acute reaction to gross stress  ?  Starting paxil at 5 mg dose .   I  provided  30 minutes of  face-to-face time  during this encounter reviewing patient's current emotional issues, working  up her symptoms and providing counselling regarding her frustrating marital relationship ?  ?  ? Relevant Medications  ? PARoxetine (PAXIL) 10 MG tablet  ? Insomnia  ?  Multiple drug trials, all intolerant.   ? ?   ?  ? Paroxysmal supraventricular tachycardia (HCC) - Primary  ?  May be related to nocturnal hypoxia.  Lytes, thyrodi CBC normal. Caffeine intake reviewed and noncontributory ? ?Lab Results  ?Component Val

## 2021-08-02 NOTE — Assessment & Plan Note (Signed)
Multiple drug trials, all intolerant.   ?

## 2021-08-04 DIAGNOSIS — I471 Supraventricular tachycardia, unspecified: Secondary | ICD-10-CM | POA: Insufficient documentation

## 2021-08-04 NOTE — Assessment & Plan Note (Signed)
May be related to nocturnal hypoxia.  Lytes, thyrodi CBC normal. Caffeine intake reviewed and noncontributory ? ?Lab Results  ?Component Value Date  ? TSH 3.50 08/02/2021  ? ?Lab Results  ?Component Value Date  ? WBC 4.8 05/13/2021  ? HGB 12.3 05/13/2021  ? HCT 37.1 05/13/2021  ? MCV 88.3 05/13/2021  ? PLT 272.0 05/13/2021  ? ?

## 2021-08-04 NOTE — Assessment & Plan Note (Signed)
With nocturnal wakenings accompanied by palpitations/tachycardia . Sleep study ordered to rule out OSA ?

## 2021-08-05 ENCOUNTER — Other Ambulatory Visit: Payer: Self-pay

## 2021-08-05 ENCOUNTER — Encounter: Payer: Self-pay | Admitting: Gastroenterology

## 2021-08-06 ENCOUNTER — Ambulatory Visit: Payer: BC Managed Care – PPO | Admitting: Internal Medicine

## 2021-08-08 ENCOUNTER — Encounter: Payer: Self-pay | Admitting: Gastroenterology

## 2021-08-09 ENCOUNTER — Ambulatory Visit
Admission: RE | Admit: 2021-08-09 | Discharge: 2021-08-09 | Disposition: A | Discharge status: Home / Self Care | Payer: BC Managed Care – PPO | Attending: Gastroenterology | Admitting: Gastroenterology

## 2021-08-09 ENCOUNTER — Ambulatory Visit: Payer: BC Managed Care – PPO | Admitting: Anesthesiology

## 2021-08-09 ENCOUNTER — Encounter: Payer: Self-pay | Admitting: Gastroenterology

## 2021-08-09 ENCOUNTER — Encounter
Admission: RE | Disposition: A | Discharge status: Home / Self Care | Payer: Self-pay | Source: Home / Self Care | Attending: Gastroenterology

## 2021-08-09 DIAGNOSIS — K5900 Constipation, unspecified: Secondary | ICD-10-CM

## 2021-08-09 DIAGNOSIS — R195 Other fecal abnormalities: Secondary | ICD-10-CM | POA: Diagnosis not present

## 2021-08-09 DIAGNOSIS — K625 Hemorrhage of anus and rectum: Secondary | ICD-10-CM | POA: Insufficient documentation

## 2021-08-09 DIAGNOSIS — K649 Unspecified hemorrhoids: Secondary | ICD-10-CM | POA: Diagnosis not present

## 2021-08-09 DIAGNOSIS — K641 Second degree hemorrhoids: Secondary | ICD-10-CM | POA: Insufficient documentation

## 2021-08-09 DIAGNOSIS — F419 Anxiety disorder, unspecified: Secondary | ICD-10-CM | POA: Diagnosis not present

## 2021-08-09 HISTORY — PX: COLONOSCOPY WITH PROPOFOL: SHX5780

## 2021-08-09 SURGERY — COLONOSCOPY WITH PROPOFOL
Anesthesia: General

## 2021-08-09 MED ORDER — PROPOFOL 500 MG/50ML IV EMUL
INTRAVENOUS | Status: DC | PRN
  Administered 2021-08-09: 150 ug/kg/min via INTRAVENOUS

## 2021-08-09 MED ORDER — PROPOFOL 10 MG/ML IV BOLUS
INTRAVENOUS | Status: DC | PRN
  Administered 2021-08-09 (×2): 75 mg via INTRAVENOUS

## 2021-08-09 MED ORDER — SODIUM CHLORIDE 0.9 % IV SOLN: INTRAVENOUS | Status: DC

## 2021-08-09 MED ORDER — PROPOFOL 500 MG/50ML IV EMUL
INTRAVENOUS | Status: AC
  Filled 2021-08-09: qty 50

## 2021-08-09 MED ORDER — LIDOCAINE HCL (CARDIAC) PF 100 MG/5ML IV SOSY
PREFILLED_SYRINGE | INTRAVENOUS | Status: DC | PRN
  Administered 2021-08-09: 30 mg via INTRAVENOUS

## 2021-08-09 NOTE — Op Note (Signed)
[facility] ?Gastroenterology ?Patient Name: Cynthia Ruiz ?Procedure Date: 08/09/2021 1:27 PM ?MRN: [id number] ?Account #: [id number] ?Date of Birth: 01-Mar-1966 ?Admit Type: Outpatient ?Age: 56 ?Room: [facility] ENDO ROOM 1 ?Gender: Female ?Note Status: Finalized ?Instrument Name: Colonoscope [id number] ?Procedure:             Colonoscopy ?Indications:           Rectal bleeding ?Providers:             [name] MD, MD ?Medicines:             Monitored Anesthesia Care ?Complications:         No immediate complications. ?Procedure:             Pre-Anesthesia Assessment: ?                       - Prior to the procedure, a History and Physical was  ?                       performed, and patient medications, allergies and  ?                       sensitivities were reviewed. The patient's tolerance  ?                       of previous anesthesia was reviewed. ?                       - The risks and benefits of the procedure and the  ?                       sedation options and risks were discussed with the  ?                       patient. All questions were answered and informed  ?                       consent was obtained. ?                       - ASA Grade Assessment: II - A patient with mild  ?                       systemic disease. ?                       After obtaining informed consent, the colonoscope was  ?                       passed under direct vision. Throughout the procedure,  ?                       the patient's blood pressure, pulse, and oxygen  ?                       saturations were monitored continuously. The  ?                       Colonoscope was introduced through the anus and  ?  advanced to the the cecum, identified by the  ?                       appendiceal orifice. The colonoscopy was performed  ?                       with ease. The patient tolerated the procedure well.  ?                       The quality of the bowel preparation was  excellent. ?Findings: ?     The perianal and digital rectal examinations were normal. ?     Non-bleeding internal hemorrhoids were found during retroflexion. The  ?     hemorrhoids were severe, large and Grade II (internal hemorrhoids that  ?     prolapse but reduce spontaneously). ?     The exam was otherwise without abnormality on direct and retroflexion  ?     views. ?Impression:            - Non-bleeding internal hemorrhoids. ?                       - The examination was otherwise normal on direct and  ?                       retroflexion views. ?                       - No specimens collected. ?Recommendation:        - Discharge patient to home (with escort). ?                       - Resume previous diet. ?                       - Repeat colonoscopy in 10 years for screening  ?                       purposes. ?                       - Return to GI office in 1 week. ?                       - Return to clinic for hemorroidalo banding ?Procedure Code(s):     --- Professional --- ?                       [phone], Colonoscopy, flexible; diagnostic, including  ?                       collection of specimen(s) by brushing or washing, when  ?                       performed (separate procedure) ?Diagnosis Code(s):     --- Professional --- ?                       K64.1, Second degree hemorrhoids ?                       K62.5, Hemorrhage of anus and rectum ?CPT  copyright 2019 American Medical Association. All rights reserved. ?The codes documented in this report are preliminary and upon coder review may  ?be revised to meet current compliance requirements. ?Wyline MoodKiran Brynley Cuddeback, MD ?Wyline MoodKiran Jezelle Gullick MD, MD ?08/09/2021 1:49:38 PM ?This report has been signed electronically. ?Number of Addenda: 0 ?Note Initiated On: 08/09/2021 1:27 PM ?Scope Withdrawal Time: 0 hours 7 minutes 25 seconds  ?Total Procedure Duration: 0 hours 16 minutes 35 seconds  ?Estimated Blood Loss:  Estimated blood loss: none. ?     [facility] ?

## 2021-08-09 NOTE — Transfer of Care (Signed)
Immediate Anesthesia Transfer of Care Note ? ?Patient: Cynthia Ruiz ? ?Procedure(s) Performed: COLONOSCOPY WITH PROPOFOL ? ?Patient Location: PACU and Endoscopy Unit ? ?Anesthesia Type:General ? ?Level of Consciousness: awake ? ?Airway & Oxygen Therapy: Patient Spontanous Breathing ? ?Post-op Assessment: Report given to RN ? ?Post vital signs: stable ? ?Last Vitals:  ?Vitals Value Taken Time  ?BP 86/47 08/09/21 1352  ?Temp    ?Pulse 95 08/09/21 1352  ?Resp 13 08/09/21 1352  ?SpO2 98 % 08/09/21 1352  ?Vitals shown include unvalidated device data. ? ?Last Pain:  ?Vitals:  ? 08/09/21 1219  ?TempSrc: Temporal  ?PainSc: 0-No pain  ?   ? ?  ? ?Complications: No notable events documented. ?

## 2021-08-09 NOTE — H&P (Signed)
? ? ? ?Cynthia Mood, MD ?[address], [place], [place], [address] ?[address], [address], [place], [place], [address] ?Phone: [phone]  ?Fax: [phone] ? ?Primary Care Physician:  Sherlene Shams, MD ? ? ?Pre-Procedure History & Physical: ?HPI:  Cynthia Ruiz is a 56 y.o. female is here for an colonoscopy. ?  ?Past Medical History:  ?Diagnosis Date  ? Anxiety   ? ? ?Past Surgical History:  ?Procedure Laterality Date  ? CESAREAN SECTION  1991, 2002, 2007  ? COLONOSCOPY WITH PROPOFOL N/A 07/18/2015  ? Procedure: COLONOSCOPY WITH PROPOFOL;  Surgeon: Earline Mayotte, MD;  Location: [facility] ENDOSCOPY;  Service: Endoscopy;  Laterality: N/A;  ? KNEE ARTHROSCOPY Right 1991  ? Pyloric Stenosis surgery    ? TUBAL LIGATION  2007  ? ? ?Prior to Admission medications   ?Medication Sig Start Date End Date Taking? Authorizing Provider  ?PARoxetine (PAXIL) 10 MG tablet Take 0.5 tablets (5 mg total) by mouth daily. With breakfast 08/02/21  Yes Sherlene Shams, MD  ?polyethylene glycol-electrolytes (NULYTELY) 420 g solution Prepare according to package instructions. Starting at 5:00 PM: Drink one 8 oz glass of mixture every 15 minutes until you finish half of the jug. Five hours prior to procedure, drink 8 oz glass of mixture every 15 minutes until it is all gone. Make sure you do not drink anything 4 hours prior to your procedure. ?Patient not taking: Reported on 08/02/2021 05/27/21   Cynthia Mood, MD  ?fluticasone [facility]) 50 MCG/ACT nasal spray Place 2 sprays into both nostrils daily. ?Patient not taking: Reported on 08/09/2021 11/02/20   Bennie Pierini, FNP  ?Multiple Vitamins-Minerals (MULTIVITAMIN WOMEN PO) Take by mouth daily.    [provider]  ? ? ?Allergies as of 05/23/2021  ? (No Known Allergies)  ? ? ?Family History  ?Problem Relation Age of Onset  ? Arthritis Mother 37  ?     rheumatoid arthritis - died 1  ? Hypertension Father   ? Glaucoma Father   ? Cancer Father 55  ?     multiple colon  polyps,  not CA   ? Multiple sclerosis Sister 75  ? Diabetes Paternal Grandfather   ? Down syndrome Daughter   ? Cancer Maternal Grandmother   ?     bile duct  ? Cancer Paternal Grandmother   ?     unsure what type  ? Colon cancer Maternal Uncle   ? Breast cancer Neg Hx   ? ? ?Social History  ? ?Socioeconomic History  ? Marital status: Married  ?  Spouse name: Mamta Rimmer  ? Number of children: 3  ? Years of education: 30  ? Highest education level: Not on file  ?Occupational History  ? Occupation: Part time Lawyer  ?  Comment: [employer]  ?Tobacco Use  ? Smoking status: Never  ? Smokeless tobacco: Never  ?Vaping Use  ? Vaping Use: Never used  ?Substance and Sexual Activity  ? Alcohol use: Not Currently  ? Drug use: No  ? Sexual activity: Yes  ?  Partners: Male  ?  Birth control/protection: Surgical  ?Other Topics Concern  ? Not on file  ?Social History Narrative  ? [name] grew up in [place]. She attended [employer] in [place] and obtained her Bachelors degree in Sociology. 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 She currently lives in [place] with her husband, [name],  and 2 of their 3 children. She has 1 son who is in the [employer] stationed in [place]. 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 Her main focus is her children. She enjoys going out with her husband and socializing with friends. She also does a lot of volunteer work in [employer]. 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  ? ?Social Determinants of Health  ? ?Financial Resource Strain: Not on file  ?Food Insecurity: Not on file  ?Transportation Needs: Not on file  ?Physical Activity: Not on file  ?Stress: Not on file  ?Social Connections: Not on file  ?Intimate Partner Violence: Not on file  ? ? ?Review of Systems: ?See HPI, otherwise negative ROS ? ?Physical Exam: ?BP (!)  158/86   Pulse (!) 103   Temp 97.9 ?F (36.6 ?C) (Temporal)   Resp 18   Ht 5\' 3"  (1.6 m)   Wt 69.4 kg   LMP 02/06/2015 (Exact Date)   SpO2 100%   BMI 27.10 kg/m?  ?General:   Alert,  pleasant and cooperative in NAD ?Head:  Normocephalic and atraumatic. ?Neck:  Supple; no masses or thyromegaly. ?Lungs:  Clear throughout to auscultation, normal respiratory effort.    ?Heart:  +S1, +S2, Regular rate and rhythm, No edema. ?Abdomen:  Soft, nontender and nondistended. Normal bowel sounds, without guarding, and without rebound.   ?Neurologic:  Alert and  oriented x4;  grossly normal neurologically. ? ?Impression/Plan: ?CHERISE FEDDER is here for an colonoscopy to be performed for rectal bleeding. Risks, benefits, limitations, and alternatives regarding  colonoscopy have been reviewed with the patient.  Questions have been answered.  All parties agreeable. ? ? ?[name], MD  08/09/2021, 1:22 PM ?\ ?

## 2021-08-09 NOTE — Anesthesia Postprocedure Evaluation (Signed)
Anesthesia Post Note ? ?Patient: Cynthia Ruiz ? ?Procedure(s) Performed: COLONOSCOPY WITH PROPOFOL ? ?Patient location during evaluation: Endoscopy ?Anesthesia Type: General ?Level of consciousness: awake and alert ?Pain management: pain level controlled ?Vital Signs Assessment: post-procedure vital signs reviewed and stable ?Respiratory status: spontaneous breathing, nonlabored ventilation and respiratory function stable ?Cardiovascular status: blood pressure returned to baseline and stable ?Postop Assessment: no apparent nausea or vomiting ?Anesthetic complications: no ? ? ?No notable events documented. ? ? ?Last Vitals:  ?Vitals:  ? 08/09/21 1351 08/09/21 1401  ?BP: (!) 86/47 109/72  ?Pulse:    ?Resp:    ?Temp: 36.8 ?C   ?SpO2:    ?  ?Last Pain:  ?Vitals:  ? 08/09/21 1401  ?TempSrc:   ?PainSc: 0-No pain  ? ? ?  ?  ?  ?  ?  ?  ? ?Foye Deer ? ? ? ? ?

## 2021-08-09 NOTE — Anesthesia Preprocedure Evaluation (Signed)
Anesthesia Evaluation  ?Patient identified by MRN, date of birth, ID band ?Patient awake ? ? ? ?Reviewed: ?Allergy & Precautions, NPO status , Patient's Chart, lab work & pertinent test results ? ?Airway ?Mallampati: II ? ?TM Distance: >3 FB ?Neck ROM: full ? ? ? Dental ?no notable dental hx. ? ?  ?Pulmonary ?neg pulmonary ROS,  ?  ?Pulmonary exam normal ? ? ? ? ? ? ? Cardiovascular ?negative cardio ROS ?Normal cardiovascular exam ? ? ?  ?Neuro/Psych ?PSYCHIATRIC DISORDERS Anxiety negative neurological ROS ?   ? GI/Hepatic ?Neg liver ROS, fecal occult blood test positive ?  ?Endo/Other  ?negative endocrine ROS ? Renal/GU ?negative Renal ROS  ?negative genitourinary ?  ?Musculoskeletal ? ? Abdominal ?Normal abdominal exam  (+)   ?Peds ? Hematology ?negative hematology ROS ?(+)   ?Anesthesia Other Findings ?Past Medical History: ?No date: Anxiety ? ?Past Surgical History: ?1991, 2002, 2007: CESAREAN SECTION ?07/18/2015: COLONOSCOPY WITH PROPOFOL; N/A ?    Comment:  Procedure: COLONOSCOPY WITH PROPOFOL;  Surgeon: Tinnie Gens  ?             Brion Aliment, MD;  Location: [facility] ENDOSCOPY;  Service:  ?             Endoscopy;  Laterality: N/A; ?1991: KNEE ARTHROSCOPY; Right ?No date: Pyloric Stenosis surgery ?2007: TUBAL LIGATION ? ?BMI   ? Body Mass Index: 27.10 kg/m?  ?  ? ? Reproductive/Obstetrics ?negative OB ROS ? ?  ? ? ? ? ? ? ? ? ? ? ? ? ? ?  ?  ? ? ? ? ? ? ? ? ?Anesthesia Physical ?Anesthesia Plan ? ?ASA: 2 ? ?Anesthesia Plan: General  ? ?Post-op Pain Management:   ? ?Induction: Intravenous ? ?PONV Risk Score and Plan: Propofol infusion and TIVA ? ?Airway Management Planned: Natural Airway ? ?Additional Equipment:  ? ?Intra-op Plan:  ? ?Post-operative Plan:  ? ?Informed Consent: I have reviewed the patients History and Physical, chart, labs and discussed the procedure including the risks, benefits and alternatives for the proposed anesthesia with the patient or authorized representative who  has indicated his/her understanding and acceptance.  ? ? ? ?Dental Advisory Given ? ?Plan Discussed with: Anesthesiologist, CRNA and Surgeon ? ?Anesthesia Plan Comments:   ? ? ? ? ? ? ?Anesthesia Quick Evaluation ? ?

## 2021-08-12 ENCOUNTER — Encounter: Payer: Self-pay | Admitting: Gastroenterology

## 2021-08-15 ENCOUNTER — Other Ambulatory Visit: Payer: Self-pay

## 2021-08-24 ENCOUNTER — Other Ambulatory Visit: Payer: Self-pay | Admitting: Internal Medicine

## 2021-08-24 DIAGNOSIS — F411 Generalized anxiety disorder: Secondary | ICD-10-CM

## 2021-08-26 ENCOUNTER — Ambulatory Visit: Payer: BC Managed Care – PPO | Admitting: Gastroenterology

## 2021-08-30 ENCOUNTER — Encounter: Payer: Self-pay | Admitting: Internal Medicine

## 2021-08-30 ENCOUNTER — Ambulatory Visit (INDEPENDENT_AMBULATORY_CARE_PROVIDER_SITE_OTHER): Payer: BC Managed Care – PPO | Admitting: Internal Medicine

## 2021-08-30 DIAGNOSIS — K5901 Slow transit constipation: Secondary | ICD-10-CM

## 2021-08-30 DIAGNOSIS — F411 Generalized anxiety disorder: Secondary | ICD-10-CM

## 2021-08-30 DIAGNOSIS — F430 Acute stress reaction: Secondary | ICD-10-CM

## 2021-08-30 DIAGNOSIS — K648 Other hemorrhoids: Secondary | ICD-10-CM

## 2021-08-30 DIAGNOSIS — R0681 Apnea, not elsewhere classified: Secondary | ICD-10-CM | POA: Diagnosis not present

## 2021-08-30 MED ORDER — PAROXETINE HCL 10 MG PO TABS: 10.0000 mg | ORAL_TABLET | Freq: Every day | ORAL | 1 refills | Status: DC

## 2021-08-30 MED ORDER — HYDROCORTISONE ACETATE 25 MG RE SUPP
25.0000 mg | Freq: Two times a day (BID) | RECTAL | 0 refills | Status: DC
Start: 2021-08-30 — End: 2021-12-06

## 2021-08-30 NOTE — Assessment & Plan Note (Addendum)
Quite large,  By April 28 colonoscopy.  She is scheduled for a banding procedure June 1 but is planning to defer

## 2021-08-30 NOTE — Patient Instructions (Addendum)
Glycerin suppositories  may help prevent more internal hemorrhoid bleeding  and ease passage of stool  Try taking a magnesium supplement for the constipation 250 mg dose to start  Increase paxil to 10 mg daily

## 2021-08-30 NOTE — Progress Notes (Signed)
Subjective:  Patient ID: [name], female    DOB: June 06, 1965  Age: 56 y.o. MRN: [id number]  CC: Diagnoses of Internal hemorrhoid, Anxiety as acute reaction to gross stress, Slow transit constipation, and Witnessed apneic spells were pertinent to this visit.   HPI ANETTE BARRA presents for follow up on multiple issues  Chief Complaint  Patient presents with   Follow-up    1 month follow up     1) Large internal hemorrhoids:  noted on recent colonoscopy.  She has no persistent symptoms but was scheduled for banding during her recovery from colonoscopy and has only had one episode of bleeding  which occurred in February after a hard stool  (prior to the colonoscopy) .   2) Depression:  started paxil one month ago . Tolerating 0.5 tablet   without nausea . Has had some very emotionally stressful days,  husband still aggravating her. She continues to feel that he functions less as a partner and more as a roommate who does not share equally in the familial responsibilities .  Her daughter Barbette Reichmann  and is moving back in after a trip to [place] and she anticipates that the extra pair of hands will help manage her load if she addresses  the workload up front.  Her best friend is helping her define the division of duties.Marland Kitchen  3) Snoring,  daytime fatigue.  She is waiting for the  REFERRAL FOR SLEEP STUDY   Outpatient Medications Prior to Visit  Medication Sig Dispense Refill   fluticasone (FLONASE) 50 MCG/ACT nasal spray Place 2 sprays into both nostrils daily. 16 g 6   Multiple Vitamins-Minerals (MULTIVITAMIN WOMEN PO) Take by mouth daily.     PARoxetine (PAXIL) 10 MG tablet TAKE 0.5 TABLETS (5 MG TOTAL) BY MOUTH DAILY. WITH BREAKFAST 45 tablet 1   polyethylene glycol-electrolytes (NULYTELY) 420 g solution Prepare according to package instructions. Starting at 5:00 PM: Drink one 8 oz glass of mixture every 15 minutes until you finish half of the jug. Five hours prior to procedure, drink 8 oz  glass of mixture every 15 minutes until it is all gone. Make sure you do not drink anything 4 hours prior to your procedure. (Patient not taking: Reported on 08/02/2021) 4000 mL 0   No facility-administered medications prior to visit.    Review of Systems;  Patient denies headache, fevers, malaise, unintentional weight loss, skin rash, eye pain, sinus congestion and sinus pain, sore throat, dysphagia,  hemoptysis , cough, dyspnea, wheezing, chest pain, palpitations, orthopnea, edema, abdominal pain, nausea, melena, diarrhea, constipation, flank pain, dysuria, hematuria, urinary  Frequency, nocturia, numbness, tingling, seizures,  Focal weakness, Loss of consciousness,  Tremor, insomnia, depression, anxiety, and suicidal ideation.      Objective:  BP 130/88 (BP Location: Left Arm, Patient Position: Sitting, Cuff Size: Normal)   Pulse 88   Temp 98.2 F (36.8 C) (Oral)   Ht 5\' 3"  (1.6 m)   Wt 154 lb 3.2 oz (69.9 kg)   LMP 02/06/2015 (Exact Date)   SpO2 96%   BMI 27.32 kg/m   BP Readings from Last 3 Encounters:  08/30/21 130/88  08/09/21 109/72  08/02/21 136/84    Wt Readings from Last 3 Encounters:  08/30/21 154 lb 3.2 oz (69.9 kg)  08/09/21 153 lb (69.4 kg)  08/02/21 155 lb 9.6 oz (70.6 kg)    General appearance: alert, cooperative and appears stated age Ears: normal TM's and external ear canals both ears Throat: lips, mucosa,  and tongue normal; teeth and gums normal Neck: no adenopathy, no carotid bruit, supple, symmetrical, trachea midline and thyroid not enlarged, symmetric, no tenderness/mass/nodules Back: symmetric, no curvature. ROM normal. No CVA tenderness. Lungs: clear to auscultation bilaterally Heart: regular rate and rhythm, S1, S2 normal, no murmur, click, rub or gallop Abdomen: soft, non-tender; bowel sounds normal; no masses,  no organomegaly Pulses: 2+ and symmetric Skin: Skin color, texture, turgor normal. No rashes or lesions Lymph nodes: Cervical,  supraclavicular, and axillary nodes normal.  Lab Results  Component Value Date   HGBA1C 5.5 03/29/2018   HGBA1C 5.6 03/12/2017   HGBA1C 5.4 02/11/2016    Lab Results  Component Value Date   CREATININE 0.81 08/02/2021   CREATININE 0.91 05/13/2021   CREATININE 0.85 02/20/2020    Lab Results  Component Value Date   WBC 4.8 05/13/2021   HGB 12.3 05/13/2021   HCT 37.1 05/13/2021   PLT 272.0 05/13/2021   GLUCOSE 95 08/02/2021   CHOL 125 02/20/2020   TRIG 57.0 02/20/2020   HDL 54.80 02/20/2020   LDLCALC 59 02/20/2020   ALT 13 05/13/2021   AST 19 05/13/2021   NA 137 08/02/2021   K 3.9 08/02/2021   CL 104 08/02/2021   CREATININE 0.81 08/02/2021   BUN 19 08/02/2021   CO2 26 08/02/2021   TSH 3.50 08/02/2021   HGBA1C 5.5 03/29/2018   MICROALBUR <0.7 02/11/2016    No results found.  Assessment & Plan:   Problem List Items Addressed This Visit     Witnessed apneic spells    With nocturnal wakenings accompanied by palpitations/tachycardia . Sleep study ordered to rule out OSA       Internal hemorrhoids without complication    Quite large,  By April 28 colonoscopy.  She is scheduled for a banding procedure June 1 but is planning to defer        Constipation    Secondary to internal hemorrhoids per colonscopy.  No fissure. Recommending use of glycerin suppositories and  rx for anusol hc given        Anxiety as acute reaction to gross stress    Advised to increase paxil dose to 10 mg daily        Relevant Medications   PARoxetine (PAXIL) 10 MG tablet    I spent a total of  30 minutes with this patient in a face to face visit on the date of this encounter reviewing  her colonoscopy ,  her  last office visit with me one month ago  and providing continued counselling on management of her marital conflict,  as well as    post visit ordering of testing and therapeutics.    Follow-up: Return in about 3 months (around 11/30/2021).   Sherlene Shams, MD

## 2021-09-01 NOTE — Assessment & Plan Note (Signed)
With nocturnal wakenings accompanied by palpitations/tachycardia . Sleep study ordered to rule out OSA ?

## 2021-09-01 NOTE — Assessment & Plan Note (Signed)
Advised to increase paxil dose to 10 mg daily

## 2021-09-01 NOTE — Assessment & Plan Note (Signed)
Secondary to internal hemorrhoids per colonscopy.  No fissure. Recommending use of glycerin suppositories and  rx for anusol hc given

## 2021-09-02 ENCOUNTER — Telehealth: Payer: Self-pay | Admitting: Internal Medicine

## 2021-09-02 NOTE — Telephone Encounter (Signed)
FYI- order and ofc notes was faxed to [facility] sleep med. And pt was informed as well.

## 2021-09-03 ENCOUNTER — Other Ambulatory Visit: Payer: Self-pay | Admitting: Internal Medicine

## 2021-09-03 MED ORDER — HYDROCORT-PRAMOXINE (PERIANAL) 2.5-1 % EX CREA: 1.0000 "application " | TOPICAL_CREAM | Freq: Three times a day (TID) | CUTANEOUS | 0 refills | Status: DC

## 2021-09-04 ENCOUNTER — Telehealth: Payer: Self-pay

## 2021-09-04 NOTE — Telephone Encounter (Signed)
This is the second rx that is not covered.  can you find out what IS covered?  Anusol hC is supposed to be covered  but the cost for 12 suppositories was $82 per patient !

## 2021-09-04 NOTE — Telephone Encounter (Signed)
Received a fax from pharmacy stating that the analpram-hc is not covered by the pt's insurance.

## 2021-09-05 NOTE — Telephone Encounter (Signed)
Spoke with pharmacist and he changed the rx to a bigger single tube and used a coupon so it would only cost the pt $43.35. Pt is aware and okay with the price.

## 2021-09-12 ENCOUNTER — Ambulatory Visit: Payer: BC Managed Care – PPO | Admitting: Gastroenterology

## 2021-09-27 ENCOUNTER — Telehealth: Payer: Self-pay

## 2021-09-27 NOTE — Telephone Encounter (Signed)
Error

## 2021-12-05 ENCOUNTER — Encounter: Payer: Self-pay | Admitting: Internal Medicine

## 2021-12-06 ENCOUNTER — Other Ambulatory Visit: Payer: Self-pay | Admitting: Internal Medicine

## 2021-12-06 ENCOUNTER — Ambulatory Visit (INDEPENDENT_AMBULATORY_CARE_PROVIDER_SITE_OTHER): Payer: BC Managed Care – PPO | Admitting: Internal Medicine

## 2021-12-06 ENCOUNTER — Encounter: Payer: Self-pay | Admitting: Internal Medicine

## 2021-12-06 VITALS — BP 118/78 | HR 96 | Temp 98.1°F | Ht 63.0 in | Wt 150.6 lb

## 2021-12-06 DIAGNOSIS — R7301 Impaired fasting glucose: Secondary | ICD-10-CM | POA: Diagnosis not present

## 2021-12-06 DIAGNOSIS — R195 Other fecal abnormalities: Secondary | ICD-10-CM

## 2021-12-06 DIAGNOSIS — E785 Hyperlipidemia, unspecified: Secondary | ICD-10-CM | POA: Diagnosis not present

## 2021-12-06 DIAGNOSIS — F430 Acute stress reaction: Secondary | ICD-10-CM

## 2021-12-06 DIAGNOSIS — F411 Generalized anxiety disorder: Secondary | ICD-10-CM

## 2021-12-06 DIAGNOSIS — Z1231 Encounter for screening mammogram for malignant neoplasm of breast: Secondary | ICD-10-CM

## 2021-12-06 DIAGNOSIS — R0681 Apnea, not elsewhere classified: Secondary | ICD-10-CM

## 2021-12-06 DIAGNOSIS — E038 Other specified hypothyroidism: Secondary | ICD-10-CM

## 2021-12-06 LAB — COMPREHENSIVE METABOLIC PANEL
ALT: 10 U/L (ref 0–35)
AST: 18 U/L (ref 0–37)
Albumin: 4.3 g/dL (ref 3.5–5.2)
Alkaline Phosphatase: 66 U/L (ref 39–117)
BUN: 16 mg/dL (ref 6–23)
CO2: 28 mEq/L (ref 19–32)
Calcium: 9.2 mg/dL (ref 8.4–10.5)
Chloride: 104 mEq/L (ref 96–112)
Creatinine, Ser: 0.85 mg/dL (ref 0.40–1.20)
GFR: 75.74 mL/min (ref >60.00)
Glucose, Bld: 79 mg/dL (ref 70–99)
Potassium: 4.3 mEq/L (ref 3.5–5.1)
Sodium: 136 mEq/L (ref 135–145)
Total Bilirubin: 0.3 mg/dL (ref 0.2–1.2)
Total Protein: 7.4 g/dL (ref 6.0–8.3)

## 2021-12-06 LAB — LDL CHOLESTEROL, DIRECT: Direct LDL: 65 mg/dL

## 2021-12-06 LAB — HEMOGLOBIN A1C: Hgb A1c MFr Bld: 5.9 % (ref 4.6–6.5)

## 2021-12-06 LAB — LIPID PANEL
Cholesterol: 144 mg/dL (ref 0–200)
HDL: 55 mg/dL (ref >39.00)
LDL Cholesterol: 71 mg/dL (ref 0–99)
NonHDL: 89.11
Total CHOL/HDL Ratio: 3
Triglycerides: 93 mg/dL (ref 0.0–149.0)
VLDL: 18.6 mg/dL (ref 0.0–40.0)

## 2021-12-06 MED ORDER — ALPRAZOLAM 0.25 MG PO TABS: 0.2500 mg | ORAL_TABLET | Freq: Two times a day (BID) | ORAL | 0 refills | Status: DC | PRN

## 2021-12-06 MED ORDER — PAROXETINE HCL ER 12.5 MG PO TB24: 12.5000 mg | ORAL_TABLET | Freq: Every day | ORAL | 1 refills | Status: DC

## 2021-12-06 NOTE — Progress Notes (Signed)
Subjective:  Patient ID: Cynthia Ruiz, female    DOB: Jan 20, 1966  Age: 56 y.o. MRN: [id number]  CC: The primary encounter diagnosis was Hyperlipidemia LDL goal <160. Diagnoses of Impaired fasting glucose, Positive colorectal cancer screening using Cologuard test, and Anxiety as acute reaction to gross stress were also pertinent to this visit.   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 So more responsibility preparing lesson plans.   Aggravated by Rachel's school opening delay due to mold in the schools  . Her oldest daughter is helping since she is home  after graduating from [employer]) .    Taking paxil .  Still frustrated by the lack of cooperation/connection with husband , so she gets irritated at him over minor things .  She has had therapy /counselling  but remains unable to forgive him for the things he did that jeopardized their financial state      Outpatient Medications Prior to Visit  Medication Sig Dispense Refill   fluticasone (FLONASE) 50 MCG/ACT nasal spray Place 2 sprays into both nostrils daily. 16 g 6   Multiple Vitamins-Minerals (MULTIVITAMIN WOMEN PO) Take by mouth daily.     PARoxetine (PAXIL) 10 MG tablet Take 1 tablet (10 mg total) by mouth daily. With breakfast 90 tablet 1   hydrocortisone (ANUSOL-HC) 25 MG suppository Place 1 suppository (25 mg total) rectally 2 (two) times daily. (Patient not taking: Reported on 12/06/2021) 12 suppository 0   hydrocortisone-pramoxine (ANALPRAM-HC) 2.5-1 % rectal cream Place 1 application. rectally 3 (three) times daily. (Patient not taking: Reported on 12/06/2021) 30 g 0   No facility-administered medications prior to visit.    Review of Systems;  Patient denies headache, fevers,  malaise, unintentional weight loss, skin rash, eye pain, sinus congestion and sinus pain, sore throat, dysphagia,  hemoptysis , cough, dyspnea, wheezing, chest pain, palpitations, orthopnea, edema, abdominal pain, nausea, melena, diarrhea, constipation, flank pain, dysuria, hematuria, urinary  Frequency, nocturia, numbness, tingling, seizures,  Focal weakness, Loss of consciousness,  Tremor, insomnia, depression, anxiety, and suicidal ideation.      Objective:  BP 118/78 (BP Location: Left Arm, Patient Position: Sitting, Cuff Size: Normal)   Pulse 96   Temp 98.1 F (36.7 C) (Oral)   Ht 5\' 3"  (1.6 m)   Wt 150 lb 9.6 oz (68.3 kg)   LMP 01/13/2015 (Approximate)   SpO2 98%   BMI 26.68 kg/m   BP Readings from Last 3 Encounters:  12/06/21 118/78  08/30/21 130/88  08/09/21 109/72    Wt Readings from Last 3 Encounters:  12/06/21 150 lb 9.6 oz (68.3 kg)  08/30/21 154 lb 3.2 oz (69.9 kg)  08/09/21 153 lb (69.4 kg)    General appearance: alert, cooperative and appears stated age Ears: normal TM's and external ear canals both ears Throat: lips, mucosa, and tongue normal; teeth and gums normal Neck: no adenopathy, no carotid bruit, supple, symmetrical, trachea midline and thyroid not enlarged, symmetric, no tenderness/mass/nodules Back: symmetric, no curvature. ROM normal. No CVA tenderness. Lungs: clear to auscultation bilaterally Heart: regular rate and rhythm, S1, S2 normal, no murmur, click, rub or gallop Abdomen: soft, non-tender; bowel sounds normal; no masses,  no organomegaly Pulses: 2+ and  symmetric Skin: Skin color, texture, turgor normal. No rashes or lesions Lymph nodes: Cervical, supraclavicular, and axillary nodes normal.  Lab Results  Component Value Date   HGBA1C 5.5 03/29/2018   HGBA1C 5.6 03/12/2017   HGBA1C 5.4 02/11/2016    Lab Results  Component Value Date   CREATININE 0.81 08/02/2021   CREATININE 0.91 05/13/2021   CREATININE 0.85 02/20/2020    Lab  Results  Component Value Date   WBC 4.8 05/13/2021   HGB 12.3 05/13/2021   HCT 37.1 05/13/2021   PLT 272.0 05/13/2021   GLUCOSE 95 08/02/2021   CHOL 125 02/20/2020   TRIG 57.0 02/20/2020   HDL 54.80 02/20/2020   LDLCALC 59 02/20/2020   ALT 13 05/13/2021   AST 19 05/13/2021   NA 137 08/02/2021   K 3.9 08/02/2021   CL 104 08/02/2021   CREATININE 0.81 08/02/2021   BUN 19 08/02/2021   CO2 26 08/02/2021   TSH 3.50 08/02/2021   HGBA1C 5.5 03/29/2018   MICROALBUR <0.7 02/11/2016    No results found.  Assessment & Plan:   Problem List Items Addressed This Visit     Anxiety as acute reaction to gross stress    She describes feeling of PTSD following her husband's actions that led to their  Financial troubles. She has not forgiven him and does not trust him to make similar mistakes in the future.  counselling given.  Advised her to have a calm discussion with him and request full disclosure of all creditors as well as  Asking him to formally acknowledge his mistake.  Stressed the importance of remaining calm during the discussion . Changing paxil to CR.  Adding prn alprazolam.        Relevant Medications   ALPRAZolam (XANAX) 0.25 MG tablet   PARoxetine (PAXIL CR) 12.5 MG 24 hr tablet   Hyperlipidemia LDL goal <160 - Primary   Relevant Orders   Lipid Profile   Direct LDL   Comp Met (CMET)   Positive colorectal cancer screening using Cologuard test    Normal colonoscopy April 2023. After positive COLOGUARD . Follow up ten years per Dr Darien Ramus       Other Visit Diagnoses     Impaired fasting glucose       Relevant Orders   Hemoglobin A1c       I spent a total of   minutes with this patient in a face to face visit on the date of this encounter reviewing the last office visit with me on        ,  most recent with patient's cardiologist in    ,  patient'ss diet and eating habits, home blood pressure readings ,  most recent imaging study ,   and post visit ordering of testing and  therapeutics.    Follow-up: No follow-ups on file.   Sherlene Shams, MD

## 2021-12-06 NOTE — Patient Instructions (Signed)
I am changing your Paxil to a 'controlled release" formula that should provide a better 24 hour control of your emotions and anxiety  You can double the dose after 1-2 weeks if you feel you need it   Use  the alprazolam for the discussion  you are going to have with your husband,  for confrontations that are likely to make you very angry or upset.  Do not use it daily

## 2021-12-06 NOTE — Assessment & Plan Note (Signed)
Normal colonoscopy April 2023. After positive COLOGUARD . Follow up ten years per Dr Darien Ramus

## 2021-12-06 NOTE — Assessment & Plan Note (Addendum)
She describes feeling of PTSD following her husband's actions that led to their  Financial troubles. She has not forgiven him and does not trust him to make similar mistakes in the future.  counselling given.  Advised her to have a calm discussion with him and request full disclosure of all creditors as well as  Asking him to formally acknowledge his mistake.  Stressed the importance of remaining calm during the discussion . Changing paxil to CR.  Adding prn alprazolam.

## 2021-12-07 NOTE — Assessment & Plan Note (Signed)
10 yr cardiac risk is < 5%.  No treatment indicated  Lab Results  Component Value Date   CHOL 144 12/06/2021   HDL 55.00 12/06/2021   LDLCALC 71 12/06/2021   LDLDIRECT 65.0 12/06/2021   TRIG 93.0 12/06/2021   CHOLHDL 3 12/06/2021

## 2021-12-07 NOTE — Assessment & Plan Note (Signed)
Home sleep test was ordered in April;  No results have been received.

## 2021-12-07 NOTE — Assessment & Plan Note (Signed)
Thyroid function is WNL without medication.  Lab Results  Component Value Date   TSH 3.50 08/02/2021

## 2022-01-10 ENCOUNTER — Ambulatory Visit
Admission: RE | Admit: 2022-01-10 | Discharge: 2022-01-10 | Disposition: A | Discharge status: Home / Self Care | Payer: BC Managed Care – PPO | Source: Ambulatory Visit | Attending: Internal Medicine | Admitting: Internal Medicine

## 2022-01-10 DIAGNOSIS — Z1231 Encounter for screening mammogram for malignant neoplasm of breast: Secondary | ICD-10-CM | POA: Diagnosis not present

## 2022-01-27 DIAGNOSIS — S92514A Nondisplaced fracture of proximal phalanx of right lesser toe(s), initial encounter for closed fracture: Secondary | ICD-10-CM | POA: Diagnosis not present

## 2022-01-28 DIAGNOSIS — S92514A Nondisplaced fracture of proximal phalanx of right lesser toe(s), initial encounter for closed fracture: Secondary | ICD-10-CM | POA: Diagnosis not present

## 2022-02-05 ENCOUNTER — Encounter: Payer: BC Managed Care – PPO | Admitting: Internal Medicine

## 2022-02-07 ENCOUNTER — Encounter: Payer: Self-pay | Admitting: Internal Medicine

## 2022-02-07 ENCOUNTER — Ambulatory Visit (INDEPENDENT_AMBULATORY_CARE_PROVIDER_SITE_OTHER): Payer: BC Managed Care – PPO

## 2022-02-07 DIAGNOSIS — Z23 Encounter for immunization: Secondary | ICD-10-CM | POA: Diagnosis not present

## 2022-03-04 ENCOUNTER — Encounter: Payer: BC Managed Care – PPO | Admitting: Internal Medicine

## 2022-03-12 ENCOUNTER — Telehealth: Payer: Self-pay | Admitting: Internal Medicine

## 2022-03-12 NOTE — Telephone Encounter (Signed)
Pt called stating she has vertigo that comes and goes. Sent to access nurse

## 2022-03-12 NOTE — Telephone Encounter (Signed)
Pt is scheduled with UC in [employer].

## 2022-03-13 ENCOUNTER — Ambulatory Visit
Admission: RE | Admit: 2022-03-13 | Discharge: 2022-03-13 | Disposition: A | Discharge status: Home / Self Care | Payer: BC Managed Care – PPO | Source: Ambulatory Visit

## 2022-03-13 VITALS — BP 135/84 | HR 93 | Temp 98.9°F | Resp 17

## 2022-03-13 DIAGNOSIS — H8110 Benign paroxysmal vertigo, unspecified ear: Secondary | ICD-10-CM

## 2022-03-13 NOTE — Discharge Instructions (Signed)
Recommend follow-up with your ENT provider.

## 2022-03-13 NOTE — ED Triage Notes (Signed)
Pt. Presents to UC w/ c/o of intermittent dizziness for the past week. Pt. Has been treating herself for a sinus infection w/ OTC medications to help. Pt. Also states she may have some stress related causes as well.

## 2022-03-13 NOTE — ED Provider Notes (Signed)
Cynthia Ruiz    CSN: [id number] Arrival date & time: 03/13/22  1146      History   Chief Complaint Chief Complaint  Patient presents with   Dizziness    Have been having bouts of dizziness/vertigo since last week. Today have headache, dry nose, ears feel clogged at times. Called my doctor & they have no available appointments till next week. - Entered by patient    HPI Cynthia Ruiz is a 56 y.o. female.    Dizziness   Presents to urgent care with complaint of intermittent dizziness for the past week.  Patient has been recently treating herself for sinus infection with OTC medications.  She also endorses some stress related issues.  Patient endorses some mild nasal congestion but denies any sinus pain or pressure.  She has been treating herself with Flonase and notes that her nasal cavities feel dry.  She denies any nasal output or mucus production.  She does not have cough.  Patient has been treated in the past for vertigo and wonders if her symptoms are from this.  She looked up a video on the Epley maneuver online and has been trying to treat herself, apparently unsuccessfully.  She has over-the-counter meclizine 25 mg that she purchased on [place] and has been taking this once a day as directed on the bottle.  She states that this is not providing much relief either.  Past Medical History:  Diagnosis Date   Anxiety     Patient Active Problem List   Diagnosis Date Noted   Paroxysmal supraventricular tachycardia 08/04/2021   Witnessed apneic spells 08/02/2021   Internal hemorrhoids without complication 05/13/2021   Gastroesophageal reflux disease 05/13/2021   Left shoulder pain 02/02/2021   Subclinical hypothyroidism 02/02/2021   Hyperlipidemia LDL goal <160 01/30/2021   Insomnia 07/24/2020   Wears hearing aid 07/20/2020   Sciatica associated with disorder of lumbosacral spine 09/25/2019   Coccygeal pain, chronic 02/23/2019   Encounter for screening  laboratory testing for COVID-19 virus 01/29/2019   Constipation 01/29/2019   Right shoulder pain 07/01/2016   Anxiety as acute reaction to gross stress 02/12/2016   Encounter for screening colonoscopy 06/07/2015   Overweight 05/21/2015   Positive colorectal cancer screening using Cologuard test 06/20/2014   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   Rosacea 02/14/2013   Routine general medical examination at a health care facility 02/14/2013    Past Surgical History:  Procedure Laterality Date   CESAREAN SECTION  1991, 2002, 2007   COLONOSCOPY WITH PROPOFOL N/A 07/18/2015   Procedure: COLONOSCOPY WITH PROPOFOL;  Surgeon: Earline Mayotte, MD;  Location: [facility] ENDOSCOPY;  Service: Endoscopy;  Laterality: N/A;   COLONOSCOPY WITH PROPOFOL N/A 08/09/2021   Procedure: COLONOSCOPY WITH PROPOFOL;  Surgeon: [name], MD;  Location: [facility] ENDOSCOPY;  Service: Gastroenterology;  Laterality: N/A;   KNEE ARTHROSCOPY Right 1991   Pyloric Stenosis surgery     TUBAL LIGATION  2007    OB History     Gravida  3   Para  3   Term      Preterm      AB      Living         SAB      IAB      Ectopic      Multiple      Live Births           Obstetric Comments  1st Menstrual Cycle:  10  1st Pregnancy:  51  Home Medications    Prior to Admission medications   Medication Sig Start Date End Date Taking? Authorizing Provider  ALPRAZolam (XANAX) 0.25 MG tablet Take 1 tablet (0.25 mg total) by mouth 2 (two) times daily as needed for anxiety. 12/06/21   Sherlene Shams, MD  fluticasone (FLONASE) 50 MCG/ACT nasal spray Place 2 sprays into both nostrils daily. 11/02/20   Daphine Deutscher Mary-Margaret, FNP  linaclotide Karlene Einstein) 145 MCG CAPS capsule Take 1 tablet by mouth daily.    [provider]  Multiple Vitamins-Minerals (MULTIVITAMIN WOMEN PO) Take by mouth daily.    [provider]  PARoxetine (PAXIL CR) 12.5 MG 24 hr tablet Take 1 tablet (12.5 mg total) by mouth  daily. 12/06/21   Sherlene Shams, MD  polyethylene glycol-electrolytes (NULYTELY) 420 g solution PLEASE SEE ATTACHED FOR DETAILED DIRECTIONS    [provider]    Family History Family History  Problem Relation Age of Onset   Arthritis Mother 22       rheumatoid arthritis - died 33   Hypertension Father    Glaucoma Father    Cancer Father 73       multiple colon polyps,  not CA    Multiple sclerosis Sister 33   Diabetes Paternal Grandfather    Down syndrome Daughter    Cancer Maternal Grandmother        bile duct   Cancer Paternal Grandmother        unsure what type   Colon cancer Maternal Uncle    Breast cancer Neg Hx     Social History Social History   Tobacco Use   Smoking status: Never   Smokeless tobacco: Never  Vaping Use   Vaping Use: Never used  Substance Use Topics   Alcohol use: Not Currently   Drug use: No     Allergies   Patient has no known allergies.   Review of Systems Review of Systems  Neurological:  Positive for dizziness.     Physical Exam Triage Vital Signs ED Triage Vitals  Enc Vitals Group     BP 03/13/22 1208 135/84     Pulse Rate 03/13/22 1208 93     Resp 03/13/22 1208 17     Temp 03/13/22 1208 98.9 F (37.2 C)     Temp src --      SpO2 03/13/22 1208 99 %     Weight --      Height --      Head Circumference --      Peak Flow --      Pain Score 03/13/22 1207 0     Pain Loc --      Pain Edu? --      Excl. in GC? --    No data found.  Updated Vital Signs BP 135/84   Pulse 93   Temp 98.9 F (37.2 C)   Resp 17   LMP 01/13/2015 (Approximate)   SpO2 99%   Visual Acuity Right Eye Distance:   Left Eye Distance:   Bilateral Distance:    Right Eye Near:   Left Eye Near:    Bilateral Near:     Physical Exam Vitals reviewed.  Constitutional:      Appearance: Normal appearance.  HENT:     Right Ear: Tympanic membrane normal. No middle ear effusion. There is no impacted cerumen. Tympanic membrane is not  erythematous.     Left Ear:  No middle ear effusion. There is no impacted cerumen. Tympanic membrane  is perforated. Tympanic membrane is not erythematous.     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    Nose: No congestion or rhinorrhea.     Right Sinus: No maxillary sinus tenderness or frontal sinus tenderness.     Left Sinus: No maxillary sinus tenderness or frontal sinus tenderness.  Skin:    General: Skin is warm and dry.  Neurological:     General: No focal deficit present.     Mental Status: She is alert and oriented to person, place, and time.  Psychiatric:        Mood and Affect: Mood normal.        Behavior: Behavior normal.      UC Treatments / Results  Labs (all labs ordered are listed, but only abnormal results are displayed) Labs Reviewed - No data to display  EKG   Radiology No results found.  Procedures Procedures (including critical care time)  Medications Ordered in UC Medications - No data to display  Initial Impression / Assessment and Plan / UC Course  I have reviewed the triage vital signs and the nursing notes.  Pertinent labs & imaging results that were available during my care of the patient were reviewed by me and considered in my medical decision making (see chart for details).   Physical exam is remarkable only for a perforated left TM which patient states is her baseline.  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  There is no frontal or maxillary sinus tenderness with palpation.  There is some minor nasal congestion.  Possible vertigo which patient is already treating with meclizine.  Advised patient that she could increase the dose to multiple times daily which she plans to do.  Also reviewed details of Epley maneuver with her which she will try to do again at home, possibly with the assistance of her chiropractor.  Lastly, advised her to follow-up with an appointment at ENT if her symptoms do not  resolve.   Final Clinical Impressions(s) / UC Diagnoses   Final diagnoses:  None   Discharge Instructions   None    ED Prescriptions   None    PDMP not reviewed this encounter.   Charma Igo, [place] 03/13/22 1231

## 2022-04-04 DIAGNOSIS — H7202 Central perforation of tympanic membrane, left ear: Secondary | ICD-10-CM | POA: Diagnosis not present

## 2022-04-04 DIAGNOSIS — H8112 Benign paroxysmal vertigo, left ear: Secondary | ICD-10-CM | POA: Diagnosis not present

## 2022-04-16 ENCOUNTER — Encounter: Payer: BC Managed Care – PPO | Admitting: Internal Medicine

## 2022-04-22 DIAGNOSIS — R42 Dizziness and giddiness: Secondary | ICD-10-CM | POA: Diagnosis not present

## 2022-05-02 ENCOUNTER — Encounter: Payer: BC Managed Care – PPO | Admitting: Internal Medicine

## 2022-09-05 DIAGNOSIS — F4323 Adjustment disorder with mixed anxiety and depressed mood: Secondary | ICD-10-CM | POA: Diagnosis not present

## 2022-09-09 DIAGNOSIS — F4323 Adjustment disorder with mixed anxiety and depressed mood: Secondary | ICD-10-CM | POA: Diagnosis not present

## 2022-09-18 DIAGNOSIS — F4323 Adjustment disorder with mixed anxiety and depressed mood: Secondary | ICD-10-CM | POA: Diagnosis not present

## 2022-10-10 ENCOUNTER — Ambulatory Visit (INDEPENDENT_AMBULATORY_CARE_PROVIDER_SITE_OTHER): Payer: BC Managed Care – PPO | Admitting: Internal Medicine

## 2022-10-10 ENCOUNTER — Encounter: Payer: Self-pay | Admitting: Internal Medicine

## 2022-10-10 VITALS — BP 138/82 | HR 83 | Temp 98.1°F | Ht 63.0 in | Wt 160.8 lb

## 2022-10-10 DIAGNOSIS — R7303 Prediabetes: Secondary | ICD-10-CM | POA: Diagnosis not present

## 2022-10-10 DIAGNOSIS — E663 Overweight: Secondary | ICD-10-CM | POA: Diagnosis not present

## 2022-10-10 DIAGNOSIS — M5387 Other specified dorsopathies, lumbosacral region: Secondary | ICD-10-CM

## 2022-10-10 DIAGNOSIS — R0681 Apnea, not elsewhere classified: Secondary | ICD-10-CM

## 2022-10-10 DIAGNOSIS — K648 Other hemorrhoids: Secondary | ICD-10-CM

## 2022-10-10 DIAGNOSIS — H6121 Impacted cerumen, right ear: Secondary | ICD-10-CM

## 2022-10-10 DIAGNOSIS — E038 Other specified hypothyroidism: Secondary | ICD-10-CM | POA: Diagnosis not present

## 2022-10-10 DIAGNOSIS — N3090 Cystitis, unspecified without hematuria: Secondary | ICD-10-CM

## 2022-10-10 DIAGNOSIS — H6120 Impacted cerumen, unspecified ear: Secondary | ICD-10-CM | POA: Insufficient documentation

## 2022-10-10 DIAGNOSIS — R300 Dysuria: Secondary | ICD-10-CM

## 2022-10-10 LAB — POCT URINALYSIS DIPSTICK
Bilirubin, UA: NEGATIVE
Glucose, UA: NEGATIVE
Ketones, UA: NEGATIVE
Leukocytes, UA: NEGATIVE
Nitrite, UA: NEGATIVE
Protein, UA: NEGATIVE
Spec Grav, UA: >=1.03 — AB (ref 1.010–1.025)
Urobilinogen, UA: 0.2 E.U./dL
pH, UA: 5.5 (ref 5.0–8.0)

## 2022-10-10 LAB — CBC WITH DIFFERENTIAL/PLATELET
Basophils Absolute: 39 cells/uL (ref 0–200)
Eosinophils Absolute: 111 cells/uL (ref 15–500)
Eosinophils Relative: 1.7 %
MCHC: 33.1 g/dL (ref 32.0–36.0)
MCV: 87.9 fL (ref 80.0–100.0)
MPV: 11 fL (ref 7.5–12.5)
Neutro Abs: 3991 cells/uL (ref 1500–7800)
Neutrophils Relative %: 61.4 %
RDW: 13.1 % (ref 11.0–15.0)

## 2022-10-10 IMAGING — CR DG SHOULDER 2+V*L*
1 series · 3 of 3 positions shown · non-contrast
Comparison: None.

CLINICAL DATA: 54-year-old female with trauma to the left shoulder.

EXAM:
LEFT SHOULDER - 2+ VIEW

[Series 1: dg shoulder left · 0.14mm/px · 3 of 3 slices shown]
[im 1/3]
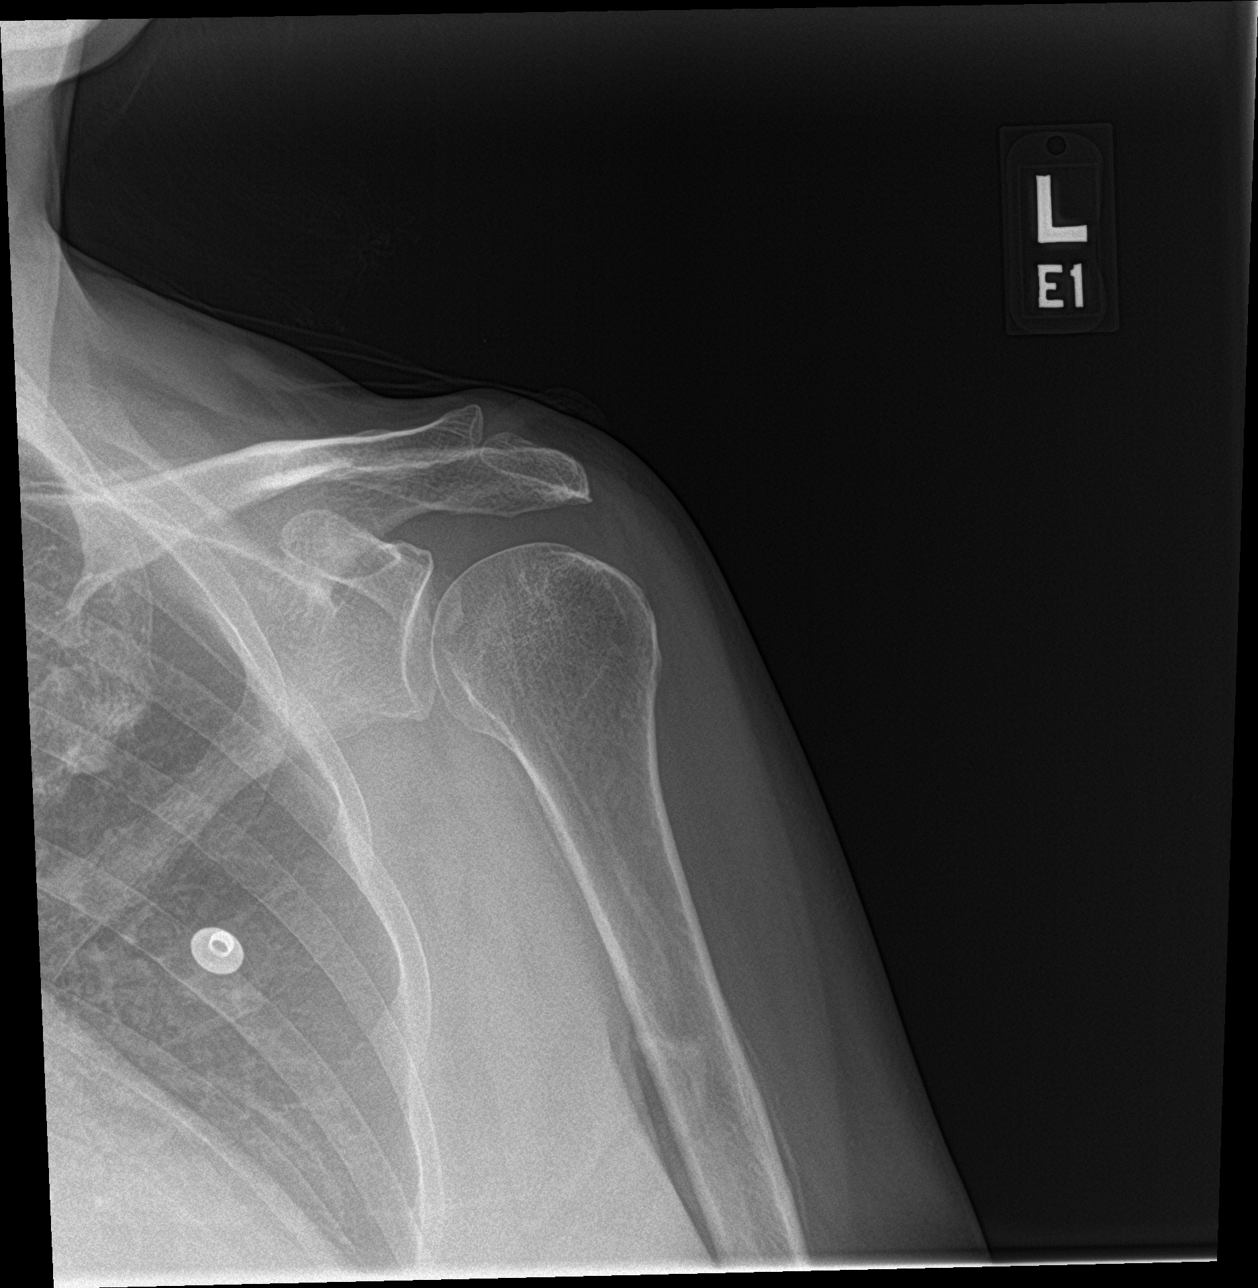
[im 2/3]
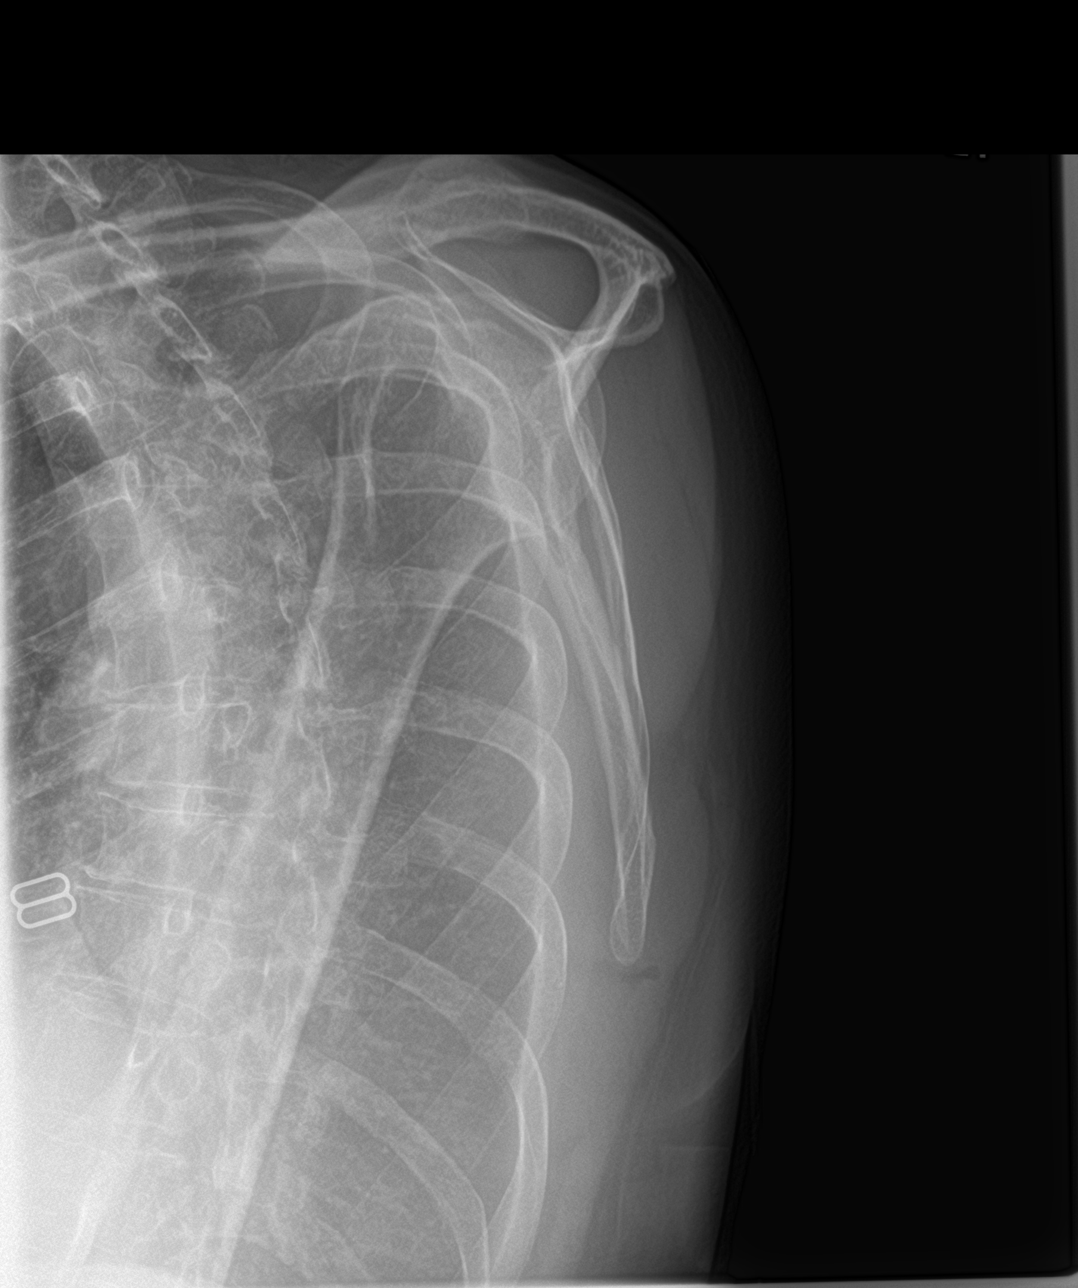
[im 3/3]
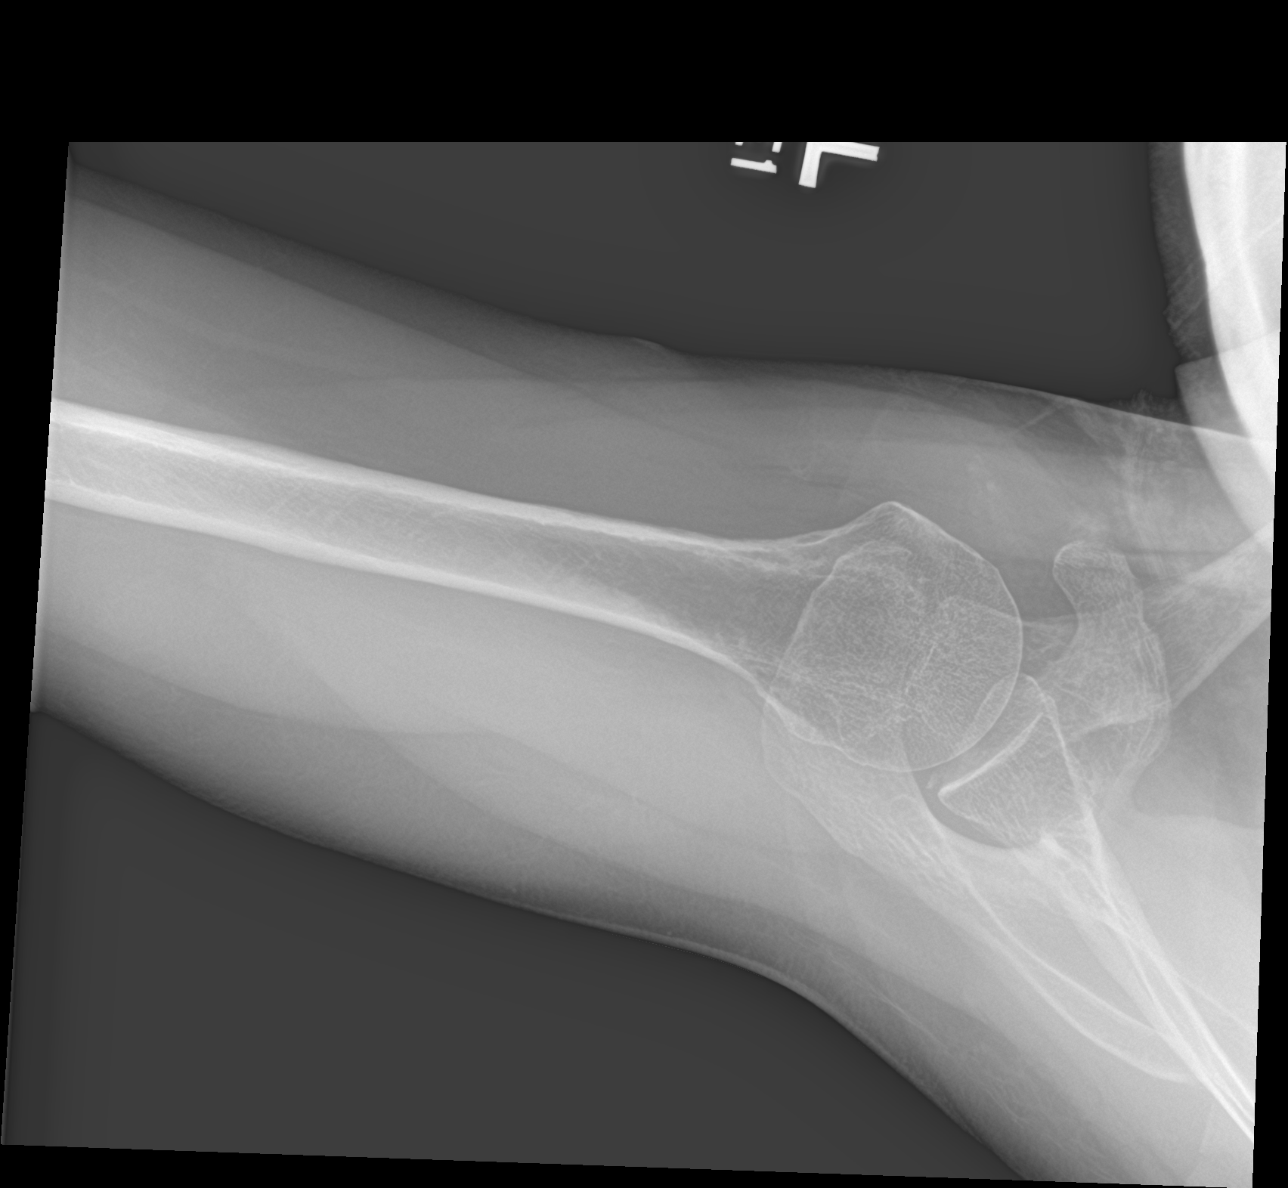

[3 of 3 positions shown; findings below may reference images not displayed]

FINDINGS: There is a small minimally displaced fracture of the posterior bony
glenoid. No other acute fracture. Partially visualized apparent
periosteal elevation of the humeral diaphysis, likely related to a
subacute or old fracture. Clinical correlation is recommended.
Dedicated radiograph of the left humerus may provide better
evaluation. The bones are mildly osteopenic. There is no
dislocation. The soft tissues are unremarkable.
IMPRESSION: Small minimally displaced fracture of the posterior bony glenoid. No
dislocation.

## 2022-10-10 IMAGING — CT CT CERVICAL SPINE W/O CM
3 of 4 series · 12 of 33 positions shown, 14 images · non-contrast
Comparison: None.

CLINICAL DATA: Facial trauma

EXAM:
CT HEAD WITHOUT CONTRAST
CT MAXILLOFACIAL WITHOUT CONTRAST
CT CERVICAL SPINE WITHOUT CONTRAST
TECHNIQUE: Multidetector CT imaging of the head, cervical spine, and
maxillofacial structures were performed using the standard protocol
without intravenous contrast. Multiplanar CT image reconstructions
of the cervical spine and maxillofacial structures were also
generated.

[Series 4: sagittal bone · sagittal · 0.26mm/px · 5 of 56 slices shown, 6 images]
[im 19/56  bone]
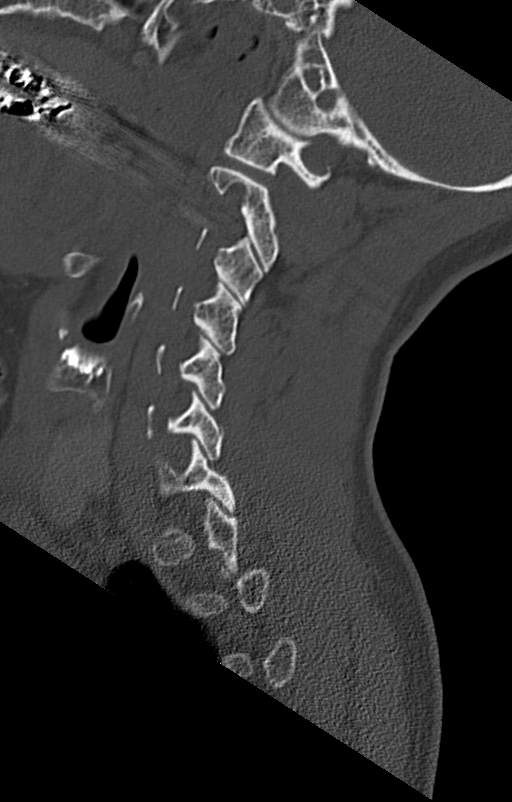
[im 23/56  bone]
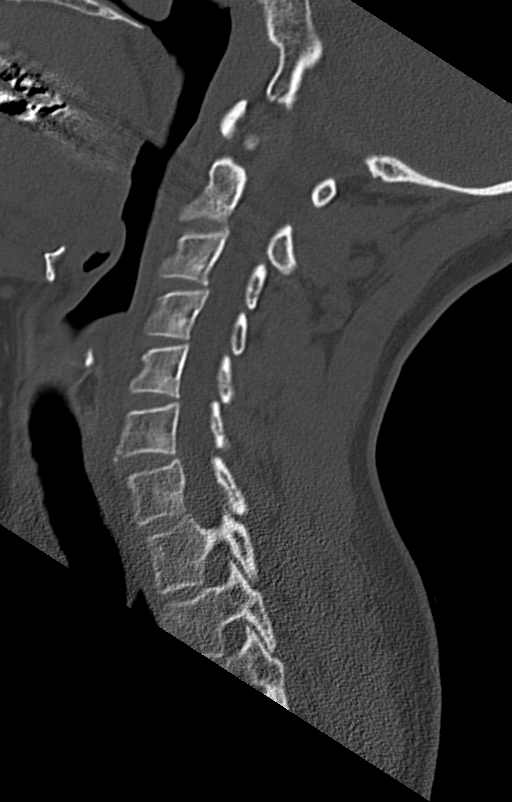
[im 28/56  soft-tissue]
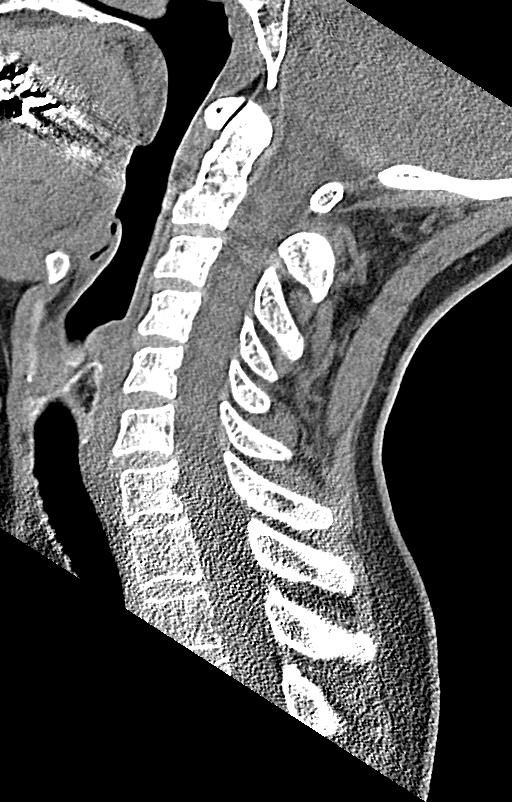
[im 28/56  bone]
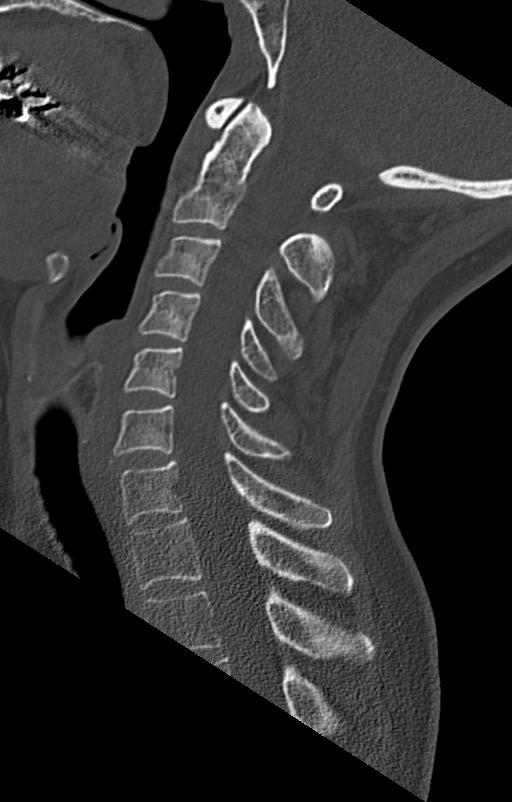
[im 33/56  bone]
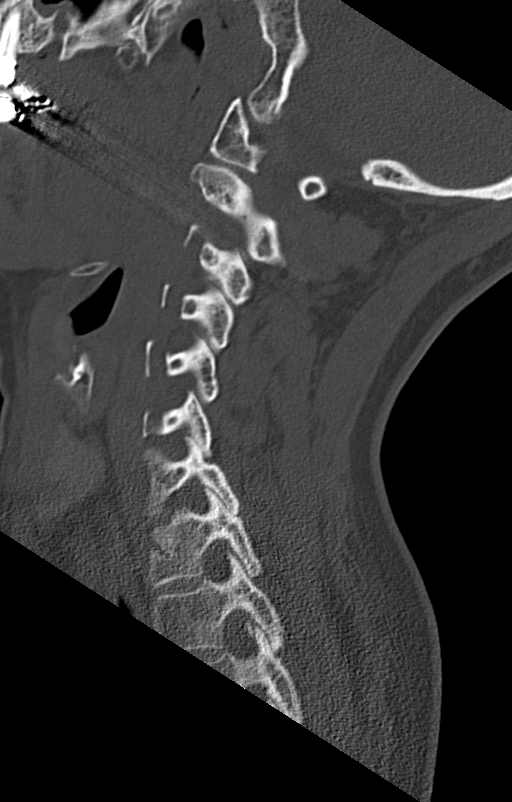
[im 37/56  bone]
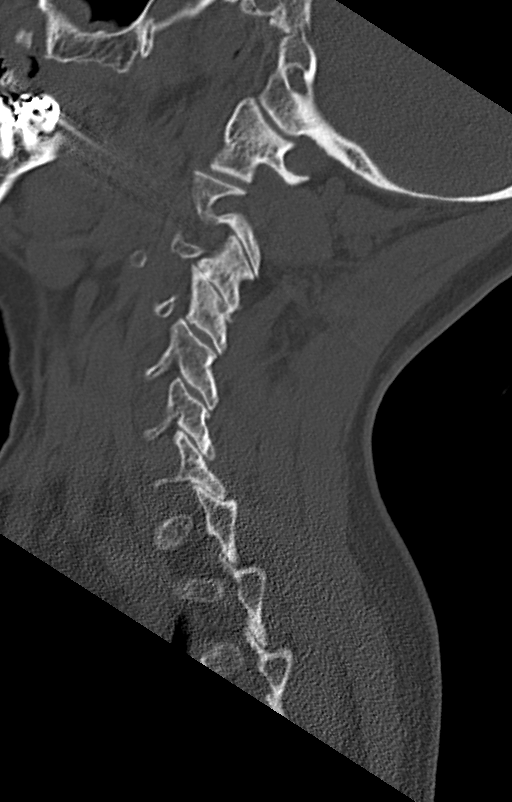

[Series 5: coronal bone · coronal · 0.27mm/px · 3 of 58 slices shown]
[im 14/58  bone]
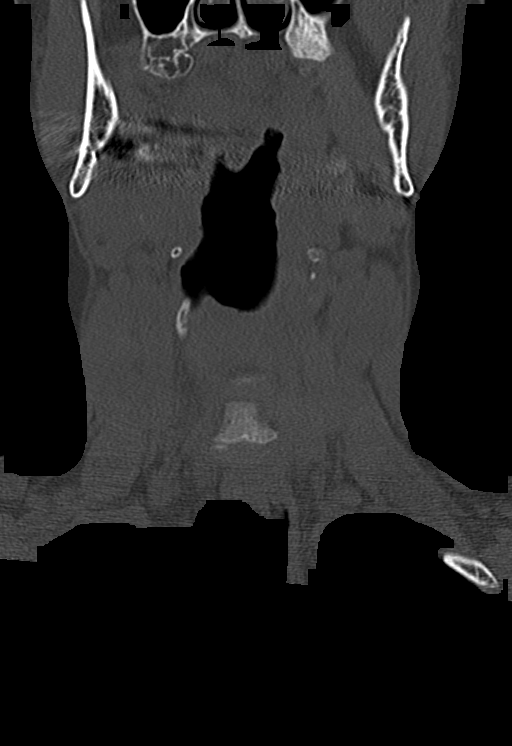
[im 24/58  bone]
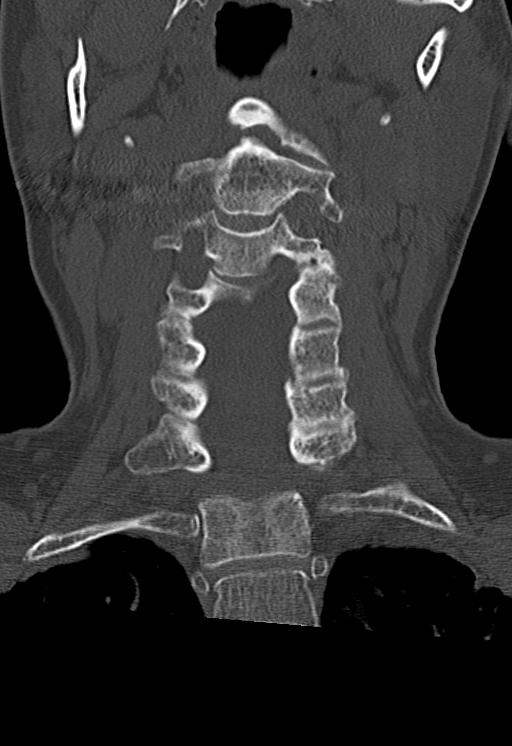
[im 34/58  bone]
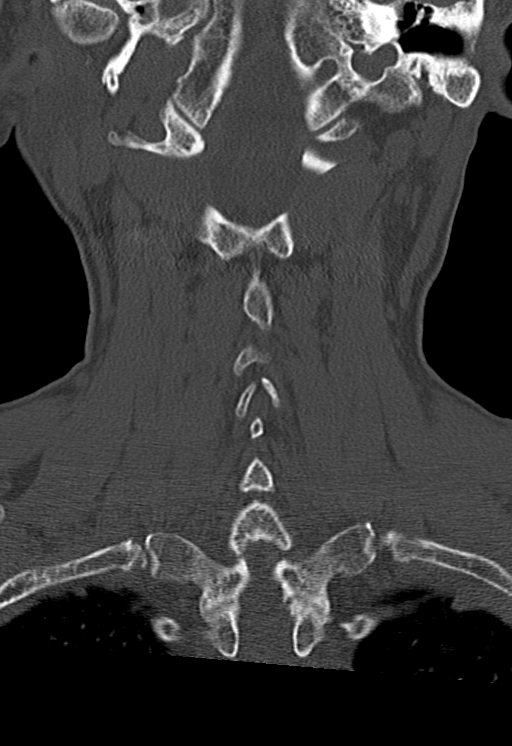

[Series 6: orthogonal axials · axial · 0.25mm/px · z∈[+255,+374]mm · 4 of 103 slices shown, 5 images]
[im 15/103  soft-tissue]
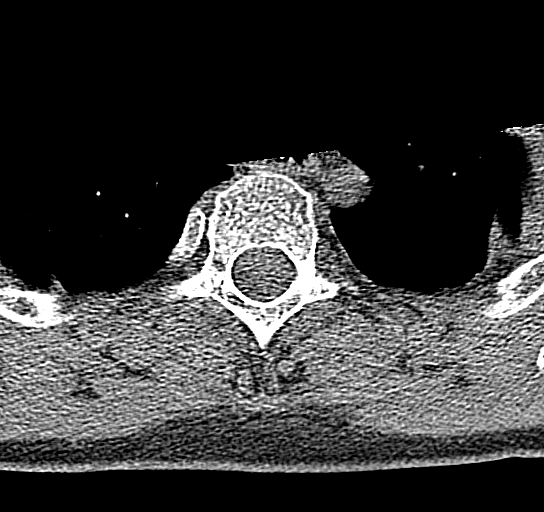
[im 15/103  bone]
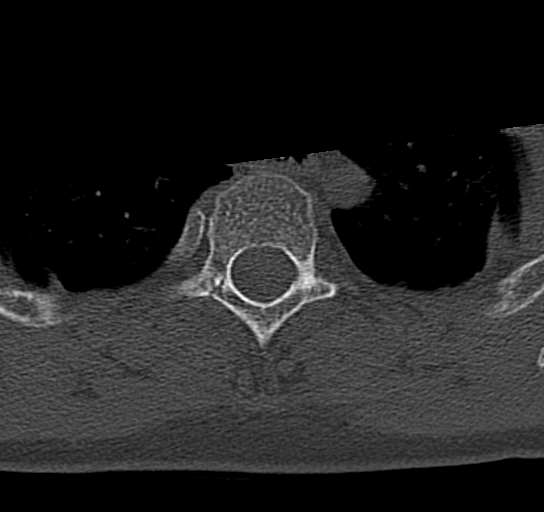
[im 44/103  bone]
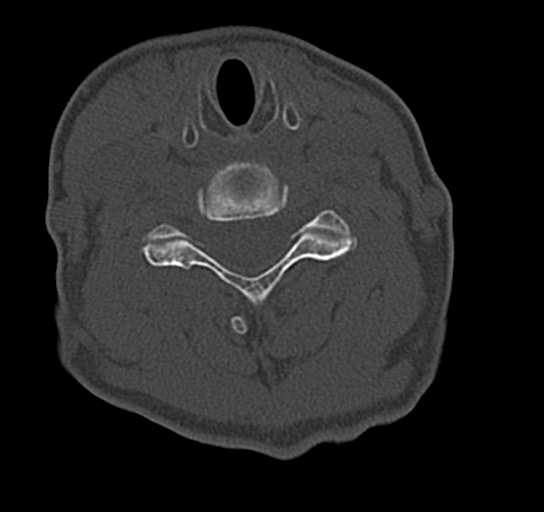
[im 59/103  bone]
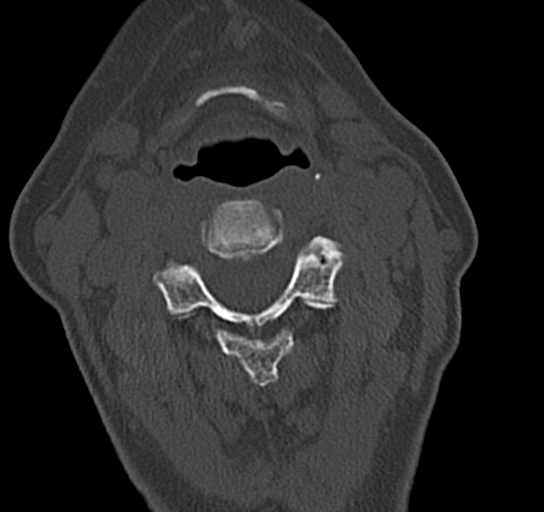
[im 88/103  bone]
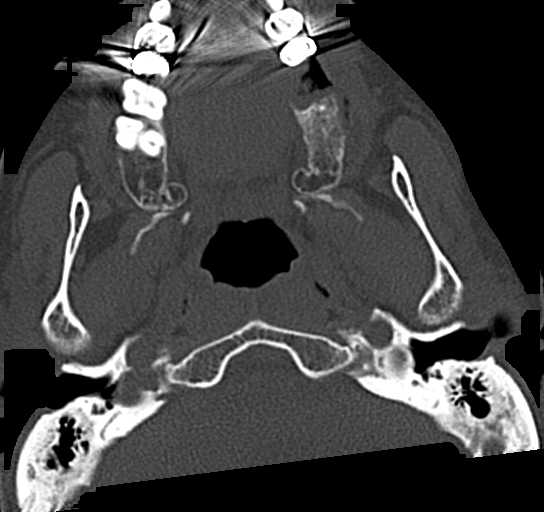

[12 of 33 positions shown; findings below may reference images not displayed]

FINDINGS: CT HEAD FINDINGS

Brain: No evidence of acute infarction, hemorrhage, hydrocephalus,
extra-axial collection or mass lesion/mass effect.

Vascular: No hyperdense vessel or unexpected calcification.

Skull: Normal. Negative for fracture or focal lesion.

Other: None.

CT MAXILLOFACIAL FINDINGS

Osseous: No fracture or mandibular dislocation. No destructive
process.

Orbits: Negative. No traumatic or inflammatory finding.

Sinuses: Minimal paranasal sinus mucosal thickening.

Soft tissues: Negative.

CT CERVICAL SPINE FINDINGS

Alignment: Normal.

Skull base and vertebrae: No acute fracture. No primary bone lesion
or focal pathologic process.

Soft tissues and spinal canal: No prevertebral fluid or swelling. No
visible canal hematoma.

Disc levels:  Minimal multilevel degenerative disc changes.

Upper chest: Negative.

Other: None
IMPRESSION: No acute intracranial abnormality.  No acute facial fracture.

No acute cervical spine fracture.

## 2022-10-10 MED ORDER — CIPROFLOXACIN HCL 500 MG PO TABS: 250.0000 mg | ORAL_TABLET | Freq: Two times a day (BID) | ORAL | 0 refills | Status: AC

## 2022-10-10 MED ORDER — ONDANSETRON HCL 4 MG PO TABS: 4.0000 mg | ORAL_TABLET | Freq: Three times a day (TID) | ORAL | 0 refills | Status: DC | PRN

## 2022-10-10 NOTE — Assessment & Plan Note (Signed)
Cerumen is adherent to TM.  Advised to see ENT for treatment

## 2022-10-10 NOTE — Assessment & Plan Note (Addendum)
TSH was normal April 2023 but she has had weight gain and constipation.   Repeat TSH is also normal  Lab Results  Component Value Date   TSH 3.78 10/10/2022

## 2022-10-10 NOTE — Progress Notes (Unsigned)
Subjective:  Patient ID: Cynthia Ruiz, female    DOB: 1965/05/03  Age: 57 y.o. MRN: [id number]  CC: The primary encounter diagnosis was Dysuria. Diagnoses of Overweight, Subclinical hypothyroidism, Prediabetes, Cystitis, Witnessed apneic spells, Sciatica associated with disorder of lumbosacral spine, and Impacted cerumen of right ear were also pertinent to this visit.   HPI Cynthia Ruiz presents for  Chief Complaint  Patient presents with   Dysuria   Ear Pain   LAST SEEN AUGUST 2023   1) TREATED FOR CYSTITIS WHICH PRESENTED WITH HEMATURIA AND SUPRAPUBIC PRESSURE,  OCCURRED  DURING [place] VACATION BY UNCLE EMPIRICALLY.   TOOK CEFACLOR 500 MG BID X 4 DAYS   FINISHED ON JUNE 19  NOW WITH  RIGHT SIDED BACK PAIN . HOME TEST WAS POSITIVE A FEW DAYS AGO .  NO FEVERS OR NAUSEA   2( EAR PAIN COMES AND GOES  RIGHT INNER  EAR. OCCURRED SPONTANEOUSLY     Outpatient Medications Prior to Visit  Medication Sig Dispense Refill   fluticasone (FLONASE) 50 MCG/ACT nasal spray Place 2 sprays into both nostrils daily. 16 g 6   Multiple Vitamins-Minerals (MULTIVITAMIN WOMEN PO) Take by mouth daily.     ALPRAZolam (XANAX) 0.25 MG tablet Take 1 tablet (0.25 mg total) by mouth 2 (two) times daily as needed for anxiety. 20 tablet 0   linaclotide (LINZESS) 145 MCG CAPS capsule Take 1 tablet by mouth daily.     PARoxetine (PAXIL CR) 12.5 MG 24 hr tablet Take 1 tablet (12.5 mg total) by mouth daily. 90 tablet 1   polyethylene glycol-electrolytes (NULYTELY) 420 g solution PLEASE SEE ATTACHED FOR DETAILED DIRECTIONS     No facility-administered medications prior to visit.    Review of Systems;  Patient denies headache, fevers, malaise, unintentional weight loss, skin rash, eye pain, sinus congestion and sinus pain, sore throat, dysphagia,  hemoptysis , cough, dyspnea, wheezing, chest pain, palpitations, orthopnea, edema, abdominal pain, nausea, melena, diarrhea, constipation, flank pain, dysuria, hematuria,  urinary  Frequency, nocturia, numbness, tingling, seizures,  Focal weakness, Loss of consciousness,  Tremor, insomnia, depression, anxiety, and suicidal ideation.      Objective:  BP 138/82   Pulse 83   Temp 98.1 F (36.7 C) (Oral)   Ht 5\' 3"  (1.6 m)   Wt 160 lb 12.8 oz (72.9 kg)   LMP 01/13/2015 (Approximate)   SpO2 99%   BMI 28.48 kg/m   BP Readings from Last 3 Encounters:  10/10/22 138/82  03/13/22 135/84  12/06/21 118/78    Wt Readings from Last 3 Encounters:  10/10/22 160 lb 12.8 oz (72.9 kg)  12/06/21 150 lb 9.6 oz (68.3 kg)  08/30/21 154 lb 3.2 oz (69.9 kg)    Physical Exam Vitals reviewed.  Constitutional:      General: She is not in acute distress.    Appearance: Normal appearance. She is normal weight. She is not ill-appearing, toxic-appearing or diaphoretic.  HENT:     Head: Normocephalic.     Right Ear: There is impacted cerumen.     Left Ear: There is no impacted cerumen.  Eyes:     General: No scleral icterus.       Right eye: No discharge.        Left eye: No discharge.     Conjunctiva/sclera: Conjunctivae normal.  Cardiovascular:     Rate and Rhythm: Normal rate and regular rhythm.     Heart sounds: Normal heart sounds.  Pulmonary:     Effort:  Pulmonary effort is normal. No respiratory distress.     Breath sounds: Normal breath sounds.  Abdominal:     Palpations: Abdomen is soft. There is no mass.     Tenderness: There is no abdominal tenderness. There is right CVA tenderness. There is no guarding.  Musculoskeletal:        General: Normal range of motion.  Skin:    General: Skin is warm and dry.  Neurological:     General: No focal deficit present.     Mental Status: She is alert and oriented to person, place, and time. Mental status is at baseline.  Psychiatric:        Mood and Affect: Mood normal.        Behavior: Behavior normal.        Thought Content: Thought content normal.        Judgment: Judgment normal.     Lab Results   Component Value Date   HGBA1C 5.9 12/06/2021   HGBA1C 5.5 03/29/2018   HGBA1C 5.6 03/12/2017    Lab Results  Component Value Date   CREATININE 0.85 12/06/2021   CREATININE 0.81 08/02/2021   CREATININE 0.91 05/13/2021    Lab Results  Component Value Date   WBC 4.8 05/13/2021   HGB 12.3 05/13/2021   HCT 37.1 05/13/2021   PLT 272.0 05/13/2021   GLUCOSE 79 12/06/2021   CHOL 144 12/06/2021   TRIG 93.0 12/06/2021   HDL 55.00 12/06/2021   LDLDIRECT 65.0 12/06/2021   LDLCALC 71 12/06/2021   ALT 10 12/06/2021   AST 18 12/06/2021   NA 136 12/06/2021   K 4.3 12/06/2021   CL 104 12/06/2021   CREATININE 0.85 12/06/2021   BUN 16 12/06/2021   CO2 28 12/06/2021   TSH 3.50 08/02/2021   HGBA1C 5.9 12/06/2021   MICROALBUR <0.7 02/11/2016    No results found.  Assessment & Plan:  .Dysuria -     POCT urinalysis dipstick -     Urine Culture -     Urine Microscopic -     CBC with Differential/Platelet  Overweight -     Comprehensive metabolic panel  Subclinical hypothyroidism Assessment & Plan: TSH was normal April 2023 but she has had weight gain and constipation.   rechecking  Orders: -     TSH  Prediabetes -     Hemoglobin A1c  Cystitis Assessment & Plan: She was treated empirically 2 weeks ago with an incomplete course and symptoms have not resolved.  Repeat UAmicr and culture ; cipro 500 mg dose sent to pharmacy to use prior to culture results IF symptoms worsen   Witnessed apneic spells Assessment & Plan: Home sleep test was ordered in April; but not done.     Sciatica associated with disorder of lumbosacral spine Assessment & Plan: Unclear if current pain is due to pyelonephritis vs chronic pain .  She is well appearing and afebrile.  UA is normal    Impacted cerumen of right ear Assessment & Plan: Advised to see ENT for treatment    Other orders -     Ciprofloxacin HCl; Take 0.5 tablets (250 mg total) by mouth 2 (two) times daily for 7 days.   Dispense: 7 tablet; Refill: 0 -     Ondansetron HCl; Take 1 tablet (4 mg total) by mouth every 8 (eight) hours as needed for nausea or vomiting.  Dispense: 20 tablet; Refill: 0     I provided 30 minutes of face-to-face time during this  encounter reviewing patient's last visit with me, patient's  most recent visit with cardiology,  nephrology,  and neurology,  recent surgical and non surgical procedures, previous  labs and imaging studies, counseling on currently addressed issues,  and post visit ordering to diagnostics and therapeutics .   Follow-up: No follow-ups on file.   Sherlene Shams, MD

## 2022-10-10 NOTE — Assessment & Plan Note (Addendum)
Home sleep test was ordered in April; but not done.

## 2022-10-10 NOTE — Assessment & Plan Note (Signed)
Unclear if current pain is due to pyelonephritis vs chronic pain .  She is well appearing and afebrile.  UA is normal

## 2022-10-10 NOTE — Assessment & Plan Note (Signed)
She was treated empirically 2 weeks ago with an incomplete course of Cefaclor and symptoms have not resolved.  Repeat UA with micr and culture ; cipro 500 mg dose was sent to pharmacy to use prior to culture results IF symptoms worsen. However her urine culture was negative for pathogenic bacteria

## 2022-10-10 NOTE — Patient Instructions (Addendum)
YOUR RIGHT EAR IS OCCLUDED BY WAX, but there is no sign of inflammation   START BENEFIBER (CONTAIN PREBIOTICS)   for constipation and healthy bowels    THE urine CULTURE WILL TAKE 2-3 DAYS TO RESULT.  IF SYMPTOMS  WORSEN (FEVER,  BLOOD  IN YOUR URINE,  BELLY PAIN),,  START THE CIPRO

## 2022-10-11 LAB — COMPREHENSIVE METABOLIC PANEL
AG Ratio: 1.5 (calc) (ref 1.0–2.5)
ALT: 10 U/L (ref 6–29)
AST: 16 U/L (ref 10–35)
Albumin: 4.4 g/dL (ref 3.6–5.1)
Alkaline phosphatase (APISO): 74 U/L (ref 37–153)
BUN: 18 mg/dL (ref 7–25)
CO2: 24 mmol/L (ref 20–32)
Calcium: 9.4 mg/dL (ref 8.6–10.4)
Chloride: 104 mmol/L (ref 98–110)
Creat: 0.82 mg/dL (ref 0.50–1.03)
Globulin: 2.9 g/dL (calc) (ref 1.9–3.7)
Glucose, Bld: 85 mg/dL (ref 65–99)
Potassium: 4.7 mmol/L (ref 3.5–5.3)
Sodium: 138 mmol/L (ref 135–146)
Total Bilirubin: 0.3 mg/dL (ref 0.2–1.2)
Total Protein: 7.3 g/dL (ref 6.1–8.1)

## 2022-10-11 LAB — URINALYSIS, MICROSCOPIC ONLY
Bacteria, UA: NONE SEEN /HPF
Hyaline Cast: NONE SEEN /LPF
Squamous Epithelial / HPF: NONE SEEN /HPF (ref <=5)
WBC, UA: NONE SEEN /HPF (ref 0–5)

## 2022-10-11 LAB — HEMOGLOBIN A1C
Hgb A1c MFr Bld: 5.7 % of total Hgb — ABNORMAL HIGH (ref <5.7)
Mean Plasma Glucose: 117 mg/dL
eAG (mmol/L): 6.5 mmol/L

## 2022-10-11 LAB — CBC WITH DIFFERENTIAL/PLATELET
Absolute Monocytes: 611 cells/uL (ref 200–950)
Basophils Relative: 0.6 %
HCT: 36.9 % (ref 35.0–45.0)
Hemoglobin: 12.2 g/dL (ref 11.7–15.5)
Lymphs Abs: 1749 cells/uL (ref 850–3900)
MCH: 29 pg (ref 27.0–33.0)
Monocytes Relative: 9.4 %
Platelets: 307 10*3/uL (ref 140–400)
RBC: 4.2 10*6/uL (ref 3.80–5.10)
Total Lymphocyte: 26.9 %
WBC: 6.5 10*3/uL (ref 3.8–10.8)

## 2022-10-11 LAB — URINE CULTURE
MICRO NUMBER:: 15140725
SPECIMEN QUALITY:: ADEQUATE

## 2022-10-11 LAB — TSH: TSH: 3.78 mIU/L (ref 0.40–4.50)

## 2022-10-12 NOTE — Assessment & Plan Note (Signed)
I have addressed  BMI and recommended a low glycemic index diet utilizing smaller more frequent meals to increase metabolism.  I have also recommended that patient start exercising with a goal of 30 minutes of aerobic exercise a minimum of 5 days per week.  

## 2022-10-12 NOTE — Assessment & Plan Note (Addendum)
Quite large,  By August 09 2021 colonoscopy.  She was  scheduled for a banding procedure   but deferred

## 2022-11-17 DIAGNOSIS — H906 Mixed conductive and sensorineural hearing loss, bilateral: Secondary | ICD-10-CM | POA: Diagnosis not present

## 2022-11-17 DIAGNOSIS — H6983 Other specified disorders of Eustachian tube, bilateral: Secondary | ICD-10-CM | POA: Diagnosis not present

## 2022-11-17 DIAGNOSIS — H6981 Other specified disorders of Eustachian tube, right ear: Secondary | ICD-10-CM | POA: Diagnosis not present

## 2023-01-15 ENCOUNTER — Other Ambulatory Visit: Payer: Self-pay | Admitting: Internal Medicine

## 2023-01-15 DIAGNOSIS — Z1231 Encounter for screening mammogram for malignant neoplasm of breast: Secondary | ICD-10-CM

## 2023-01-21 ENCOUNTER — Encounter: Payer: Self-pay | Admitting: Internal Medicine

## 2023-01-21 ENCOUNTER — Ambulatory Visit: Payer: BC Managed Care – PPO | Attending: Internal Medicine

## 2023-01-21 ENCOUNTER — Ambulatory Visit: Payer: BC Managed Care – PPO | Admitting: Internal Medicine

## 2023-01-21 VITALS — BP 118/78 | HR 91 | Ht 63.0 in | Wt 160.8 lb

## 2023-01-21 DIAGNOSIS — R5383 Other fatigue: Secondary | ICD-10-CM | POA: Diagnosis not present

## 2023-01-21 DIAGNOSIS — I471 Supraventricular tachycardia, unspecified: Secondary | ICD-10-CM | POA: Diagnosis not present

## 2023-01-21 DIAGNOSIS — R002 Palpitations: Secondary | ICD-10-CM

## 2023-01-21 DIAGNOSIS — I517 Cardiomegaly: Secondary | ICD-10-CM

## 2023-01-21 DIAGNOSIS — Z23 Encounter for immunization: Secondary | ICD-10-CM | POA: Diagnosis not present

## 2023-01-21 LAB — COMPREHENSIVE METABOLIC PANEL
ALT: 13 U/L (ref 0–35)
AST: 19 U/L (ref 0–37)
Albumin: 4.3 g/dL (ref 3.5–5.2)
Alkaline Phosphatase: 75 U/L (ref 39–117)
BUN: 12 mg/dL (ref 6–23)
CO2: 28 meq/L (ref 19–32)
Calcium: 9.4 mg/dL (ref 8.4–10.5)
Chloride: 102 meq/L (ref 96–112)
Creatinine, Ser: 0.78 mg/dL (ref 0.40–1.20)
GFR: 83.31 mL/min (ref >60.00)
Glucose, Bld: 95 mg/dL (ref 70–99)
Potassium: 4.3 meq/L (ref 3.5–5.1)
Sodium: 137 meq/L (ref 135–145)
Total Bilirubin: 0.3 mg/dL (ref 0.2–1.2)
Total Protein: 7.3 g/dL (ref 6.0–8.3)

## 2023-01-21 LAB — CBC WITH DIFFERENTIAL/PLATELET
Basophils Absolute: 0 10*3/uL (ref 0.0–0.1)
Basophils Relative: 0.4 % (ref 0.0–3.0)
Eosinophils Absolute: 0.1 10*3/uL (ref 0.0–0.7)
Eosinophils Relative: 1.7 % (ref 0.0–5.0)
HCT: 39.5 % (ref 36.0–46.0)
Hemoglobin: 12.8 g/dL (ref 12.0–15.0)
Lymphocytes Relative: 29 % (ref 12.0–46.0)
Lymphs Abs: 1.9 10*3/uL (ref 0.7–4.0)
MCHC: 32.5 g/dL (ref 30.0–36.0)
MCV: 88.7 fL (ref 78.0–100.0)
Monocytes Absolute: 0.7 10*3/uL (ref 0.1–1.0)
Monocytes Relative: 10.9 % (ref 3.0–12.0)
Neutro Abs: 3.7 10*3/uL (ref 1.4–7.7)
Neutrophils Relative %: 58 % (ref 43.0–77.0)
Platelets: 327 10*3/uL (ref 150.0–400.0)
RBC: 4.45 Mil/uL (ref 3.87–5.11)
RDW: 13.2 % (ref 11.5–15.5)
WBC: 6.5 10*3/uL (ref 4.0–10.5)

## 2023-01-21 LAB — TSH: TSH: 4.37 u[IU]/mL (ref 0.35–5.50)

## 2023-01-21 LAB — MAGNESIUM: Magnesium: 2 mg/dL (ref 1.5–2.5)

## 2023-01-21 MED ORDER — OMEPRAZOLE 40 MG PO CPDR: 40.0000 mg | DELAYED_RELEASE_CAPSULE | Freq: Every day | ORAL | 3 refills | Status: DC

## 2023-01-21 MED ORDER — PROPRANOLOL HCL 10 MG PO TABS
10.0000 mg | ORAL_TABLET | Freq: Three times a day (TID) | ORAL | 0 refills | Status: DC
Start: 2023-01-21 — End: 2023-10-26

## 2023-01-21 NOTE — Assessment & Plan Note (Signed)
Due to daughter with [facility] and husband who does not help

## 2023-01-21 NOTE — Assessment & Plan Note (Signed)
Recurrent ,  two episodes in September.  I have ordered and reviewed a 12 lead EKG and find that there are no acute changes and patient is in sinus rhythm.   However she has LVH.  ECHO and ZIO monitor ordered

## 2023-01-21 NOTE — Progress Notes (Unsigned)
Subjective:  Patient ID: [name], female    DOB: 1965/12/23  Age: 57 y.o. MRN: [id number]  CC: The primary encounter diagnosis was Palpitations. Diagnoses of Paroxysmal supraventricular tachycardia (HCC), LVH (left ventricular hypertrophy), Caregiver with fatigue, and Need for influenza vaccination were also pertinent to this visit.   HPI KINA SHIFFMAN presents for  Chief Complaint  Patient presents with   Palpitations   57 yr old female with history of PSVT presents with recurrent episodes   Spent 10 days in [place] in September, 8 to 18,  middle daughter staying in [place] to study.  During the flight back she developed heart racing without shortness of breath ,  chest pain . Managed it with deep breathin g.  2nd episode woke her up a week later and lasted several minutes.  Reviewed caffeine intake:  averages 1-2 cups of coffee in the AM or herbal tea   She remains stressed out physically and emotionally by the responsibilities of caring for her daughter who has Down's syndrome and has been increasingly defiant. At times her daughteris p hysically combative     Outpatient Medications Prior to Visit  Medication Sig Dispense Refill   fluticasone (FLONASE) 50 MCG/ACT nasal spray Place 2 sprays into both nostrils daily. 16 g 6   Multiple Vitamins-Minerals (MULTIVITAMIN WOMEN PO) Take by mouth daily.     ondansetron (ZOFRAN) 4 MG tablet Take 1 tablet (4 mg total) by mouth every 8 (eight) hours as needed for nausea or vomiting. 20 tablet 0   No facility-administered medications prior to visit.    Review of Systems;  Patient denies headache, fevers, malaise, unintentional weight loss, skin rash, eye pain, sinus congestion and sinus pain, sore throat, dysphagia,  hemoptysis , cough, dyspnea, wheezing, chest pain, palpitations, orthopnea, edema, abdominal pain, nausea, melena, diarrhea, constipation, flank pain, dysuria, hematuria, urinary  Frequency, nocturia, numbness, tingling,  seizures,  Focal weakness, Loss of consciousness,  Tremor, insomnia, depression, anxiety, and suicidal ideation.      Objective:  BP 118/78   Pulse 91   Ht 5\' 3"  (1.6 m)   Wt 160 lb 12.8 oz (72.9 kg)   LMP 01/13/2015 (Approximate)   SpO2 98%   BMI 28.48 kg/m   BP Readings from Last 3 Encounters:  01/21/23 118/78  10/10/22 138/82  03/13/22 135/84    Wt Readings from Last 3 Encounters:  01/21/23 160 lb 12.8 oz (72.9 kg)  10/10/22 160 lb 12.8 oz (72.9 kg)  12/06/21 150 lb 9.6 oz (68.3 kg)    Physical Exam Vitals reviewed.  Constitutional:      General: She is not in acute distress.    Appearance: Normal appearance. She is normal weight. She is not ill-appearing, toxic-appearing or diaphoretic.  HENT:     Head: Normocephalic.  Eyes:     General: No scleral icterus.       Right eye: No discharge.        Left eye: No discharge.     Conjunctiva/sclera: Conjunctivae normal.  Cardiovascular:     Rate and Rhythm: Normal rate and regular rhythm.     Heart sounds: Normal heart sounds.  Pulmonary:     Effort: Pulmonary effort is normal. No respiratory distress.     Breath sounds: Normal breath sounds.  Musculoskeletal:        General: Normal range of motion.  Skin:    General: Skin is warm and dry.  Neurological:     General: No focal deficit present.  Mental Status: She is alert and oriented to person, place, and time. Mental status is at baseline.  Psychiatric:        Mood and Affect: Mood normal.        Behavior: Behavior normal.        Thought Content: Thought content normal.        Judgment: Judgment normal.    Lab Results  Component Value Date   HGBA1C 5.7 (H) 10/10/2022   HGBA1C 5.9 12/06/2021   HGBA1C 5.5 03/29/2018    Lab Results  Component Value Date   CREATININE 0.78 01/21/2023   CREATININE 0.82 10/10/2022   CREATININE 0.85 12/06/2021    Lab Results  Component Value Date   WBC 6.5 01/21/2023   HGB 12.8 01/21/2023   HCT 39.5 01/21/2023    PLT 327.0 01/21/2023   GLUCOSE 95 01/21/2023   CHOL 144 12/06/2021   TRIG 93.0 12/06/2021   HDL 55.00 12/06/2021   LDLDIRECT 65.0 12/06/2021   LDLCALC 71 12/06/2021   ALT 13 01/21/2023   AST 19 01/21/2023   NA 137 01/21/2023   K 4.3 01/21/2023   CL 102 01/21/2023   CREATININE 0.78 01/21/2023   BUN 12 01/21/2023   CO2 28 01/21/2023   TSH 4.37 01/21/2023   HGBA1C 5.7 (H) 10/10/2022   MICROALBUR <0.7 02/11/2016    No results found.  Assessment & Plan:  .Palpitations -     EKG 12-Lead -     LONG TERM MONITOR (3-14 DAYS); Future -     ECHOCARDIOGRAM COMPLETE; Future  Paroxysmal supraventricular tachycardia [facility]) Assessment & Plan: Recurrent ,  two episodes in September.  I have ordered and reviewed a 12 lead EKG and find that there are no acute changes and patient is in sinus rhythm.   However she has LVH.  ECHO and ZIO monitor ordered   Orders: -     LONG TERM MONITOR (3-14 DAYS); Future -     TSH -     Magnesium -     Comprehensive metabolic panel -     CBC with Differential/Platelet  LVH (left ventricular hypertrophy) -     ECHOCARDIOGRAM COMPLETE; Future  Caregiver with fatigue Assessment & Plan: Due to daughter with Down's Syndrom and husband who does not help    Need for influenza vaccination -     Flu vaccine trivalent PF, 6mos and older(Flulaval,Afluria,Fluarix,Fluzone)  Other orders -     Omeprazole; Take 1 capsule (40 mg total) by mouth daily.  Dispense: 30 capsule; Refill: 3 -     Propranolol HCl; Take 1 tablet (10 mg total) by mouth 3 (three) times daily. As needed for rapid heart rate  Dispense: 30 tablet; Refill: 0     I provided 30 minutes of face-to-face time during this encounter reviewing patient's last visit with me, patient's  most recent visit with cardiology,  nephrology,  and neurology,  recent surgical and non surgical procedures, previous  labs and imaging studies, counseling on currently addressed issues,  and post visit ordering to  diagnostics and therapeutics .   Follow-up: No follow-ups on file.   Sherlene Shams, MD

## 2023-01-21 NOTE — Patient Instructions (Signed)
Omeprazole 40 mg daily or prn reflux   Propranolol 10 mg : use for episodes of palpitations lasting 15 minutes or longer   I am ordering a ZIO monitor for you to wear for 2 weeks to monitor your heart rhythms .  It will come to your house and you can apply it to your chest wall.  If you have any trouble applying it you can schedule a RN visit in our office and we will help you   I am advising that you have an echocardiogram. Echocardiography is a painless test that uses sound waves to create images of your heart. It provides [place] with information about the size and shape of your heart and how well your heart's chambers and valves are working. This procedure takes approximately one hour. There are no restrictions for this procedure.

## 2023-01-22 ENCOUNTER — Ambulatory Visit
Admission: RE | Admit: 2023-01-22 | Discharge: 2023-01-22 | Disposition: A | Discharge status: Home / Self Care | Payer: BC Managed Care – PPO | Source: Ambulatory Visit | Attending: Internal Medicine | Admitting: Internal Medicine

## 2023-01-22 DIAGNOSIS — Z1231 Encounter for screening mammogram for malignant neoplasm of breast: Secondary | ICD-10-CM | POA: Insufficient documentation

## 2023-01-22 DIAGNOSIS — R002 Palpitations: Secondary | ICD-10-CM | POA: Insufficient documentation

## 2023-01-22 NOTE — Assessment & Plan Note (Addendum)
Occurring spontaneously , accompanied by chest tightness .  I have ordered and reviewed a 12 lead EKG and find that there are no acute changes and patient is in sinus rhythm.   Thyroid, cBC and electrolytes are normal.    ZIO monitor needed to rule out PSVT/atrial fibrillation

## 2023-01-24 DIAGNOSIS — I471 Supraventricular tachycardia, unspecified: Secondary | ICD-10-CM

## 2023-01-24 DIAGNOSIS — R002 Palpitations: Secondary | ICD-10-CM

## 2023-02-03 ENCOUNTER — Encounter: Payer: Self-pay | Admitting: Internal Medicine

## 2023-02-03 NOTE — Telephone Encounter (Signed)
noted 

## 2023-02-10 ENCOUNTER — Ambulatory Visit: Payer: BC Managed Care – PPO | Admitting: Internal Medicine

## 2023-02-27 ENCOUNTER — Ambulatory Visit
Admission: RE | Admit: 2023-02-27 | Discharge: 2023-02-27 | Disposition: A | Discharge status: Home / Self Care | Payer: BC Managed Care – PPO | Source: Ambulatory Visit | Attending: Internal Medicine | Admitting: Internal Medicine

## 2023-02-27 DIAGNOSIS — R002 Palpitations: Secondary | ICD-10-CM

## 2023-02-27 DIAGNOSIS — R9431 Abnormal electrocardiogram [ECG] [EKG]: Secondary | ICD-10-CM | POA: Insufficient documentation

## 2023-02-27 DIAGNOSIS — F419 Anxiety disorder, unspecified: Secondary | ICD-10-CM | POA: Insufficient documentation

## 2023-02-27 DIAGNOSIS — I517 Cardiomegaly: Secondary | ICD-10-CM

## 2023-02-27 LAB — ECHOCARDIOGRAM COMPLETE
AR max vel: 2.62 cm2
AV Area VTI: 2.75 cm2
AV Area mean vel: 2.57 cm2
AV Mean grad: 3 mm[Hg]
AV Peak grad: 4.8 mm[Hg]
Ao pk vel: 1.09 m/s
Area-P 1/2: 7.22 cm2
S' Lateral: 3.3 cm

## 2023-03-26 ENCOUNTER — Other Ambulatory Visit: Payer: Self-pay | Admitting: Internal Medicine

## 2023-03-26 ENCOUNTER — Ambulatory Visit: Payer: BC Managed Care – PPO | Admitting: Internal Medicine

## 2023-03-26 ENCOUNTER — Encounter: Payer: Self-pay | Admitting: Internal Medicine

## 2023-03-26 ENCOUNTER — Ambulatory Visit: Payer: BC Managed Care – PPO

## 2023-03-26 VITALS — BP 126/80 | HR 94 | Ht 63.0 in | Wt 164.4 lb

## 2023-03-26 DIAGNOSIS — M5387 Other specified dorsopathies, lumbosacral region: Secondary | ICD-10-CM

## 2023-03-26 DIAGNOSIS — M5432 Sciatica, left side: Secondary | ICD-10-CM | POA: Diagnosis not present

## 2023-03-26 DIAGNOSIS — L03011 Cellulitis of right finger: Secondary | ICD-10-CM | POA: Diagnosis not present

## 2023-03-26 DIAGNOSIS — I471 Supraventricular tachycardia, unspecified: Secondary | ICD-10-CM | POA: Diagnosis not present

## 2023-03-26 DIAGNOSIS — M549 Dorsalgia, unspecified: Secondary | ICD-10-CM | POA: Diagnosis not present

## 2023-03-26 DIAGNOSIS — M5431 Sciatica, right side: Secondary | ICD-10-CM | POA: Diagnosis not present

## 2023-03-26 MED ORDER — METHYLPREDNISOLONE ACETATE 80 MG/ML IJ SUSP
80.0000 mg | Freq: Once | INTRAMUSCULAR | Status: DC
Start: 2023-03-26 — End: 2023-03-26

## 2023-03-26 MED ORDER — TIZANIDINE HCL 2 MG PO TABS
ORAL_TABLET | ORAL | 0 refills | Status: DC
Start: 2023-03-26 — End: 2023-03-29

## 2023-03-26 MED ORDER — CELECOXIB 200 MG PO CAPS: 200.0000 mg | ORAL_CAPSULE | Freq: Two times a day (BID) | ORAL | 0 refills | Status: DC

## 2023-03-26 MED ORDER — HYDROCODONE-ACETAMINOPHEN 10-325 MG PO TABS: 1.0000 | ORAL_TABLET | Freq: Four times a day (QID) | ORAL | 0 refills | Status: AC | PRN

## 2023-03-26 MED ORDER — CEPHALEXIN 500 MG PO CAPS: 500.0000 mg | ORAL_CAPSULE | Freq: Four times a day (QID) | ORAL | 0 refills | Status: DC

## 2023-03-26 MED ORDER — METHYLPREDNISOLONE ACETATE 40 MG/ML IJ SUSP
80.0000 mg | Freq: Once | INTRAMUSCULAR | Status: AC
  Administered 2023-03-26: 80 mg via INTRAMUSCULAR

## 2023-03-26 NOTE — Progress Notes (Signed)
Subjective:  Patient ID: Cynthia Ruiz, female    DOB: 1965/05/30  Age: 57 y.o. MRN: [id number]  CC: The primary encounter diagnosis was Sciatica associated with disorder of lumbosacral spine. Diagnoses of Paronychia of finger, right and Paroxysmal supraventricular tachycardia (HCC) were also pertinent to this visit.   HPI Cynthia Ruiz presents for  Chief Complaint  Patient presents with   back issues   Cynthia Ruiz is a 57 yr old female with a history of mild lumbar degenerative disk disease with facet arthropathy without spondylolisthesis  who presents with sciatica  Symptoms stated after washing her hair in the sink (prolonged flexing) on Sunday.  But became much  worse after activities on  Thursday :  she bent over and spent  a few hours kneeling on the floor setting up a manger scene.  Upon rising the pain worsened.  Still walking daily which helps.  Not sleeping due to pain with all positions and with tit.     Outpatient Medications Prior to Visit  Medication Sig Dispense Refill   omeprazole (PRILOSEC) 40 MG capsule Take 1 capsule (40 mg total) by mouth daily. 30 capsule 3   propranolol (INDERAL) 10 MG tablet Take 1 tablet (10 mg total) by mouth 3 (three) times daily. As needed for rapid heart rate 30 tablet 0   No facility-administered medications prior to visit.    Review of Systems;  Patient denies headache, fevers, malaise, unintentional weight loss, skin rash, eye pain, sinus congestion and sinus pain, sore throat, dysphagia,  hemoptysis , cough, dyspnea, wheezing, chest pain, palpitations, orthopnea, edema, abdominal pain, nausea, melena, diarrhea, constipation, flank pain, dysuria, hematuria, urinary  Frequency, nocturia, numbness, tingling, seizures,  Focal weakness, Loss of consciousness,  Tremor, insomnia, depression, anxiety, and suicidal ideation.      Objective:  BP 126/80   Pulse 94   Ht 5\' 3"  (1.6 m)   Wt 164 lb 6.4 oz (74.6 kg)   LMP 01/13/2015 (Approximate)    SpO2 99%   BMI 29.12 kg/m   BP Readings from Last 3 Encounters:  03/26/23 126/80  01/21/23 118/78  10/10/22 138/82    Wt Readings from Last 3 Encounters:  03/26/23 164 lb 6.4 oz (74.6 kg)  01/21/23 160 lb 12.8 oz (72.9 kg)  10/10/22 160 lb 12.8 oz (72.9 kg)    Physical Exam Vitals reviewed.  Constitutional:      General: She is not in acute distress.    Appearance: Normal appearance. She is normal weight. She is not ill-appearing, toxic-appearing or diaphoretic.  HENT:     Head: Normocephalic.  Eyes:     General: No scleral icterus.       Right eye: No discharge.        Left eye: No discharge.     Conjunctiva/sclera: Conjunctivae normal.  Cardiovascular:     Rate and Rhythm: Normal rate and regular rhythm.     Heart sounds: Normal heart sounds.  Pulmonary:     Effort: Pulmonary effort is normal. No respiratory distress.     Breath sounds: Normal breath sounds.  Musculoskeletal:        General: Tenderness present. Normal range of motion.       Legs:     Comments: Bilateral positive sgtraight leg lift  No muscle weakness   Skin:    General: Skin is warm and dry.  Neurological:     General: No focal deficit present.     Mental Status: She is alert and oriented  to person, place, and time. Mental status is at baseline.     Motor: Weakness present.     Comments: Bilateral straight leg lifts positive   Psychiatric:        Mood and Affect: Mood normal.        Behavior: Behavior normal.        Thought Content: Thought content normal.        Judgment: Judgment normal.    Lab Results  Component Value Date   HGBA1C 5.7 (H) 10/10/2022   HGBA1C 5.9 12/06/2021   HGBA1C 5.5 03/29/2018    Lab Results  Component Value Date   CREATININE 0.78 01/21/2023   CREATININE 0.82 10/10/2022   CREATININE 0.85 12/06/2021    Lab Results  Component Value Date   WBC 6.5 01/21/2023   HGB 12.8 01/21/2023   HCT 39.5 01/21/2023   PLT 327.0 01/21/2023   GLUCOSE 95 01/21/2023    CHOL 144 12/06/2021   TRIG 93.0 12/06/2021   HDL 55.00 12/06/2021   LDLDIRECT 65.0 12/06/2021   LDLCALC 71 12/06/2021   ALT 13 01/21/2023   AST 19 01/21/2023   NA 137 01/21/2023   K 4.3 01/21/2023   CL 102 01/21/2023   CREATININE 0.78 01/21/2023   BUN 12 01/21/2023   CO2 28 01/21/2023   TSH 4.37 01/21/2023   HGBA1C 5.7 (H) 10/10/2022   MICROALBUR <0.7 02/11/2016    ECHOCARDIOGRAM COMPLETE Result Date: 02/27/2023    ECHOCARDIOGRAM REPORT   Patient Name:   Cynthia Ruiz Alf Date of Exam: 02/27/2023 Medical Rec #:  [id number]      Height:       63.0 in Accession #:    [id number]     Weight:       160.8 lb Date of Birth:  February 28, 1966      BSA:          1.762 m Patient Age:    57 years       BP:           118/78 mmHg Patient Gender: F              HR:           91 bpm. Exam Location:  [facility] Procedure: 2D Echo, Cardiac Doppler and Color Doppler Indications:     Abnormal ECG R94.31                  LVH by ECG.  History:         Patient has no prior history of Echocardiogram examinations.                  Anxiety.  Sonographer:     Cristela Blue Referring Phys:  [id number] Kam Rahimi L Hipolito Martinezlopez Diagnosing Phys: Mellody Drown Alluri IMPRESSIONS  1. Left ventricular ejection fraction, by estimation, is 50 to 55%. The left ventricle has normal function. The left ventricle has no regional wall motion abnormalities. Left ventricular diastolic parameters are consistent with Grade I diastolic dysfunction (impaired relaxation).  2. Right ventricular systolic function is normal. The right ventricular size is normal.  3. The mitral valve is normal in structure. Trivial mitral valve regurgitation.  4. The aortic valve is tricuspid. Aortic valve regurgitation is not visualized.  5. The inferior vena cava is normal in size with greater than 50% respiratory variability, suggesting right atrial pressure of 3 mmHg. FINDINGS  Left Ventricle: Left ventricular ejection fraction, by estimation, is 50 to 55%. The left ventricle has normal function. The  left ventricle has no regional wall motion abnormalities. The left ventricular internal cavity size was normal in size. There is  no left ventricular hypertrophy. Left ventricular diastolic parameters are consistent with Grade I diastolic dysfunction (impaired relaxation). Right Ventricle: The right ventricular size is normal. No increase in right ventricular wall thickness. Right ventricular systolic function is normal. Left Atrium: Left atrial size was normal in size. Right Atrium: Right atrial size was normal in size. Pericardium: There is no evidence of pericardial effusion. Mitral Valve: The mitral valve is normal in structure. Trivial mitral valve regurgitation. Tricuspid Valve: The tricuspid valve is normal in structure. Tricuspid valve regurgitation is trivial. Aortic Valve: The aortic valve is tricuspid. Aortic valve regurgitation is not visualized. Aortic valve mean gradient measures 3.0 mmHg. Aortic valve peak gradient measures 4.8 mmHg. Aortic valve area, by VTI measures 2.75 cm. Pulmonic Valve: The pulmonic valve was not well visualized. Pulmonic valve regurgitation is trivial. Aorta: The aortic root is normal in size and structure. Venous: The inferior vena cava is normal in size with greater than 50% respiratory variability, suggesting right atrial pressure of 3 mmHg. IAS/Shunts: No atrial level shunt detected by color flow Doppler.  LEFT VENTRICLE PLAX 2D LVIDd:         4.50 cm   Diastology LVIDs:         3.30 cm   LV e' medial:    5.44 cm/s LV PW:         0.80 cm   LV E/e' medial:  9.2 LV IVS:        1.00 cm   LV e' lateral:   7.72 cm/s LVOT diam:     2.20 cm   LV E/e' lateral: 6.5 LV SV:         58 LV SV Index:   33 LVOT Area:     3.80 cm  RIGHT VENTRICLE RV Basal diam:  2.90 cm RV Mid diam:    2.60 cm RV S prime:     12.10 cm/s TAPSE (M-mode): 2.2 cm LEFT ATRIUM             Index        RIGHT ATRIUM           Index LA diam:        2.00 cm 1.13 cm/m   RA Area:     12.20 cm LA Vol (A2C):   28.8  ml 16.34 ml/m  RA Volume:   27.30 ml  15.49 ml/m LA Vol (A4C):   15.7 ml 8.91 ml/m LA Biplane Vol: 21.9 ml 12.43 ml/m  AORTIC VALVE AV Area (Vmax):    2.62 cm AV Area (Vmean):   2.57 cm AV Area (VTI):     2.75 cm AV Vmax:           109.00 cm/s AV Vmean:          75.200 cm/s AV VTI:            0.210 m AV Peak Grad:      4.8 mmHg AV Mean Grad:      3.0 mmHg LVOT Vmax:         75.00 cm/s LVOT Vmean:        50.900 cm/s LVOT VTI:          0.152 m LVOT/AV VTI ratio: 0.72  AORTA Ao Root diam: 2.80 cm MITRAL VALVE               TRICUSPID VALVE MV  Area (PHT): 7.22 cm    TR Peak grad:   16.8 mmHg MV Decel Time: 105 msec    TR Vmax:        205.00 cm/s MV E velocity: 50.30 cm/s MV A velocity: 94.90 cm/s  SHUNTS MV E/A ratio:  0.53        Systemic VTI:  0.15 m                            Systemic Diam: 2.20 cm Windell Norfolk Electronically signed by Windell Norfolk Signature Date/Time: 02/27/2023/4:37:07 PM    Final     Assessment & Plan:  .Sciatica associated with disorder of lumbosacral spine Assessment & Plan: Episodic ,  aggravated by Christmas tree decorating last week .  Will rx prednisone, gabapentin and MR .  Repeat lumbar films  Orders: -     Ambulatory referral to Physical Therapy -     methylPREDNISolone Acetate  Paronychia of finger, right -     DG Lumbar Spine Complete; Future  Paroxysmal supraventricular tachycardia [facility]) Assessment & Plan: Recurrent ,  two episodes in September.   ECHO and ZIO monitor reviewed and were normal.    Other orders -     tiZANidine HCl; 1-2 tablets every 6 hours as needed for muscle spasm  Dispense: 60 tablet; Refill: 0 -     Celecoxib; Take 1 capsule (200 mg total) by mouth 2 (two) times daily.  Dispense: 60 capsule; Refill: 0 -     HYDROcodone-Acetaminophen; Take 1 tablet by mouth every 6 (six) hours as needed for up to 7 days.  Dispense: 30 tablet; Refill: 0 -     Cephalexin; Take 1 capsule (500 mg total) by mouth 4 (four) times daily.  Dispense: 28  capsule; Refill: 0     I provided 30 minutes of face-to-face time during this encounter reviewing patient's last visit with me,  recent surgical and non surgical procedures, previous  labs and imaging studies, counseling on currently addressed issues,  and post visit ordering to diagnostics and therapeutics .   Follow-up: Return in about 4 weeks (around 04/23/2023).   Sherlene Shams, MD

## 2023-03-26 NOTE — Assessment & Plan Note (Addendum)
Episodic ,  aggravated by Christmas tree decorating last week .  Will rx prednisone, gabapentin and MR .  Repeat lumbar films

## 2023-03-26 NOTE — Patient Instructions (Addendum)
CEPHALEXIN 4 TIME S DAILY FOR THE PARONYCHIA (CUTICLE INFECTED)   FOR THE SCIATICA:  You received a high dose of steroids today  Start taking celebrex two times daily TOMORROW (NOT A STEROID)  ADD TYLENOL,  UP TO 4000 MG DAILY  (1000 MG EVERY 6 HOURS ) FOR ONE WEEK, THEN REDUCE TO 2000 MG DAILY IF STILL NEEDED AFTER ONE WEEK  TIZANIDINE IS A MUSCLE RELAXER,  I HAVE PRESCRIBED IT AT THE LOWEST DOSE BECAUSE IT MAY MAKE YOU DROWSY  SAVE THE HYDROCODONE FOR BEDTIME OR SEVERE PAIN NOT RELIEVED WITH CELEBREX, TYELNOL AND TIZANIDINE  YOU CAN ADD SALONPAS PATCH WITH LIDOCAINE IF IT HELPS.   ALTERNATE HEAT AND ICE ON LOWER BACK .    NO LIFTING OR BENDING .  [name] AND REBECCA NEED TO STEP UP   PHYSICAL THERAPY REFERRAL MADE

## 2023-03-28 NOTE — Assessment & Plan Note (Addendum)
Recurrent ,  two episodes in September.   ECHO and ZIO monitor reviewed and were normal.

## 2023-03-31 ENCOUNTER — Ambulatory Visit: Payer: BC Managed Care – PPO | Admitting: Internal Medicine

## 2023-03-31 ENCOUNTER — Ambulatory Visit: Payer: BC Managed Care – PPO | Attending: Internal Medicine

## 2023-03-31 DIAGNOSIS — M5386 Other specified dorsopathies, lumbar region: Secondary | ICD-10-CM | POA: Diagnosis not present

## 2023-03-31 DIAGNOSIS — M5459 Other low back pain: Secondary | ICD-10-CM | POA: Insufficient documentation

## 2023-03-31 DIAGNOSIS — M5387 Other specified dorsopathies, lumbosacral region: Secondary | ICD-10-CM | POA: Insufficient documentation

## 2023-03-31 DIAGNOSIS — M5442 Lumbago with sciatica, left side: Secondary | ICD-10-CM | POA: Insufficient documentation

## 2023-03-31 DIAGNOSIS — M5441 Lumbago with sciatica, right side: Secondary | ICD-10-CM | POA: Diagnosis not present

## 2023-03-31 NOTE — Therapy (Signed)
OUTPATIENT PHYSICAL THERAPY THORACOLUMBAR EVALUATION   Patient Name: SHANTARA ZWOLINSKI MRN: [id number] DOB:06/10/1965, 57 y.o., female Today's Date: 03/31/2023  END OF SESSION:  PT End of Session - 03/31/23 1257     Visit Number 1    Number of Visits 25    Date for PT Re-Evaluation 06/23/23    Authorization Type BLUE [employer]    Authorization - Number of Visits 30    Progress Note Due on Visit 10    PT Start Time 1030    PT Stop Time 1124    PT Time Calculation (min) 54 min    Behavior During Therapy [facility] for tasks assessed/performed             Past Medical History:  Diagnosis Date   Anxiety    Past Surgical History:  Procedure Laterality Date   CESAREAN SECTION  1991, 2002, 2007   COLONOSCOPY WITH PROPOFOL N/A 07/18/2015   Procedure: COLONOSCOPY WITH PROPOFOL;  Surgeon: Earline Mayotte, MD;  Location: [facility] ENDOSCOPY;  Service: Endoscopy;  Laterality: N/A;   COLONOSCOPY WITH PROPOFOL N/A 08/09/2021   Procedure: COLONOSCOPY WITH PROPOFOL;  Surgeon: [name], MD;  Location: [facility] ENDOSCOPY;  Service: Gastroenterology;  Laterality: N/A;   KNEE ARTHROSCOPY Right 1991   Pyloric Stenosis surgery     TUBAL LIGATION  2007   Patient Active Problem List   Diagnosis Date Noted   Palpitations 01/22/2023   Caregiver with fatigue 01/21/2023   Cystitis 10/10/2022   Cerumen impaction 10/10/2022   Paroxysmal supraventricular tachycardia (HCC) 08/04/2021   Witnessed apneic spells 08/02/2021   Internal hemorrhoids without complication 05/13/2021   Gastroesophageal reflux disease 05/13/2021   Left shoulder pain 02/02/2021   Subclinical hypothyroidism 02/02/2021   Hyperlipidemia LDL goal <160 01/30/2021   Insomnia 07/24/2020   Wears hearing aid 07/20/2020   Sciatica associated with disorder of lumbosacral spine 09/25/2019   Coccygeal pain, chronic 02/23/2019   Encounter for screening laboratory testing for COVID-19 virus 01/29/2019   Constipation 01/29/2019   Right  shoulder pain 07/01/2016   Anxiety as acute reaction to gross stress 02/12/2016   Encounter for screening colonoscopy 06/07/2015   Overweight 05/21/2015   Positive colorectal cancer screening using Cologuard test 06/20/2014   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   Rosacea 02/14/2013   Routine general medical examination at a health care facility 02/14/2013    PCP: Sherlene Shams, MD  REFERRING PROVIDER: Sherlene Shams, MD  REFERRING DIAG: [phone] (ICD-10-CM) - Sciatica associated with disorder of lumbosacral spine  Rationale for Evaluation and Treatment: Rehabilitation  THERAPY DIAG:  Acute bilateral low back pain with bilateral sciatica  Decreased ROM of lumbar spine  ONSET DATE: 03/19/2023  SUBJECTIVE:  SUBJECTIVE STATEMENT:  Pt is referred ot therapy for low back pain with sciatica in the B LE's.    PERTINENT HISTORY:   Pt is being seen for low back pain.  Pt notes that she Sunday prior to the increased pain, she was bending over to color her hair for a prolonged period of time.  Pt then was attempted to put the manger scene below the Christmas tree.  Pt notes that she is unsure of when it specifically occurred, but she went to bed and basically any movement caused excruciating pain.  Pt notes that she started to experience bilateral sciatica shortly following.  Pt went to see Dr. Darrick Huntsman and received medications that have helped the pain.  Pt notes that she may have agitated it yesterday by picking up and doing things.  Pt supports that she does not know how to sit down.  Pt otherwise has noted a reduction with the pain medications.    PAIN: Are you having pain? Yes: NPRS scale: 5/10 Pain location: lower back  Pain description: throbbing Aggravating factors: bending, lifting, squatting,  sitting Relieving factors: medications, heating pad Worst Pain over 7 Days: 7/10 Best Pain over 7 Days: 2/10  PRECAUTIONS: None  RED FLAGS: None   WEIGHT BEARING RESTRICTIONS: No  FALLS:  Has patient fallen in last 6 months? No  LIVING ENVIRONMENT: Lives with: lives with their family Lives in: House/apartment Stairs: Yes: Internal: 13 steps; on right going up Has following equipment at home:  None  OCCUPATION: Teacher at [employer]  PLOF: Independent  PATIENT GOALS: Strengthen core for lumbar stability, get back in shape.  NEXT MD VISIT: 04/29/23  OBJECTIVE:  Note: Objective measures were completed at Evaluation unless otherwise noted.  DIAGNOSTIC FINDINGS:  X-Ray performed on 03/26/23, findings not in yet  PATIENT SURVEYS:  FOTO 38/68  COGNITION: Overall cognitive status: Within functional limits for tasks assessed     SENSATION: WFL  POSTURE: No Significant postural limitations  PALPATION: Pt with significant pain in the L3 and L5 lumbar region upon performing PA's to the region.  Pt also having increased pain with sacrum provocation as well.  Bilateral piriformis is tender, however L>R.  LUMBAR ROM:  To be assessed at next visit [facility] eval  Flexion   Extension   Right lateral flexion   Left lateral flexion   Right rotation   Left rotation    (Blank rows = not tested)   LOWER EXTREMITY ROM:     Active  Right eval Left eval  Hip flexion 4+ 4-  Hip extension    Hip abduction 4 4-  Hip adduction 4 4-  Hip internal rotation    Hip external rotation    Knee flexion 4- 4-  Knee extension 4- 4-  Ankle dorsiflexion 5 5   (Blank rows = not tested)  LOWER EXTREMITY MMT:    MMT Right eval Left eval  Hip flexion 4+ 4-  Hip abduction 4 4-  Hip adduction 4 4-  Knee flexion 4- 4-  Knee extension 4- 4-  Ankle dorsiflexion 5 5   (Blank rows = not tested)  LUMBAR SPECIAL TESTS:  Straight leg raise test: Negative, Slump test: Positive, Stork standing:  Negative, SI Compression/distraction test: Positive, and Thomas test: Positive  FUNCTIONAL TESTS:  5 times sit to stand: 12.53 sec   TODAY'S TREATMENT: DATE: 03/31/23  Eval Only   PATIENT EDUCATION:  Education details: Pt educated on role of PT and services provided during current POC, along  with prognosis and information about the clinic. Person educated: Patient Education method: Explanation, Demonstration, and Tactile cues Education comprehension: verbalized understanding  HOME EXERCISE PROGRAM:  Access Code: [email or web address] URL: [email or web address] Date: 03/31/2023 Prepared by: Tomasa Hose  Exercises - Supine Bridge  - 1 x daily - 7 x weekly - 3 sets - 10 reps - Supine Lower Trunk Rotation  - 1 x daily - 7 x weekly - 3 sets - 10 reps - Supine Pelvic Tilt  - 1 x daily - 7 x weekly - 3 sets - 10 reps - Clamshell  - 1 x daily - 7 x weekly - 3 sets - 10 reps    ASSESSMENT:  CLINICAL IMPRESSION: Patient is a 57 y.o. female who was seen today for physical therapy evaluation and treatment for low back pain with bilateral sciatica.     OBJECTIVE IMPAIRMENTS: decreased activity tolerance, decreased endurance, decreased knowledge of condition, decreased mobility, difficulty walking, decreased ROM, decreased strength, hypomobility, and pain.   ACTIVITY LIMITATIONS: carrying, lifting, bending, sitting, standing, squatting, stairs, and locomotion level  PARTICIPATION LIMITATIONS: cleaning, laundry, shopping, community activity, occupation, and yard work  PERSONAL FACTORS: Fitness, Past/current experiences, Time since onset of injury/illness/exacerbation, and 1-2 comorbidities: Shoulder pain, hearing difficulty, coccygeal pain  are also affecting patient's functional outcome.   REHAB POTENTIAL: Good  CLINICAL DECISION MAKING: Stable/uncomplicated  EVALUATION COMPLEXITY: Low   GOALS: Goals reviewed with patient? Yes  SHORT TERM GOALS: Target date:  04/28/2023  Pt will be independent with HEP in order to demonstrate increased ability to perform tasks related to occupation/hobbies. Baseline:  Pt given HEP at initial evaluation Goal status: INITIAL  LONG TERM GOALS: Target date: 06/23/2023  Patient will demonstrate improved function as evidenced by a score of 68 on FOTO measure for full participation in activities at home and in the community. Baseline: FOTO: 38 Goal status: INITIAL  2.  Pt will independently ascend and descend one full flight of stairs (12-15 steps) using a handrail, with pain reported <= 3/10 to improve functional mobility and independence. Baseline: Pt only able to ascend 2-3 steps with 5/10 pain on medication.   Goal status: INITIAL  3.  Patient will complete five times sit to stand test in < 12 seconds indicating an increased LE strength and improved balance. Baseline: 12.53 sec Goal status: INITIAL   PLAN:  PT FREQUENCY: 2x/week  PT DURATION: 12 weeks  PLANNED INTERVENTIONS: [id number]- PT Re-evaluation, 97110-Therapeutic exercises, 97530- Therapeutic activity, 97112- Neuromuscular re-education, 97535- Self Care, 16109- Manual therapy, [id number]- Aquatic Therapy, 97035- Ultrasound, [id number]- Traction (mechanical), Balance training, Taping, Dry Needling, Joint mobilization, Joint manipulation, Spinal manipulation, Spinal mobilization, Cryotherapy, and Moist heat.  PLAN FOR NEXT SESSION: Check for leg length discrepancy and pelvic assessment due to positive pelvic tests.  Improve core strengthening as well for lumbar stability.   Nolon Bussing, PT, DPT Physical Therapist - [facility]  03/31/23, 12:58 PM

## 2023-04-02 ENCOUNTER — Ambulatory Visit: Payer: BC Managed Care – PPO

## 2023-04-02 DIAGNOSIS — M5459 Other low back pain: Secondary | ICD-10-CM | POA: Diagnosis not present

## 2023-04-02 DIAGNOSIS — M5441 Lumbago with sciatica, right side: Secondary | ICD-10-CM | POA: Diagnosis not present

## 2023-04-02 DIAGNOSIS — M5442 Lumbago with sciatica, left side: Secondary | ICD-10-CM | POA: Diagnosis not present

## 2023-04-02 DIAGNOSIS — M5387 Other specified dorsopathies, lumbosacral region: Secondary | ICD-10-CM | POA: Diagnosis not present

## 2023-04-02 DIAGNOSIS — M5386 Other specified dorsopathies, lumbar region: Secondary | ICD-10-CM | POA: Diagnosis not present

## 2023-04-02 NOTE — Therapy (Signed)
OUTPATIENT PHYSICAL THERAPY Treatment   Patient Name: Cynthia Ruiz MRN: [id number] DOB:1966-03-04, 57 y.o., female Today's Date: 04/02/2023  END OF SESSION:  PT End of Session - 04/02/23 1035     Visit Number 2    Number of Visits 25    Date for PT Re-Evaluation 06/23/23    Authorization Type BLUE CROSS BLUE [employer]IELD    Authorization - Number of Visits 30    Progress Note Due on Visit 10    PT Start Time 1036    PT Stop Time 1114    PT Time Calculation (min) 38 min    Behavior During Therapy [facility] for tasks assessed/performed              Past Medical History:  Diagnosis Date   Anxiety    Past Surgical History:  Procedure Laterality Date   CESAREAN SECTION  1991, 2002, 2007   COLONOSCOPY WITH PROPOFOL N/A 07/18/2015   Procedure: COLONOSCOPY WITH PROPOFOL;  Surgeon: Earline Mayotte, MD;  Location: [facility] ENDOSCOPY;  Service: Endoscopy;  Laterality: N/A;   COLONOSCOPY WITH PROPOFOL N/A 08/09/2021   Procedure: COLONOSCOPY WITH PROPOFOL;  Surgeon: [name], MD;  Location: [facility] ENDOSCOPY;  Service: Gastroenterology;  Laterality: N/A;   KNEE ARTHROSCOPY Right 1991   Pyloric Stenosis surgery     TUBAL LIGATION  2007   Patient Active Problem List   Diagnosis Date Noted   Palpitations 01/22/2023   Caregiver with fatigue 01/21/2023   Cystitis 10/10/2022   Cerumen impaction 10/10/2022   Paroxysmal supraventricular tachycardia (HCC) 08/04/2021   Witnessed apneic spells 08/02/2021   Internal hemorrhoids without complication 05/13/2021   Gastroesophageal reflux disease 05/13/2021   Left shoulder pain 02/02/2021   Subclinical hypothyroidism 02/02/2021   Hyperlipidemia LDL goal <160 01/30/2021   Insomnia 07/24/2020   Wears hearing aid 07/20/2020   Sciatica associated with disorder of lumbosacral spine 09/25/2019   Coccygeal pain, chronic 02/23/2019   Encounter for screening laboratory testing for COVID-19 virus 01/29/2019   Constipation 01/29/2019   Right shoulder pain  07/01/2016   Anxiety as acute reaction to gross stress 02/12/2016   Encounter for screening colonoscopy 06/07/2015   Overweight 05/21/2015   Positive colorectal cancer screening using Cologuard test 06/20/2014   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   Rosacea 02/14/2013   Routine general medical examination at a health care facility 02/14/2013    PCP: Sherlene Shams, MD  REFERRING PROVIDER: Sherlene Shams, MD  REFERRING DIAG: [phone] (ICD-10-CM) - Sciatica associated with disorder of lumbosacral spine  Rationale for Evaluation and Treatment: Rehabilitation  THERAPY DIAG:  Acute bilateral low back pain with bilateral sciatica  Decreased ROM of lumbar spine  ONSET DATE: 03/19/2023  SUBJECTIVE:  SUBJECTIVE STATEMENT:  Low back is not too bad. Was a bit sore after eval. 3/10 low back pain currently. No LE pain currently.     PERTINENT HISTORY:   Pt is being seen for low back pain.  Pt notes that she Sunday prior to the increased pain, she was bending over to color her hair for a prolonged period of time.  Pt then was attempted to put the manger scene below the Christmas tree.  Pt notes that she is unsure of when it specifically occurred, but she went to bed and basically any movement caused excruciating pain.  Pt notes that she started to experience bilateral sciatica shortly following.  Pt went to see Dr. Darrick Huntsman and received medications that have helped the pain.  Pt notes that she may have agitated it yesterday by picking up and doing things.  Pt supports that she does not know how to sit down.  Pt otherwise has noted a reduction with the pain medications.    PAIN: Are you having pain? Yes: NPRS scale: 3/10 Pain location: lower back  Pain description: throbbing Aggravating factors: bending, lifting,  squatting, sitting Relieving factors: medications, heating pad Worst Pain over 7 Days: 7/10 Best Pain over 7 Days: 2/10  PRECAUTIONS: None  RED FLAGS: None   WEIGHT BEARING RESTRICTIONS: No  FALLS:  Has patient fallen in last 6 months? No  LIVING ENVIRONMENT: Lives with: lives with their family Lives in: House/apartment Stairs: Yes: Internal: 13 steps; on right going up Has following equipment at home:  None  OCCUPATION: Teacher at [employer]  PLOF: Independent  PATIENT GOALS: Strengthen core for lumbar stability, get back in shape.  NEXT MD VISIT: 04/29/23  OBJECTIVE:  Note: Objective measures were completed at Evaluation unless otherwise noted.  DIAGNOSTIC FINDINGS:  X-Ray performed on 03/26/23, findings not in yet  PATIENT SURVEYS:  FOTO 38/68  COGNITION: Overall cognitive status: Within functional limits for tasks assessed     SENSATION: WFL  POSTURE: No Significant postural limitations  PALPATION: Pt with significant pain in the L3 and L5 lumbar region upon performing PA's to the region.  Pt also having increased pain with sacrum provocation as well.  Bilateral piriformis is tender, however L>R.  LUMBAR ROM:    AROM 04/02/2023  Flexion WFL with B LE (sciatic nerve tightness)  Extension WFL with L5/S1 area pain and hinging. L4 area hinging as well   Right lateral flexion WFL with L thoracic pain with overpressure  Left lateral flexion WFL with L4 area pinching/shooting pain  Right rotation WFL with L4-S1 area shooting pain  Left rotation WFL with L4-S1 area symptoms, but not as bad   (Blank rows = not tested)   LOWER EXTREMITY ROM:     Active  Right eval Left eval  Hip flexion 4+ 4-  Hip extension    Hip abduction 4 4-  Hip adduction 4 4-  Hip internal rotation    Hip external rotation    Knee flexion 4- 4-  Knee extension 4- 4-  Ankle dorsiflexion 5 5   (Blank rows = not tested)  LOWER EXTREMITY MMT:    MMT Right eval Left eval  Hip  flexion 4+ 4-  Hip abduction 4 4-  Hip adduction 4 4-  Knee flexion 4- 4-  Knee extension 4- 4-  Ankle dorsiflexion 5 5   (Blank rows = not tested)  LUMBAR SPECIAL TESTS:  Straight leg raise test: Negative, Slump test: Positive, Stork standing: Negative, SI Compression/distraction test:  Positive, and Thomas test: Positive  (+) repeated flexion test     FUNCTIONAL TESTS:  5 times sit to stand: 12.53 sec   TODAY'S TREATMENT: DATE: 04/02/23   Therapeutic exercise  Posture: R lumbar convexity around L 4 TTP L4/L5 area  Standing lumbar AROM all planes  Seated R to L pressure to R lumbar convexity with strap 10x5 seconds  Decreased pain initially but symptoms increased in R posterior hip  Seated manually resisted L lateral shift isometrics in neutral 10x5 seconds for 3 sets  Decreased low back and posterior hip pain  Supine posterior pelvic tilt 5 seconds x 1  Increased spine pain  Seated B scapular retraction 10x5 seconds for 3 sets  Seated transversus abdominis contraction 5 seconds     Improved exercise technique, movement at target joints, use of target muscles after mod verbal, visual, tactile cues.        PATIENT EDUCATION:  Education details: Pt educated on role of PT and services provided during current POC, along with prognosis and information about the clinic. Person educated: Patient Education method: Explanation, Demonstration, and Tactile cues Education comprehension: verbalized understanding  HOME EXERCISE PROGRAM:  Access Code: [email or web address] URL: [email or web address] Date: 03/31/2023 Prepared by: Tomasa Hose  Exercises   Held off on 04/02/2023 due to pain in supine position  - Supine Pelvic Tilt  - 1 x daily - 7 x weekly - 3 sets - 10 reps - Supine Bridge  - 1 x daily - 7 x weekly - 3 sets - 10 reps - Supine Lower Trunk Rotation  - 1 x daily - 7 x weekly - 3 sets - 10 reps  - Clamshell  - 1 x daily - 7 x weekly - 3 sets -  10 reps    Added on 04/02/2023  Access Code: [email or web address] URL: [email or web address] Date: 04/02/2023 Prepared by: Loralyn Freshwater  Exercises - Seated Scapular Retraction  - 3 x daily - 7 x weekly - 3 sets - 10 reps - 5 seconds hold  Seated transversus abdominis contraction   ASSESSMENT:  Decreased low back and posterior hip pain with treatment to decrease R lumbar convexity utilizing her muscles. Also worked on thoracic extension and trunk muscle activation to decrease stress to low back. Pt tolerated session well without aggravation of symptoms. Pt will benefit from continued skilled physical therapy services to decrease pain, improve strength and function.     CLINICAL IMPRESSION: Patient is a 57 y.o. female who was seen today for physical therapy evaluation and treatment for low back pain with bilateral sciatica.     OBJECTIVE IMPAIRMENTS: decreased activity tolerance, decreased endurance, decreased knowledge of condition, decreased mobility, difficulty walking, decreased ROM, decreased strength, hypomobility, and pain.   ACTIVITY LIMITATIONS: carrying, lifting, bending, sitting, standing, squatting, stairs, and locomotion level  PARTICIPATION LIMITATIONS: cleaning, laundry, shopping, community activity, occupation, and yard work  PERSONAL FACTORS: Fitness, Past/current experiences, Time since onset of injury/illness/exacerbation, and 1-2 comorbidities: Shoulder pain, hearing difficulty, coccygeal pain  are also affecting patient's functional outcome.   REHAB POTENTIAL: Good  CLINICAL DECISION MAKING: Stable/uncomplicated  EVALUATION COMPLEXITY: Low   GOALS: Goals reviewed with patient? Yes  SHORT TERM GOALS: Target date: 04/28/2023  Pt will be independent with HEP in order to demonstrate increased ability to perform tasks related to occupation/hobbies. Baseline:  Pt given HEP at initial evaluation Goal status: INITIAL  LONG TERM GOALS: Target date:  06/23/2023  Patient will demonstrate improved function as evidenced by a score of 68 on  FOTO measure for full participation in activities at home and in the community. Baseline: FOTO: 38 Goal status: INITIAL  2.  Pt will independently ascend and descend one full flight of stairs (12-15 steps) using a handrail, with pain reported <= 3/10 to improve functional mobility and independence. Baseline: Pt only able to ascend 2-3 steps with 5/10 pain on medication.   Goal status: INITIAL  3.  Patient will complete five times sit to stand test in < 12 seconds indicating an increased LE strength and improved balance. Baseline: 12.53 sec Goal status: INITIAL   PLAN:  PT FREQUENCY: 2x/week  PT DURATION: 12 weeks  PLANNED INTERVENTIONS: 97164- PT Re-evaluation, 97110-Therapeutic exercises, 97530- Therapeutic activity, 97112- Neuromuscular re-education, 97535- Self Care, 16109- Manual therapy, [id number]- Aquatic Therapy, 97035- Ultrasound, [id number]- Traction (mechanical), Balance training, Taping, Dry Needling, Joint mobilization, Joint manipulation, Spinal manipulation, Spinal mobilization, Cryotherapy, and Moist heat.  PLAN FOR NEXT SESSION: Check for leg length discrepancy and pelvic assessment due to positive pelvic tests.  Improve core strengthening as well for lumbar stability.   Loralyn Freshwater PT, DPT   Physical Therapist - [facility]  04/02/23, 12:26 PM

## 2023-04-14 ENCOUNTER — Ambulatory Visit: Payer: BC Managed Care – PPO

## 2023-04-20 ENCOUNTER — Other Ambulatory Visit: Payer: Self-pay | Admitting: Internal Medicine

## 2023-04-20 ENCOUNTER — Ambulatory Visit: Payer: BC Managed Care – PPO | Attending: Internal Medicine

## 2023-04-20 DIAGNOSIS — M5441 Lumbago with sciatica, right side: Secondary | ICD-10-CM | POA: Diagnosis not present

## 2023-04-20 DIAGNOSIS — M5386 Other specified dorsopathies, lumbar region: Secondary | ICD-10-CM | POA: Diagnosis not present

## 2023-04-20 DIAGNOSIS — M5442 Lumbago with sciatica, left side: Secondary | ICD-10-CM | POA: Diagnosis not present

## 2023-04-20 NOTE — Therapy (Signed)
 OUTPATIENT PHYSICAL THERAPY Treatment   Patient Name: Cynthia Ruiz MRN: [id number] DOB:1965-09-01, 58 y.o., female Today's Date: 04/20/2023  END OF SESSION:  PT End of Session - 04/20/23 0940     Visit Number 3    Number of Visits 25    Date for PT Re-Evaluation 06/23/23    Authorization Type BLUE CROSS BLUE SHIELD    Authorization - Number of Visits 30    Progress Note Due on Visit 10    PT Start Time 0941    PT Stop Time 1026    PT Time Calculation (min) 45 min    Behavior During Therapy [facility] for tasks assessed/performed               Past Medical History:  Diagnosis Date   Anxiety    Past Surgical History:  Procedure Laterality Date   CESAREAN SECTION  1991, 2002, 2007   COLONOSCOPY WITH PROPOFOL  N/A 07/18/2015   Procedure: COLONOSCOPY WITH PROPOFOL ;  Surgeon: Reyes LELON Cota, MD;  Location: [facility] ENDOSCOPY;  Service: Endoscopy;  Laterality: N/A;   COLONOSCOPY WITH PROPOFOL  N/A 08/09/2021   Procedure: COLONOSCOPY WITH PROPOFOL ;  Surgeon: Therisa Bi, MD;  Location: [facility] ENDOSCOPY;  Service: Gastroenterology;  Laterality: N/A;   KNEE ARTHROSCOPY Right 1991   Pyloric Stenosis surgery     TUBAL LIGATION  2007   Patient Active Problem List   Diagnosis Date Noted   Palpitations 01/22/2023   Caregiver with fatigue 01/21/2023   Cystitis 10/10/2022   Cerumen impaction 10/10/2022   Paroxysmal supraventricular tachycardia (HCC) 08/04/2021   Witnessed apneic spells 08/02/2021   Internal hemorrhoids without complication 05/13/2021   Gastroesophageal reflux disease 05/13/2021   Left shoulder pain 02/02/2021   Subclinical hypothyroidism 02/02/2021   Hyperlipidemia LDL goal <160 01/30/2021   Insomnia 07/24/2020   Wears hearing aid 07/20/2020   Sciatica associated with disorder of lumbosacral spine 09/25/2019   Coccygeal pain, chronic 02/23/2019   Encounter for screening laboratory testing for COVID-19 virus 01/29/2019   Constipation 01/29/2019   Right shoulder pain  07/01/2016   Anxiety as acute reaction to gross stress 02/12/2016   Encounter for screening colonoscopy 06/07/2015   Overweight 05/21/2015   Positive colorectal cancer screening using Cologuard test 06/20/2014   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   Rosacea 02/14/2013   Routine general medical examination at a health care facility 02/14/2013    PCP: Marylynn Verneita CROME, MD  REFERRING PROVIDER: Marylynn Verneita CROME, MD  REFERRING DIAG: [phone] (ICD-10-CM) - Sciatica associated with disorder of lumbosacral spine  Rationale for Evaluation and Treatment: Rehabilitation  THERAPY DIAG:  Acute bilateral low back pain with bilateral sciatica  Decreased ROM of lumbar spine  ONSET DATE: 03/19/2023  SUBJECTIVE:  SUBJECTIVE STATEMENT: Low back is not doing bad. Has a little discomfort, about a 2/10 currently. Doing a lot of bending bothers her back. Has not been getting the B sciatic pain, just the low back pain.      PERTINENT HISTORY:   Pt is being seen for low back pain.  Pt notes that she Sunday prior to the increased pain, she was bending over to color her hair for a prolonged period of time.  Pt then was attempted to put the manger scene below the Christmas tree.  Pt notes that she is unsure of when it specifically occurred, but she went to bed and basically any movement caused excruciating pain.  Pt notes that she started to experience bilateral sciatica shortly following.  Pt went to see Dr. Marylynn and received medications that have helped the pain.  Pt notes that she may have agitated it yesterday by picking up and doing things.  Pt supports that she does not know how to sit down.  Pt otherwise has noted a reduction with the pain medications.     No latex allergies.     PAIN: Are you having pain? Yes: NPRS  scale: 3/10 Pain location: lower back  Pain description: throbbing Aggravating factors: bending, lifting, squatting, sitting Relieving factors: medications, heating pad Worst Pain over 7 Days: 7/10 Best Pain over 7 Days: 2/10  PRECAUTIONS: None  RED FLAGS: None   WEIGHT BEARING RESTRICTIONS: No  FALLS:  Has patient fallen in last 6 months? No  LIVING ENVIRONMENT: Lives with: lives with their family Lives in: House/apartment Stairs: Yes: Internal: 13 steps; on right going up Has following equipment at home:  None  OCCUPATION: Teacher at [employer]  PLOF: Independent  PATIENT GOALS: Strengthen core for lumbar stability, get back in shape.  NEXT MD VISIT: 04/29/23  OBJECTIVE:  Note: Objective measures were completed at Evaluation unless otherwise noted.  DIAGNOSTIC FINDINGS:  X-Ray performed on 03/26/23, findings not in yet  PATIENT SURVEYS:  FOTO 38/68  COGNITION: Overall cognitive status: Within functional limits for tasks assessed     SENSATION: WFL  POSTURE: No Significant postural limitations  PALPATION: Pt with significant pain in the L3 and L5 lumbar region upon performing PA's to the region.  Pt also having increased pain with sacrum provocation as well.  Bilateral piriformis is tender, however L>R.  LUMBAR ROM:    AROM 04/02/2023  Flexion WFL with B LE (sciatic nerve tightness)  Extension WFL with L5/S1 area pain and hinging. L4 area hinging as well   Right lateral flexion WFL with L thoracic pain with overpressure  Left lateral flexion WFL with L4 area pinching/shooting pain  Right rotation WFL with L4-S1 area shooting pain  Left rotation WFL with L4-S1 area symptoms, but not as bad   (Blank rows = not tested)   LOWER EXTREMITY ROM:     Active  Right eval Left eval  Hip flexion 4+ 4-  Hip extension    Hip abduction 4 4-  Hip adduction 4 4-  Hip internal rotation    Hip external rotation    Knee flexion 4- 4-  Knee extension 4- 4-  Ankle  dorsiflexion 5 5   (Blank rows = not tested)  LOWER EXTREMITY MMT:    MMT Right eval Left eval  Hip flexion 4+ 4-  Hip abduction 4 4-  Hip adduction 4 4-  Knee flexion 4- 4-  Knee extension 4- 4-  Ankle dorsiflexion 5 5   (Blank rows =  not tested)  LUMBAR SPECIAL TESTS:  Straight leg raise test: Negative, Slump test: Positive, Stork standing: Negative, SI Compression/distraction test: Positive, and Thomas test: Positive  (+) repeated flexion test     FUNCTIONAL TESTS:  5 times sit to stand: 12.53 sec   TODAY'S TREATMENT: DATE: 04/20/23  Posture: R lumbar convexity around L 4 TTP L4/L5 area  Therapeutic exercise  Seated press-ups 10x5 seconds for 2 sets  Decreased low back pressure reported  Seated manually resisted R upper trunk rotation isometrics in neutral to promote L lumbar rotation posture and a more neutral lumbar position 10x3 with 5 second holds  Seated scapular retractions to promote thoracic extension 10x3 with 5 second holds  With transversus abdominis contraction  Standing B shoulder extension red band 10x5 seconds for 2 sets   Decreased B lumbar paraspinal muscle tension palpated  Standing PNF chops to the R with red band 10x5 seconds for 2 sets  Single leg dead lift with contralateral UE assist  R 10x5 seconds for 2 sets   L 10x5 seconds for 2 sets        Improved exercise technique, movement at target joints, use of target muscles after mod verbal, visual, tactile cues.        PATIENT EDUCATION:  Education details: Pt educated on role of PT and services provided during current POC, along with prognosis and information about the clinic. Person educated: Patient Education method: Explanation, Demonstration, and Tactile cues Education comprehension: verbalized understanding  HOME EXERCISE PROGRAM:  Access Code: [email or web address] URL: [email or web address] Date: 03/31/2023 Prepared by: Sidra Simpers  Exercises   Held off  on 04/02/2023 due to pain in supine position  - Supine Pelvic Tilt  - 1 x daily - 7 x weekly - 3 sets - 10 reps - Supine Bridge  - 1 x daily - 7 x weekly - 3 sets - 10 reps - Supine Lower Trunk Rotation  - 1 x daily - 7 x weekly - 3 sets - 10 reps  - Clamshell  - 1 x daily - 7 x weekly - 3 sets - 10 reps    Added on 04/02/2023  Access Code: [email or web address] URL: [email or web address] Date: 04/02/2023 Prepared by: Emil Glassman  Exercises - Seated Scapular Retraction  - 3 x daily - 7 x weekly - 3 sets - 10 reps - 5 seconds hold  - Single Arm Shoulder Extension with Anchored Resistance  - 1 x daily - 7 x weekly - 2-3 sets - 10 reps - 5 seconds hold - Seated Transversus Abdominis Bracing  - 5 x daily - 7 x weekly - 3 sets - 10 reps - 5 seconds hold     ASSESSMENT:  Worked on improving thoracic extension, glute strength, posture, and trunk muscle activation, to help decrease muscle tension to low back. Decreased back pain to 1/10 after session.  Pt will benefit from continued skilled physical therapy services to decrease pain, improve strength and function.     CLINICAL IMPRESSION: Patient is a 58 y.o. female who was seen today for physical therapy evaluation and treatment for low back pain with bilateral sciatica.     OBJECTIVE IMPAIRMENTS: decreased activity tolerance, decreased endurance, decreased knowledge of condition, decreased mobility, difficulty walking, decreased ROM, decreased strength, hypomobility, and pain.   ACTIVITY LIMITATIONS: carrying, lifting, bending, sitting, standing, squatting, stairs, and locomotion level  PARTICIPATION LIMITATIONS: cleaning, laundry, shopping, community activity, occupation, and yard work  PERSONAL FACTORS: Fitness, Past/current experiences, Time since onset of injury/illness/exacerbation, and  1-2 comorbidities: Shoulder pain, hearing difficulty, coccygeal pain  are also affecting patient's functional outcome.   REHAB POTENTIAL:  Good  CLINICAL DECISION MAKING: Stable/uncomplicated  EVALUATION COMPLEXITY: Low   GOALS: Goals reviewed with patient? Yes  SHORT TERM GOALS: Target date: 04/28/2023  Pt will be independent with HEP in order to demonstrate increased ability to perform tasks related to occupation/hobbies. Baseline:  Pt given HEP at initial evaluation Goal status: INITIAL  LONG TERM GOALS: Target date: 06/23/2023  Patient will demonstrate improved function as evidenced by a score of 68 on FOTO measure for full participation in activities at home and in the community. Baseline: FOTO: 38 Goal status: INITIAL  2.  Pt will independently ascend and descend one full flight of stairs (12-15 steps) using a handrail, with pain reported <= 3/10 to improve functional mobility and independence. Baseline: Pt only able to ascend 2-3 steps with 5/10 pain on medication.   Goal status: INITIAL  3.  Patient will complete five times sit to stand test in < 12 seconds indicating an increased LE strength and improved balance. Baseline: 12.53 sec Goal status: INITIAL   PLAN:  PT FREQUENCY: 2x/week  PT DURATION: 12 weeks  PLANNED INTERVENTIONS: 97164- PT Re-evaluation, 97110-Therapeutic exercises, 97530- Therapeutic activity, 97112- Neuromuscular re-education, 97535- Self Care, [id number]- Manual therapy, [id number]- Aquatic Therapy, 97035- Ultrasound, [id number]- Traction (mechanical), Balance training, Taping, Dry Needling, Joint mobilization, Joint manipulation, Spinal manipulation, Spinal mobilization, Cryotherapy, and Moist heat.  PLAN FOR NEXT SESSION: Check for leg length discrepancy and pelvic assessment due to positive pelvic tests.  Improve core strengthening as well for lumbar stability.   Emil Glassman PT, DPT   Physical Therapist - [facility]  04/20/23, 12:07 PM

## 2023-04-22 ENCOUNTER — Ambulatory Visit: Payer: BC Managed Care – PPO

## 2023-04-22 DIAGNOSIS — M5442 Lumbago with sciatica, left side: Secondary | ICD-10-CM | POA: Diagnosis not present

## 2023-04-22 DIAGNOSIS — M5386 Other specified dorsopathies, lumbar region: Secondary | ICD-10-CM | POA: Diagnosis not present

## 2023-04-22 DIAGNOSIS — M5441 Lumbago with sciatica, right side: Secondary | ICD-10-CM | POA: Diagnosis not present

## 2023-04-22 NOTE — Therapy (Signed)
 OUTPATIENT PHYSICAL THERAPY Treatment   Patient Name: Cynthia Ruiz MRN: [id number] DOB:[birth year], 58 y.o., female Today's Date: 04/22/2023  END OF SESSION:  PT End of Session - 04/22/23 0951     Visit Number 4    Number of Visits 25    Date for PT Re-Evaluation 06/23/23    Authorization Type BLUE CROSS BLUE [employer]IELD    Authorization - Number of Visits 30    Progress Note Due on Visit 10    PT Start Time 0951    PT Stop Time 1032    PT Time Calculation (min) 41 min    Behavior During Therapy [facility] for tasks assessed/performed                Past Medical History:  Diagnosis Date   Anxiety    Past Surgical History:  Procedure Laterality Date   CESAREAN SECTION  1991, 2002, 2007   COLONOSCOPY WITH PROPOFOL  N/A 07/18/2015   Procedure: COLONOSCOPY WITH PROPOFOL ;  Surgeon: Reyes LELON Cota, MD;  Location: [facility] ENDOSCOPY;  Service: Endoscopy;  Laterality: N/A;   COLONOSCOPY WITH PROPOFOL  N/A 08/09/2021   Procedure: COLONOSCOPY WITH PROPOFOL ;  Surgeon: Therisa Bi, MD;  Location: [facility] ENDOSCOPY;  Service: Gastroenterology;  Laterality: N/A;   KNEE ARTHROSCOPY Right 1991   Pyloric Stenosis surgery     TUBAL LIGATION  2007   Patient Active Problem List   Diagnosis Date Noted   Palpitations 01/22/2023   Caregiver with fatigue 01/21/2023   Cystitis 10/10/2022   Cerumen impaction 10/10/2022   Paroxysmal supraventricular tachycardia (HCC) 08/04/2021   Witnessed apneic spells 08/02/2021   Internal hemorrhoids without complication 05/13/2021   Gastroesophageal reflux disease 05/13/2021   Left shoulder pain 02/02/2021   Subclinical hypothyroidism 02/02/2021   Hyperlipidemia LDL goal <160 01/30/2021   Insomnia 07/24/2020   Wears hearing aid 07/20/2020   Sciatica associated with disorder of lumbosacral spine 09/25/2019   Coccygeal pain, chronic 02/23/2019   Encounter for screening laboratory testing for COVID-19 virus 01/29/2019   Constipation 01/29/2019   Right shoulder pain  07/01/2016   Anxiety as acute reaction to gross stress 02/12/2016   Encounter for screening colonoscopy 06/07/2015   Overweight 05/21/2015   Positive colorectal cancer screening using Cologuard test 06/20/2014   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   Rosacea 02/14/2013   Routine general medical examination at a health care facility 02/14/2013    PCP: Marylynn Verneita CROME, MD  REFERRING PROVIDER: Marylynn Verneita CROME, MD  REFERRING DIAG: [phone] (ICD-10-CM) - Sciatica associated with disorder of lumbosacral spine  Rationale for Evaluation and Treatment: Rehabilitation  THERAPY DIAG:  Acute bilateral low back pain with bilateral sciatica  Decreased ROM of lumbar spine  ONSET DATE: 03/19/2023  SUBJECTIVE:  SUBJECTIVE STATEMENT: Low back is not bad this morning, maybe a 1/10 surprisingly.      PERTINENT HISTORY:   Pt is being seen for low back pain.  Pt notes that she Sunday prior to the increased pain, she was bending over to color her hair for a prolonged period of time.  Pt then was attempted to put the manger scene below the Christmas tree.  Pt notes that she is unsure of when it specifically occurred, but she went to bed and basically any movement caused excruciating pain.  Pt notes that she started to experience bilateral sciatica shortly following.  Pt went to see Dr. Marylynn and received medications that have helped the pain.  Pt notes that she may have agitated it yesterday by picking up and doing things.  Pt supports that she does not know how to sit down.  Pt otherwise has noted a reduction with the pain medications.     No latex allergies.     PAIN: Are you having pain? Yes: NPRS scale: 3/10 Pain location: lower back  Pain description: throbbing Aggravating factors: bending, lifting, squatting,  sitting Relieving factors: medications, heating pad Worst Pain over 7 Days: 7/10 Best Pain over 7 Days: 2/10  PRECAUTIONS: None  RED FLAGS: None   WEIGHT BEARING RESTRICTIONS: No  FALLS:  Has patient fallen in last 6 months? No  LIVING ENVIRONMENT: Lives with: lives with their family Lives in: House/apartment Stairs: Yes: Internal: 13 steps; on right going up Has following equipment at home:  None  OCCUPATION: Teacher at [employer]  PLOF: Independent  PATIENT GOALS: Strengthen core for lumbar stability, get back in shape.  NEXT MD VISIT: 04/29/23  OBJECTIVE:  Note: Objective measures were completed at Evaluation unless otherwise noted.  DIAGNOSTIC FINDINGS:  X-Ray performed on 03/26/23, findings not in yet  PATIENT SURVEYS:  FOTO 38/68  COGNITION: Overall cognitive status: Within functional limits for tasks assessed     SENSATION: WFL  POSTURE: No Significant postural limitations  PALPATION: Pt with significant pain in the L3 and L5 lumbar region upon performing PA's to the region.  Pt also having increased pain with sacrum provocation as well.  Bilateral piriformis is tender, however L>R.  LUMBAR ROM:    AROM 04/02/2023  Flexion WFL with B LE (sciatic nerve tightness)  Extension WFL with L5/S1 area pain and hinging. L4 area hinging as well   Right lateral flexion WFL with L thoracic pain with overpressure  Left lateral flexion WFL with L4 area pinching/shooting pain  Right rotation WFL with L4-S1 area shooting pain  Left rotation WFL with L4-S1 area symptoms, but not as bad   (Blank rows = not tested)   LOWER EXTREMITY ROM:     Active  Right eval Left eval  Hip flexion 4+ 4-  Hip extension    Hip abduction 4 4-  Hip adduction 4 4-  Hip internal rotation    Hip external rotation    Knee flexion 4- 4-  Knee extension 4- 4-  Ankle dorsiflexion 5 5   (Blank rows = not tested)  LOWER EXTREMITY MMT:    MMT Right eval Left eval  Hip flexion 4+ 4-   Hip abduction 4 4-  Hip adduction 4 4-  Knee flexion 4- 4-  Knee extension 4- 4-  Ankle dorsiflexion 5 5   (Blank rows = not tested)  LUMBAR SPECIAL TESTS:  Straight leg raise test: Negative, Slump test: Positive, Stork standing: Negative, SI Compression/distraction test: Positive, and  Thomas test: Positive  (+) repeated flexion test     FUNCTIONAL TESTS:  5 times sit to stand: 12.53 sec   TODAY'S TREATMENT: DATE: 04/22/23  Posture: R lumbar convexity around L 4 TTP L4/L5 area  Therapeutic exercise  Seated press-ups 10x5 seconds for 2 sets   Seated manually resisted R upper trunk rotation isometrics in neutral to promote L lumbar rotation posture and a more neutral lumbar position 10x3 with 5 second holds  Seated scapular retractions to promote thoracic extension 10x2 with 5 second holds  With transversus abdominis contraction  Seated thoracic extension a chair back 10x5 seconds for 2 sets  Single leg dead lift with contralateral UE assist  R 10x5 seconds for 2 sets   L 10x5 seconds for 2 sets  Standing PNF chops to the R with red band 10x5 seconds for 2 sets   Standing B shoulder extension red band 10x5 seconds for 2 sets   Decreased B lumbar paraspinal muscle tension palpated    Improved exercise technique, movement at target joints, use of target muscles after mod verbal, visual, tactile cues.        PATIENT EDUCATION:  Education details: Pt educated on role of PT and services provided during current POC, along with prognosis and information about the clinic. Person educated: Patient Education method: Explanation, Demonstration, and Tactile cues Education comprehension: verbalized understanding  HOME EXERCISE PROGRAM:  Access Code: [email or web address] URL: [email or web address] Date: 03/31/2023 Prepared by: Sidra Simpers  Exercises   Held off on 04/02/2023 due to pain in supine position  - Supine Pelvic Tilt  - 1 x daily - 7 x weekly - 3  sets - 10 reps - Supine Bridge  - 1 x daily - 7 x weekly - 3 sets - 10 reps - Supine Lower Trunk Rotation  - 1 x daily - 7 x weekly - 3 sets - 10 reps  - Clamshell  - 1 x daily - 7 x weekly - 3 sets - 10 reps    Added on 04/02/2023  Access Code: [email or web address] URL: [email or web address] Date: 04/02/2023 Prepared by: Emil Glassman  Exercises - Seated Scapular Retraction  - 3 x daily - 7 x weekly - 3 sets - 10 reps - 5 seconds hold  - Single Arm Shoulder Extension with Anchored Resistance  - 1 x daily - 7 x weekly - 2-3 sets - 10 reps - 5 seconds hold - Seated Transversus Abdominis Bracing  - 5 x daily - 7 x weekly - 3 sets - 10 reps - 5 seconds hold     ASSESSMENT:  Decreasing starting low back pain based on today's and previous sessions. Continued working on improving thoracic extension, glute strength, posture, and trunk muscle activation, to help decrease muscle tension to low back. Pt will benefit from continued skilled physical therapy services to decrease pain, improve strength and function.     CLINICAL IMPRESSION:   OBJECTIVE IMPAIRMENTS: decreased activity tolerance, decreased endurance, decreased knowledge of condition, decreased mobility, difficulty walking, decreased ROM, decreased strength, hypomobility, and pain.   ACTIVITY LIMITATIONS: carrying, lifting, bending, sitting, standing, squatting, stairs, and locomotion level  PARTICIPATION LIMITATIONS: cleaning, laundry, shopping, community activity, occupation, and yard work  PERSONAL FACTORS: Fitness, Past/current experiences, Time since onset of injury/illness/exacerbation, and 1-2 comorbidities: Shoulder pain, hearing difficulty, coccygeal pain  are also affecting patient's functional outcome.   REHAB POTENTIAL: Good  CLINICAL DECISION MAKING: Stable/uncomplicated  EVALUATION COMPLEXITY: Low   GOALS: Goals reviewed with patient? Yes  SHORT TERM  GOALS: Target date: 04/28/2023  Pt will be  independent with HEP in order to demonstrate increased ability to perform tasks related to occupation/hobbies. Baseline:  Pt given HEP at initial evaluation Goal status: INITIAL  LONG TERM GOALS: Target date: 06/23/2023  Patient will demonstrate improved function as evidenced by a score of 68 on FOTO measure for full participation in activities at home and in the community. Baseline: FOTO: 38 Goal status: INITIAL  2.  Pt will independently ascend and descend one full flight of stairs (12-15 steps) using a handrail, with pain reported <= 3/10 to improve functional mobility and independence. Baseline: Pt only able to ascend 2-3 steps with 5/10 pain on medication.   Goal status: INITIAL  3.  Patient will complete five times sit to stand test in < 12 seconds indicating an increased LE strength and improved balance. Baseline: 12.53 sec Goal status: INITIAL   PLAN:  PT FREQUENCY: 2x/week  PT DURATION: 12 weeks  PLANNED INTERVENTIONS: 97164- PT Re-evaluation, 97110-Therapeutic exercises, 97530- Therapeutic activity, 97112- Neuromuscular re-education, 97535- Self Care, [id number]- Manual therapy, [id number]- Aquatic Therapy, 97035- Ultrasound, [id number]- Traction (mechanical), Balance training, Taping, Dry Needling, Joint mobilization, Joint manipulation, Spinal manipulation, Spinal mobilization, Cryotherapy, and Moist heat.  PLAN FOR NEXT SESSION: Check for leg length discrepancy and pelvic assessment due to positive pelvic tests.  Improve core strengthening as well for lumbar stability.   Emil Glassman PT, DPT   Physical Therapist - [facility]  04/22/23, 10:47 AM

## 2023-04-29 ENCOUNTER — Ambulatory Visit (INDEPENDENT_AMBULATORY_CARE_PROVIDER_SITE_OTHER): Payer: BC Managed Care – PPO | Admitting: Internal Medicine

## 2023-04-29 ENCOUNTER — Encounter: Payer: Self-pay | Admitting: Internal Medicine

## 2023-04-29 ENCOUNTER — Ambulatory Visit: Payer: BC Managed Care – PPO

## 2023-04-29 VITALS — BP 124/80 | HR 84 | Temp 97.6°F | Ht 63.0 in | Wt 164.0 lb

## 2023-04-29 DIAGNOSIS — M5387 Other specified dorsopathies, lumbosacral region: Secondary | ICD-10-CM | POA: Diagnosis not present

## 2023-04-29 DIAGNOSIS — M5441 Lumbago with sciatica, right side: Secondary | ICD-10-CM

## 2023-04-29 DIAGNOSIS — E663 Overweight: Secondary | ICD-10-CM | POA: Diagnosis not present

## 2023-04-29 DIAGNOSIS — R7303 Prediabetes: Secondary | ICD-10-CM

## 2023-04-29 DIAGNOSIS — M5386 Other specified dorsopathies, lumbar region: Secondary | ICD-10-CM | POA: Diagnosis not present

## 2023-04-29 DIAGNOSIS — M5442 Lumbago with sciatica, left side: Secondary | ICD-10-CM | POA: Diagnosis not present

## 2023-04-29 DIAGNOSIS — E785 Hyperlipidemia, unspecified: Secondary | ICD-10-CM | POA: Diagnosis not present

## 2023-04-29 NOTE — Patient Instructions (Addendum)
 I applaud your determination to lose weight on your own.  Here are some prepared foods that will help keep you from blowing your diet :  Healthy Choice "low carb power bowl"  entrees and  "Steamer" entrees are are great low carb entrees that microwave in 5 minutes      Increase your "power walk" to 30 minutes to burn calories   [place] yogurt is loaded with sugar unless you choose unflavored (NOT VANILLA ) .  OR YOU CAN buy one of these brands that use artificial or natural low glycemic index sweeteners:  Dannon Lt n Fit Greek 2 Good 2 Be True Oikos Triple Zero   You can also  a premixed protein drink L Premier Protein' Fairlife Muscle Milk Atkins Aadvantage   .    Jaclyn Massy and Veggies made Suella Emmer are two companies that make individual servings of  low carb frittata's (quiche without the crust)    Healthy /choice makes low cal/carb fudgsicles and brownies  , and KIND makes low carb mini sized bars  for snacks

## 2023-04-29 NOTE — Progress Notes (Signed)
 Subjective:  Patient ID: Cynthia Ruiz, female    DOB: 1966/04/01  Age: 58 y.o. MRN: [id number]  CC: The primary encounter diagnosis was Hyperlipidemia LDL goal <160. Diagnoses of Prediabetes, Overweight, and Sciatica associated with disorder of lumbosacral spine were also pertinent to this visit.   HPI Cynthia Ruiz presents for  Chief Complaint  Patient presents with   Medical Management of Chronic Issues   1) bilateral sciatica: treated Dec 12 with steroids, gabapentin and MR>.  Referred for PT , "it's going Ok"  and doing the exercises randomly at home.  Has not taken any meds in a week . Wondering is she can use  celebrex  prn . Using a timed heating pad at night .Aaron Aas Pain is no longer radiating down either leg.   She has learned that the pain is aggravated by prolonged standing and prolonged bending over   2) Body mass index is 29.05 kg/m. Wants to lose weight  realizes that when stressed /busy she makes poor food choices .  She has been walking twice daily, grocery shopping, doing chair yoga,  exercises on you tube for bad back.       Outpatient Medications Prior to Visit  Medication Sig Dispense Refill   omeprazole  (PRILOSEC) 40 MG capsule TAKE 1 CAPSULE (40 MG TOTAL) BY MOUTH DAILY. 90 capsule 1   celecoxib  (CELEBREX ) 200 MG capsule Take 1 capsule (200 mg total) by mouth 2 (two) times daily. (Patient not taking: Reported on 04/29/2023) 60 capsule 0   cephALEXin  (KEFLEX ) 500 MG capsule Take 1 capsule (500 mg total) by mouth 4 (four) times daily. 28 capsule 0   propranolol  (INDERAL ) 10 MG tablet Take 1 tablet (10 mg total) by mouth 3 (three) times daily. As needed for rapid heart rate (Patient not taking: Reported on 04/29/2023) 30 tablet 0   tiZANidine  (ZANAFLEX ) 2 MG tablet 1-2 TABLETS EVERY 6 HOURS AS NEEDED FOR MUSCLE SPASM (Patient not taking: Reported on 04/29/2023) 360 tablet 1   No facility-administered medications prior to visit.    Review of Systems;  Patient denies  headache, fevers, malaise, unintentional weight loss, skin rash, eye pain, sinus congestion and sinus pain, sore throat, dysphagia,  hemoptysis , cough, dyspnea, wheezing, chest pain, palpitations, orthopnea, edema, abdominal pain, nausea, melena, diarrhea, constipation, flank pain, dysuria, hematuria, urinary  Frequency, nocturia, numbness, tingling, seizures,  Focal weakness, Loss of consciousness,  Tremor, insomnia, depression, anxiety, and suicidal ideation.      Objective:  BP 124/80   Pulse 84   Temp 97.6 F (36.4 C) (Oral)   Ht 5\' 3"  (1.6 m)   Wt 164 lb (74.4 kg)   LMP 01/13/2015 (Approximate)   SpO2 99%   BMI 29.05 kg/m   BP Readings from Last 3 Encounters:  04/29/23 124/80  03/26/23 126/80  01/21/23 118/78    Wt Readings from Last 3 Encounters:  04/29/23 164 lb (74.4 kg)  03/26/23 164 lb 6.4 oz (74.6 kg)  01/21/23 160 lb 12.8 oz (72.9 kg)    Physical Exam Vitals reviewed.  Constitutional:      General: She is not in acute distress.    Appearance: Normal appearance. She is normal weight. She is not ill-appearing, toxic-appearing or diaphoretic.  HENT:     Head: Normocephalic.  Eyes:     General: No scleral icterus.       Right eye: No discharge.        Left eye: No discharge.     Conjunctiva/sclera: Conjunctivae  normal.  Cardiovascular:     Rate and Rhythm: Normal rate and regular rhythm.     Heart sounds: Normal heart sounds.  Pulmonary:     Effort: Pulmonary effort is normal. No respiratory distress.     Breath sounds: Normal breath sounds.  Musculoskeletal:        General: Normal range of motion.  Skin:    General: Skin is warm and dry.  Neurological:     General: No focal deficit present.     Mental Status: She is alert and oriented to person, place, and time. Mental status is at baseline.  Psychiatric:        Mood and Affect: Mood normal.        Behavior: Behavior normal.        Thought Content: Thought content normal.        Judgment: Judgment  normal.    Lab Results  Component Value Date   HGBA1C 5.7 (H) 10/10/2022   HGBA1C 5.9 12/06/2021   HGBA1C 5.5 03/29/2018    Lab Results  Component Value Date   CREATININE 0.78 01/21/2023   CREATININE 0.82 10/10/2022   CREATININE 0.85 12/06/2021    Lab Results  Component Value Date   WBC 6.5 01/21/2023   HGB 12.8 01/21/2023   HCT 39.5 01/21/2023   PLT 327.0 01/21/2023   GLUCOSE 95 01/21/2023   CHOL 144 12/06/2021   TRIG 93.0 12/06/2021   HDL 55.00 12/06/2021   LDLDIRECT 65.0 12/06/2021   LDLCALC 71 12/06/2021   ALT 13 01/21/2023   AST 19 01/21/2023   NA 137 01/21/2023   K 4.3 01/21/2023   CL 102 01/21/2023   CREATININE 0.78 01/21/2023   BUN 12 01/21/2023   CO2 28 01/21/2023   TSH 4.37 01/21/2023   HGBA1C 5.7 (H) 10/10/2022   MICROALBUR <0.7 02/11/2016    ECHOCARDIOGRAM COMPLETE Result Date: 02/27/2023    ECHOCARDIOGRAM REPORT   Patient Name:   Cynthia Ruiz Date of Exam: 02/27/2023 Medical Rec #:  [id number]      Height:       63.0 in Accession #:    [id number]     Weight:       160.8 lb Date of Birth:  02-20-1966      BSA:          1.762 m Patient Age:    57 years       BP:           118/78 mmHg Patient Gender: F              HR:           91 bpm. Exam Location:  [facility] Procedure: 2D Echo, Cardiac Doppler and Color Doppler Indications:     Abnormal ECG R94.31                  LVH by ECG.  History:         Patient has no prior history of Echocardiogram examinations.                  Anxiety.  Sonographer:     Broadus Canes Referring Phys:  [id number] Kielyn Kardell L [place] Jakub[place]ak Diagnosing Phys: Lida Reeks Alluri IMPRESSIONS  1. Left ventricular ejection fraction, by estimation, is 50 to 55%. The left ventricle has normal function. The left ventricle has no regional wall motion abnormalities. Left ventricular diastolic parameters are consistent with Grade I diastolic dysfunction (impaired relaxation).  2. Right ventricular systolic function is normal.  The right ventricular size is normal.  3. The  mitral valve is normal in structure. Trivial mitral valve regurgitation.  4. The aortic valve is tricuspid. Aortic valve regurgitation is not visualized.  5. The inferior vena cava is normal in size with greater than 50% respiratory variability, suggesting right atrial pressure of 3 mmHg. FINDINGS  Left Ventricle: Left ventricular ejection fraction, by estimation, is 50 to 55%. The left ventricle has normal function. The left ventricle has no regional wall motion abnormalities. The left ventricular internal cavity size was normal in size. There is  no left ventricular hypertrophy. Left ventricular diastolic parameters are consistent with Grade I diastolic dysfunction (impaired relaxation). Right Ventricle: The right ventricular size is normal. No increase in right ventricular wall thickness. Right ventricular systolic function is normal. Left Atrium: Left atrial size was normal in size. Right Atrium: Right atrial size was normal in size. Pericardium: There is no evidence of pericardial effusion. Mitral Valve: The mitral valve is normal in structure. Trivial mitral valve regurgitation. Tricuspid Valve: The tricuspid valve is normal in structure. Tricuspid valve regurgitation is trivial. Aortic Valve: The aortic valve is tricuspid. Aortic valve regurgitation is not visualized. Aortic valve mean gradient measures 3.0 mmHg. Aortic valve peak gradient measures 4.8 mmHg. Aortic valve area, by VTI measures 2.75 cm. Pulmonic Valve: The pulmonic valve was not well visualized. Pulmonic valve regurgitation is trivial. Aorta: The aortic root is normal in size and structure. Venous: The inferior vena cava is normal in size with greater than 50% respiratory variability, suggesting right atrial pressure of 3 mmHg. IAS/Shunts: No atrial level shunt detected by color flow Doppler.  LEFT VENTRICLE PLAX 2D LVIDd:         4.50 cm   Diastology LVIDs:         3.30 cm   LV e' medial:    5.44 cm/s LV PW:         0.80 cm   LV E/e' medial:   9.2 LV IVS:        1.00 cm   LV e' lateral:   7.72 cm/s LVOT diam:     2.20 cm   LV E/e' lateral: 6.5 LV SV:         58 LV SV Index:   33 LVOT Area:     3.80 cm  RIGHT VENTRICLE RV Basal diam:  2.90 cm RV Mid diam:    2.60 cm RV S prime:     12.10 cm/s TAPSE (M-mode): 2.2 cm LEFT ATRIUM             Index        RIGHT ATRIUM           Index LA diam:        2.00 cm 1.13 cm/m   RA Area:     12.20 cm LA Vol (A2C):   28.8 ml 16.34 ml/m  RA Volume:   27.30 ml  15.49 ml/m LA Vol (A4C):   15.7 ml 8.91 ml/m LA Biplane Vol: 21.9 ml 12.43 ml/m  AORTIC VALVE AV Area (Vmax):    2.62 cm AV Area (Vmean):   2.57 cm AV Area (VTI):     2.75 cm AV Vmax:           109.00 cm/s AV Vmean:          75.200 cm/s AV VTI:            0.210 m AV Peak Grad:      4.8  mmHg AV Mean Grad:      3.0 mmHg LVOT Vmax:         75.00 cm/s LVOT Vmean:        50.900 cm/s LVOT VTI:          0.152 m LVOT/AV VTI ratio: 0.72  AORTA Ao Root diam: 2.80 cm MITRAL VALVE               TRICUSPID VALVE MV Area (PHT): 7.22 cm    TR Peak grad:   16.8 mmHg MV Decel Time: 105 msec    TR Vmax:        205.00 cm/s MV E velocity: 50.30 cm/s MV A velocity: 94.90 cm/s  SHUNTS MV E/A ratio:  0.53        Systemic VTI:  0.15 m                            Systemic Diam: 2.20 cm Joetta Mustache Electronically signed by Joetta Mustache Signature Date/Time: 02/27/2023/4:37:07 PM    Final     Assessment & Plan:  .Hyperlipidemia LDL goal <160 -     Lipid panel; Future -     LDL cholesterol, direct; Future  Prediabetes -     Hemoglobin A1c; Future -     Comprehensive metabolic panel; Future -     Microalbumin / creatinine urine ratio; Future  Overweight Assessment & Plan: Discussed risks and benefits of pharmacotherapy,  she has wisely decided to lose weight with lifestyle changes. Dietary counselling for low gi diet  given with handouts.    Sciatica associated with disorder of lumbosacral spine Assessment & Plan: Her scia  ticpain has resolved.  She is  completing PT  and starting home exercises ,  walking and determined to lose weight      I provided 30 minutes of face-to-face time during this encounter reviewing patient's last visit with me, patient's   previous  labs and imaging studies, counseling on  dietary management of overweight ,   and post visit ordering to diagnostics and therapeutics .   Follow-up: Return in about 6 months (around 10/27/2023).   Thersia Flax, MD

## 2023-04-29 NOTE — Therapy (Signed)
 OUTPATIENT PHYSICAL THERAPY Treatment   Patient Name: Cynthia Ruiz MRN: [id number] DOB:[birth year], 58 y.o., female Today's Date: 04/29/2023  END OF SESSION:  PT End of Session - 04/29/23 0903     Visit Number 5    Number of Visits 25    Date for PT Re-Evaluation 06/23/23    Authorization Type BLUE CROSS BLUE SHIELD    Authorization - Number of Visits 30    Progress Note Due on Visit 10    PT Start Time 0903    PT Stop Time 0943    PT Time Calculation (min) 40 min    Behavior During Therapy [facility] for tasks assessed/performed                 Past Medical History:  Diagnosis Date   Anxiety    Past Surgical History:  Procedure Laterality Date   CESAREAN SECTION  1991, 2002, 2007   COLONOSCOPY WITH PROPOFOL  N/A 07/18/2015   Procedure: COLONOSCOPY WITH PROPOFOL ;  Surgeon: Marshall Skeeter, MD;  Location: [facility] ENDOSCOPY;  Service: Endoscopy;  Laterality: N/A;   COLONOSCOPY WITH PROPOFOL  N/A 08/09/2021   Procedure: COLONOSCOPY WITH PROPOFOL ;  Surgeon: Luke Salaam, MD;  Location: [facility] ENDOSCOPY;  Service: Gastroenterology;  Laterality: N/A;   KNEE ARTHROSCOPY Right 1991   Pyloric Stenosis surgery     TUBAL LIGATION  2007   Patient Active Problem List   Diagnosis Date Noted   Palpitations 01/22/2023   Caregiver with fatigue 01/21/2023   Cystitis 10/10/2022   Cerumen impaction 10/10/2022   Paroxysmal supraventricular tachycardia (HCC) 08/04/2021   Witnessed apneic spells 08/02/2021   Internal hemorrhoids without complication 05/13/2021   Gastroesophageal reflux disease 05/13/2021   Left shoulder pain 02/02/2021   Subclinical hypothyroidism 02/02/2021   Hyperlipidemia LDL goal <160 01/30/2021   Insomnia 07/24/2020   Wears hearing aid 07/20/2020   Sciatica associated with disorder of lumbosacral spine 09/25/2019   Coccygeal pain, chronic 02/23/2019   Encounter for screening laboratory testing for COVID-19 virus 01/29/2019   Constipation 01/29/2019   Right shoulder  pain 07/01/2016   Anxiety as acute reaction to gross stress 02/12/2016   Encounter for screening colonoscopy 06/07/2015   Overweight 05/21/2015   Positive colorectal cancer screening using Cologuard test 06/20/2014   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   Rosacea 02/14/2013   Routine general medical examination at a health care facility 02/14/2013    PCP: Thersia Flax, MD  REFERRING PROVIDER: Thersia Flax, MD  REFERRING DIAG: [phone] (ICD-10-CM) - Sciatica associated with disorder of lumbosacral spine  Rationale for Evaluation and Treatment: Rehabilitation  THERAPY DIAG:  Acute bilateral low back pain with bilateral sciatica  Decreased ROM of lumbar spine  ONSET DATE: 03/19/2023  SUBJECTIVE:  SUBJECTIVE STATEMENT:  Back is doing ok. 1/10 low back pain currently. The seated trunk extension made her neck sore afterwards, fine the next day. Has a follow up with Dr Madelon Scheuermann today. Gets sore in her low back when she does stuff like picking up toys. Standing still to prepare food in the kitchen or sitting for too long bothers her low back.      PERTINENT HISTORY:   Pt is being seen for low back pain.  Pt notes that she Sunday prior to the increased pain, she was bending over to color her hair for a prolonged period of time.  Pt then was attempted to put the manger scene below the Christmas tree.  Pt notes that she is unsure of when it specifically occurred, but she went to bed and basically any movement caused excruciating pain.  Pt notes that she started to experience bilateral sciatica shortly following.  Pt went to see Dr. Madelon Scheuermann and received medications that have helped the pain.  Pt notes that she may have agitated it yesterday by picking up and doing things.  Pt supports that she does not know how to  sit down.  Pt otherwise has noted a reduction with the pain medications.     No latex allergies.     PAIN: Are you having pain? Yes: NPRS scale: 3/10 Pain location: lower back  Pain description: throbbing Aggravating factors: bending, lifting, squatting, sitting Relieving factors: medications, heating pad Worst Pain over 7 Days: 7/10 Best Pain over 7 Days: 2/10  PRECAUTIONS: None  RED FLAGS: None   WEIGHT BEARING RESTRICTIONS: No  FALLS:  Has patient fallen in last 6 months? No  LIVING ENVIRONMENT: Lives with: lives with their family Lives in: House/apartment Stairs: Yes: Internal: 13 steps; on right going up Has following equipment at home:  None  OCCUPATION: Teacher at [employer]  PLOF: Independent  PATIENT GOALS: Strengthen core for lumbar stability, get back in shape.  NEXT MD VISIT: 04/29/23  OBJECTIVE:  Note: Objective measures were completed at Evaluation unless otherwise noted.  DIAGNOSTIC FINDINGS:  X-Ray performed on 03/26/23, findings not in yet  PATIENT SURVEYS:  FOTO 38/68  COGNITION: Overall cognitive status: Within functional limits for tasks assessed     SENSATION: WFL  POSTURE: No Significant postural limitations  PALPATION: Pt with significant pain in the L3 and L5 lumbar region upon performing PA's to the region.  Pt also having increased pain with sacrum provocation as well.  Bilateral piriformis is tender, however L>R.  LUMBAR ROM:    AROM 04/02/2023  Flexion WFL with B LE (sciatic nerve tightness)  Extension WFL with L5/S1 area pain and hinging. L4 area hinging as well   Right lateral flexion WFL with L thoracic pain with overpressure  Left lateral flexion WFL with L4 area pinching/shooting pain  Right rotation WFL with L4-S1 area shooting pain  Left rotation WFL with L4-S1 area symptoms, but not as bad   (Blank rows = not tested)   LOWER EXTREMITY ROM:     Active  Right eval Left eval  Hip flexion 4+ 4-  Hip extension     Hip abduction 4 4-  Hip adduction 4 4-  Hip internal rotation    Hip external rotation    Knee flexion 4- 4-  Knee extension 4- 4-  Ankle dorsiflexion 5 5   (Blank rows = not tested)  LOWER EXTREMITY MMT:    MMT Right eval Left eval  Hip flexion 4+ 4-  Hip  abduction 4 4-  Hip adduction 4 4-  Knee flexion 4- 4-  Knee extension 4- 4-  Ankle dorsiflexion 5 5   (Blank rows = not tested)  LUMBAR SPECIAL TESTS:  Straight leg raise test: Negative, Slump test: Positive, Stork standing: Negative, SI Compression/distraction test: Positive, and Thomas test: Positive  (+) repeated flexion test     FUNCTIONAL TESTS:  5 times sit to stand: 12.53 sec   TODAY'S TREATMENT: DATE: 04/29/23  Posture: R lumbar convexity around L 4 TTP L4/L5 area  Therapeutic exercise  Sitting with lumbar towel roll. Increased low back pain  Standing B shoulder extension yellow band 10x5 seconds   Then red band 10x5 seconds for 2 sets   Decreased B lumbar paraspinal muscle tension palpated  Seated manually resisted R upper trunk rotation isometrics in neutral to promote L lumbar rotation posture and a more neutral lumbar position 10x3 with 5 second holds   Static standing mini lunge with contralateral UE assist   Pain free range of motion  R 10x3  L 10x3   Standing PNF chops to the R with red band 10x5 seconds for 2 sets  Standing open books at corner wall to promote thoracic extension and decrease stress to low back 10x5 seconds for 3 sets      Improved exercise technique, movement at target joints, use of target muscles after mod verbal, visual, tactile cues.        PATIENT EDUCATION:  Education details: Pt educated on role of PT and services provided during current POC, along with prognosis and information about the clinic. Person educated: Patient Education method: Explanation, Demonstration, and Tactile cues Education comprehension: verbalized understanding  HOME EXERCISE  PROGRAM:  Access Code: [email or web address] URL: [email or web address] Date: 03/31/2023 Prepared by: Dawson Europe  Exercises   Held off on 04/02/2023 due to pain in supine position  - Supine Pelvic Tilt  - 1 x daily - 7 x weekly - 3 sets - 10 reps - Supine Bridge  - 1 x daily - 7 x weekly - 3 sets - 10 reps - Supine Lower Trunk Rotation  - 1 x daily - 7 x weekly - 3 sets - 10 reps  - Clamshell  - 1 x daily - 7 x weekly - 3 sets - 10 reps    Added on 04/02/2023  Access Code: [email or web address] URL: [email or web address] Date: 04/02/2023 Prepared by: Suzzane Estes  Exercises - Seated Scapular Retraction  - 3 x daily - 7 x weekly - 3 sets - 10 reps - 5 seconds hold  - Single Arm Shoulder Extension with Anchored Resistance  - 1 x daily - 7 x weekly - 2-3 sets - 10 reps - 5 seconds hold - Seated Transversus Abdominis Bracing  - 5 x daily - 7 x weekly - 3 sets - 10 reps - 5 seconds hold  - Partial Lunge with Chair  - 1 x daily - 7 x weekly - 3 sets - 10 reps   ASSESSMENT:  Continued working on improving thoracic extension, glute strength, posture, and trunk muscle activation, to help decrease stress to low back. Pt tolerated session well without aggravation of symptoms.  Pt will benefit from continued skilled physical therapy services to decrease pain, improve strength and function.     CLINICAL IMPRESSION:   OBJECTIVE IMPAIRMENTS: decreased activity tolerance, decreased endurance, decreased knowledge of condition, decreased mobility, difficulty walking, decreased ROM, decreased strength, hypomobility, and pain.   ACTIVITY LIMITATIONS: carrying, lifting, bending, sitting, standing, squatting,  stairs, and locomotion level  PARTICIPATION LIMITATIONS: cleaning, laundry, shopping, community activity, occupation, and yard work  PERSONAL FACTORS: Fitness, Past/current experiences, Time since onset of injury/illness/exacerbation, and 1-2 comorbidities: Shoulder pain,  hearing difficulty, coccygeal pain  are also affecting patient's functional outcome.   REHAB POTENTIAL: Good  CLINICAL DECISION MAKING: Stable/uncomplicated  EVALUATION COMPLEXITY: Low   GOALS: Goals reviewed with patient? Yes  SHORT TERM GOALS: Target date: 04/28/2023  Pt will be independent with HEP in order to demonstrate increased ability to perform tasks related to occupation/hobbies. Baseline:  Pt given HEP at initial evaluation Goal status: INITIAL  LONG TERM GOALS: Target date: 06/23/2023  Patient will demonstrate improved function as evidenced by a score of 68 on FOTO measure for full participation in activities at home and in the community. Baseline: FOTO: 38 Goal status: INITIAL  2.  Pt will independently ascend and descend one full flight of stairs (12-15 steps) using a handrail, with pain reported <= 3/10 to improve functional mobility and independence. Baseline: Pt only able to ascend 2-3 steps with 5/10 pain on medication.   Goal status: INITIAL  3.  Patient will complete five times sit to stand test in < 12 seconds indicating an increased LE strength and improved balance. Baseline: 12.53 sec Goal status: INITIAL   PLAN:  PT FREQUENCY: 2x/week  PT DURATION: 12 weeks  PLANNED INTERVENTIONS: 97164- PT Re-evaluation, 97110-Therapeutic exercises, 97530- Therapeutic activity, 97112- Neuromuscular re-education, 97535- Self Care, 16109- Manual therapy, [id number]- Aquatic Therapy, 97035- Ultrasound, [id number]- Traction (mechanical), Balance training, Taping, Dry Needling, Joint mobilization, Joint manipulation, Spinal manipulation, Spinal mobilization, Cryotherapy, and Moist heat.  PLAN FOR NEXT SESSION: Check for leg length discrepancy and pelvic assessment due to positive pelvic tests.  Improve core strengthening as well for lumbar stability.   Suzzane Estes PT, DPT   Physical Therapist - [facility]  04/29/23, 11:49 AM

## 2023-04-29 NOTE — Assessment & Plan Note (Signed)
 Her scia  ticpain has resolved.  She is completing PT  and starting home exercises ,  walking and determined to lose weight

## 2023-04-29 NOTE — Assessment & Plan Note (Signed)
 Discussed risks and benefits of pharmacotherapy,  she has wisely decided to lose weight with lifestyle changes. Dietary counselling for low gi diet  given with handouts.

## 2023-05-01 ENCOUNTER — Ambulatory Visit: Payer: BC Managed Care – PPO

## 2023-05-01 DIAGNOSIS — M5386 Other specified dorsopathies, lumbar region: Secondary | ICD-10-CM | POA: Diagnosis not present

## 2023-05-01 DIAGNOSIS — M5442 Lumbago with sciatica, left side: Secondary | ICD-10-CM | POA: Diagnosis not present

## 2023-05-01 DIAGNOSIS — M5441 Lumbago with sciatica, right side: Secondary | ICD-10-CM | POA: Diagnosis not present

## 2023-05-01 NOTE — Therapy (Signed)
OUTPATIENT PHYSICAL THERAPY Treatment   Patient Name: Cynthia Ruiz MRN: [id number] DOB:1965/09/27, 58 y.o., female Today's Date: 05/01/2023  END OF SESSION:  PT End of Session - 05/01/23 0912     Visit Number 6    Number of Visits 25    Date for PT Re-Evaluation 06/23/23    Authorization Type BLUE CROSS BLUE SHIELD    Authorization - Number of Visits 30    Progress Note Due on Visit 10    PT Start Time 0912    PT Stop Time 0950    PT Time Calculation (min) 38 min    Behavior During Therapy [facility] for tasks assessed/performed                  Past Medical History:  Diagnosis Date   Anxiety    Past Surgical History:  Procedure Laterality Date   CESAREAN SECTION  1991, 2002, 2007   COLONOSCOPY WITH PROPOFOL N/A 07/18/2015   Procedure: COLONOSCOPY WITH PROPOFOL;  Surgeon: Earline Mayotte, MD;  Location: [facility] ENDOSCOPY;  Service: Endoscopy;  Laterality: N/A;   COLONOSCOPY WITH PROPOFOL N/A 08/09/2021   Procedure: COLONOSCOPY WITH PROPOFOL;  Surgeon: [name], MD;  Location: [facility] ENDOSCOPY;  Service: Gastroenterology;  Laterality: N/A;   KNEE ARTHROSCOPY Right 1991   Pyloric Stenosis surgery     TUBAL LIGATION  2007   Patient Active Problem List   Diagnosis Date Noted   Palpitations 01/22/2023   Caregiver with fatigue 01/21/2023   Cystitis 10/10/2022   Cerumen impaction 10/10/2022   Paroxysmal supraventricular tachycardia (HCC) 08/04/2021   Witnessed apneic spells 08/02/2021   Internal hemorrhoids without complication 05/13/2021   Gastroesophageal reflux disease 05/13/2021   Left shoulder pain 02/02/2021   Subclinical hypothyroidism 02/02/2021   Hyperlipidemia LDL goal <160 01/30/2021   Insomnia 07/24/2020   Wears hearing aid 07/20/2020   Sciatica associated with disorder of lumbosacral spine 09/25/2019   Coccygeal pain, chronic 02/23/2019   Encounter for screening laboratory testing for COVID-19 virus 01/29/2019   Constipation 01/29/2019   Right shoulder  pain 07/01/2016   Anxiety as acute reaction to gross stress 02/12/2016   Encounter for screening colonoscopy 06/07/2015   Overweight 05/21/2015   Positive colorectal cancer screening using Cologuard test 06/20/2014   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   Rosacea 02/14/2013   Routine general medical examination at a health care facility 02/14/2013    PCP: Sherlene Shams, MD  REFERRING PROVIDER: Sherlene Shams, MD  REFERRING DIAG: [phone] (ICD-10-CM) - Sciatica associated with disorder of lumbosacral spine  Rationale for Evaluation and Treatment: Rehabilitation  THERAPY DIAG:  Acute bilateral low back pain with bilateral sciatica  Decreased ROM of lumbar spine  ONSET DATE: 03/19/2023  SUBJECTIVE:  SUBJECTIVE STATEMENT: Back is doing ok, no pain currently. Was fine after last session. 5/10 back pain at worst for the past 7 days without pain medication.      PERTINENT HISTORY:   Pt is being seen for low back pain.  Pt notes that she Sunday prior to the increased pain, she was bending over to color her hair for a prolonged period of time.  Pt then was attempted to put the manger scene below the Christmas tree.  Pt notes that she is unsure of when it specifically occurred, but she went to bed and basically any movement caused excruciating pain.  Pt notes that she started to experience bilateral sciatica shortly following.  Pt went to see Dr. Darrick Huntsman and received medications that have helped the pain.  Pt notes that she may have agitated it yesterday by picking up and doing things.  Pt supports that she does not know how to sit down.  Pt otherwise has noted a reduction with the pain medications.     No latex allergies.     PAIN: Are you having pain? Yes: NPRS scale: 3/10 Pain location: lower back   Pain description: throbbing Aggravating factors: bending, lifting, squatting, sitting Relieving factors: medications, heating pad Worst Pain over 7 Days: 7/10 Best Pain over 7 Days: 2/10  PRECAUTIONS: None  RED FLAGS: None   WEIGHT BEARING RESTRICTIONS: No  FALLS:  Has patient fallen in last 6 months? 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  NEXT MD VISIT: 04/29/23  OBJECTIVE:  Note: Objective measures were completed at Evaluation unless otherwise noted.  DIAGNOSTIC FINDINGS:  X-Ray performed on 03/26/23, findings not in yet  PATIENT SURVEYS:  FOTO 38/68  COGNITION: Overall cognitive status: Within functional limits for tasks assessed     SENSATION: WFL  POSTURE: No Significant postural limitations  PALPATION: Pt with significant pain in the L3 and L5 lumbar region upon performing PA's to the region.  Pt also having increased pain with sacrum provocation as well.  Bilateral piriformis is tender, however L>R.  LUMBAR ROM:    AROM 04/02/2023  Flexion WFL with B LE (sciatic nerve tightness)  Extension WFL with L5/S1 area pain and hinging. L4 area hinging as well   Right lateral flexion WFL with L thoracic pain with overpressure  Left lateral flexion WFL with L4 area pinching/shooting pain  Right rotation WFL with L4-S1 area shooting pain  Left rotation WFL with L4-S1 area symptoms, but not as bad   (Blank rows = not tested)   LOWER EXTREMITY ROM:     Active  Right eval Left eval  Hip flexion 4+ 4-  Hip extension    Hip abduction 4 4-  Hip adduction 4 4-  Hip internal rotation    Hip external rotation    Knee flexion 4- 4-  Knee extension 4- 4-  Ankle dorsiflexion 5 5   (Blank rows = not  tested)  LOWER EXTREMITY MMT:    MMT Right eval Left eval  Hip flexion 4+ 4-  Hip abduction 4 4-  Hip adduction 4 4-  Knee flexion 4- 4-  Knee extension 4- 4-  Ankle dorsiflexion 5 5   (Blank rows = not tested)  LUMBAR SPECIAL TESTS:  Straight leg  raise test: Negative, Slump test: Positive, Stork standing: Negative, SI Compression/distraction test: Positive, and Thomas test: Positive  (+) repeated flexion test     FUNCTIONAL TESTS:  5 times sit to stand: 12.53 sec   TODAY'S TREATMENT: DATE: 05/01/23  Posture: R lumbar convexity around L 4 TTP L4/L5 area  Therapeutic exercise  Ascending and descending 4 regular steps with R rail going up, L rail going down.  4x (16 steps total), no pain  Standing with light touch assist B  SLS     With gentle contralateral leg swings, emphasis on level pelvis    R 10x, then 5x    L 10x, then 5x        Low back discomfort  Seated trunk flexion 10x5 seconds   Standing bent over onto mat table  Hip extension, knee bent   R 1x    R posterior leg cramp   Seated manually resisted R upper trunk rotation isometrics in neutral to promote L lumbar rotation posture and a more neutral lumbar position 10x3 with 5 second holds  Seated B scapular retraction to promote thoracic extension and decrease stress to low back  10x5 seconds for 2 sets   Seated trunk flexion 10x5 seconds   Standing hip abduction with B UE assist   R 10x2  L 10x2  Standing B shoulder extension  red band 10x5 seconds for 3 sets    Standing PNF chops to the R with red band 10x5 seconds      Improved exercise technique, movement at target joints, use of target muscles after mod verbal, visual, tactile cues.        PATIENT EDUCATION:  Education details: Pt educated on role of PT and services provided during current POC, along with prognosis and information about the clinic. Person educated: Patient Education method: Explanation, Demonstration, and  Tactile cues Education comprehension: verbalized understanding  HOME EXERCISE PROGRAM:  Access Code: [email or web address] URL: [email or web address] Date: 03/31/2023 Prepared by: Tomasa Hose  Exercises   Held off on 04/02/2023 due to pain in supine position  - Supine Pelvic Tilt  - 1 x daily - 7 x weekly - 3 sets - 10 reps - Supine Bridge  - 1 x daily - 7 x weekly - 3 sets - 10 reps - Supine Lower Trunk Rotation  - 1 x daily - 7 x weekly - 3 sets - 10 reps  - Clamshell  - 1 x daily - 7 x weekly - 3 sets - 10 reps    Added on 04/02/2023  Access Code: [email or web address] URL: [email or web address] Date: 04/02/2023 Prepared by: Loralyn Freshwater  Exercises - Seated Scapular Retraction  - 3 x daily - 7 x weekly - 3 sets - 10 reps - 5 seconds hold  - Single Arm Shoulder Extension with Anchored Resistance  - 1 x daily - 7 x weekly - 2-3 sets - 10 reps - 5 seconds hold - Seated Transversus Abdominis Bracing  - 5 x daily - 7 x weekly - 3 sets - 10 reps - 5 seconds hold  - Partial Lunge with Chair  - 1 x daily - 7 x weekly - 3 sets - 10 reps   ASSESSMENT:   Able to ascend and descend 4 regular steps with R rail assist 4x (totaling 16 steps) without back pain and without pain medications. Demonstrates B closed chain glute med weakness observed and worked on [employer] med strengthening to help address. Continued working on improving thoracic extension, glute strength, posture,  and trunk muscle activation, to help decrease stress to low back. Pt will benefit from continued skilled physical therapy services to decrease pain, improve strength and function.     CLINICAL IMPRESSION:   OBJECTIVE IMPAIRMENTS: decreased activity tolerance, decreased endurance, decreased knowledge of condition, decreased mobility, difficulty walking, decreased ROM, decreased strength, hypomobility, and pain.   ACTIVITY LIMITATIONS: carrying, lifting, bending, sitting, standing, squatting, stairs, and  locomotion level  PARTICIPATION LIMITATIONS: cleaning, laundry, shopping, community activity, occupation, and yard work  PERSONAL FACTORS: Fitness, Past/current experiences, Time since onset of injury/illness/exacerbation, and 1-2 comorbidities: Shoulder pain, hearing difficulty, coccygeal pain  are also affecting patient's functional outcome.   REHAB POTENTIAL: Good  CLINICAL DECISION MAKING: Stable/uncomplicated  EVALUATION COMPLEXITY: Low   GOALS: Goals reviewed with patient? Yes  SHORT TERM GOALS: Target date: 04/28/2023  Pt will be independent with HEP in order to demonstrate increased ability to perform tasks related to occupation/hobbies. Baseline:  Pt given HEP at initial evaluation Goal status: INITIAL  LONG TERM GOALS: Target date: 06/23/2023  Patient will demonstrate improved function as evidenced by a score of 68 on FOTO measure for full participation in activities at home and in the community. Baseline: FOTO: 38 Goal status: INITIAL  2.  Pt will independently ascend and descend one full flight of stairs (12-15 steps) using a handrail, with pain reported <= 3/10 to improve functional mobility and independence. Baseline: Pt only able to ascend 2-3 steps with 5/10 pain on medication.   Goal status: INITIAL  3.  Patient will complete five times sit to stand test in < 12 seconds indicating an increased LE strength and improved balance. Baseline: 12.53 sec Goal status: INITIAL   PLAN:  PT FREQUENCY: 2x/week  PT DURATION: 12 weeks  PLANNED INTERVENTIONS: 97164- PT Re-evaluation, 97110-Therapeutic exercises, 97530- Therapeutic activity, 97112- Neuromuscular re-education, 97535- Self Care, 16109- Manual therapy, [id number]- Aquatic Therapy, 97035- Ultrasound, [id number]- Traction (mechanical), Balance training, Taping, Dry Needling, Joint mobilization, Joint manipulation, Spinal manipulation, Spinal mobilization, Cryotherapy, and Moist heat.  PLAN FOR NEXT SESSION: Check for leg  length discrepancy and pelvic assessment due to positive pelvic tests.  Improve core strengthening as well for lumbar stability.   Loralyn Freshwater PT, DPT   Physical Therapist - [facility]  05/01/23, 9:52 AM

## 2023-05-04 ENCOUNTER — Ambulatory Visit: Payer: BC Managed Care – PPO

## 2023-05-04 ENCOUNTER — Encounter: Payer: Self-pay | Admitting: Internal Medicine

## 2023-05-04 DIAGNOSIS — Z79899 Other long term (current) drug therapy: Secondary | ICD-10-CM

## 2023-05-06 ENCOUNTER — Ambulatory Visit: Payer: BC Managed Care – PPO

## 2023-05-11 ENCOUNTER — Ambulatory Visit: Payer: BC Managed Care – PPO

## 2023-07-02 ENCOUNTER — Other Ambulatory Visit: Payer: Self-pay | Admitting: Internal Medicine

## 2023-10-26 ENCOUNTER — Encounter: Payer: Self-pay | Admitting: Internal Medicine

## 2023-10-26 ENCOUNTER — Other Ambulatory Visit: Payer: Self-pay | Admitting: Internal Medicine

## 2023-10-26 ENCOUNTER — Telehealth: Admitting: Internal Medicine

## 2023-10-26 VITALS — Ht 63.0 in | Wt 144.5 lb

## 2023-10-26 DIAGNOSIS — L659 Nonscarring hair loss, unspecified: Secondary | ICD-10-CM

## 2023-10-26 DIAGNOSIS — L658 Other specified nonscarring hair loss: Secondary | ICD-10-CM

## 2023-10-26 MED ORDER — CELECOXIB 200 MG PO CAPS: 200.0000 mg | ORAL_CAPSULE | Freq: Two times a day (BID) | ORAL | 0 refills | Status: DC

## 2023-10-26 NOTE — Assessment & Plan Note (Signed)
 Likely related to physical and emotional stressors,  but will check Vit D, iron,  testosterone, DHEA-s and thyroid 

## 2023-10-26 NOTE — Progress Notes (Signed)
 Virtual Visit via Caregility   Note   This format is felt to be most appropriate for this patient at this time.  All issues noted in this document were discussed and addressed.  No physical exam was performed (except for noted visual exam findings with Video Visits).   I connected with Cynthia Ruiz  on 10/26/23 at  1:00 PM EDT by a video enabled telemedicine application or telephone and verified that I am speaking with the correct person using two identifiers. Location patient: home Location provider: work or home office Persons participating in the virtual visit: patient, provider  I discussed the limitations, risks, security and privacy concerns of performing an evaluation and management service by telephone and the availability of in person appointments. I also discussed with the patient that there may be a patient responsible charge related to this service. The patient expressed understanding and agreed to proceed.  Reason for visit: hair loss  HPI:   58 yr old female presents with increased hair loss noted by hair stylist today .  Patient denies any recent illness but has lost 20 lbs intentionally since January through diet and exercise, and has had increased emotional stressors as primary caregiver for child with Bethel',  sister with MS, and husband who is now in [place] at the bedside of his dying mother.  Thre are no bald spots or receding hairline issues   ROS: See pertinent positives and negatives per HPI.  Past Medical History:  Diagnosis Date   Anxiety     Past Surgical History:  Procedure Laterality Date   CESAREAN SECTION  1991, 2002, 2007   COLONOSCOPY WITH PROPOFOL  N/A 07/18/2015   Procedure: COLONOSCOPY WITH PROPOFOL ;  Surgeon: Reyes LELON Cota, MD;  Location: [facility] ENDOSCOPY;  Service: Endoscopy;  Laterality: N/A;   COLONOSCOPY WITH PROPOFOL  N/A 08/09/2021   Procedure: COLONOSCOPY WITH PROPOFOL ;  Surgeon: Therisa Bi, MD;  Location: [facility] ENDOSCOPY;  Service:  Gastroenterology;  Laterality: N/A;   KNEE ARTHROSCOPY Right 1991   Pyloric Stenosis surgery     TUBAL LIGATION  2007    Family History  Problem Relation Age of Onset   Arthritis Mother 11       rheumatoid arthritis - died 7   Hypertension Father    Glaucoma Father    Cancer Father 57       multiple colon polyps,  not CA    Multiple sclerosis Sister 70   Diabetes Paternal Grandfather    Down syndrome Daughter    Cancer Maternal Grandmother        bile duct   Cancer Paternal Grandmother        unsure what type   Colon cancer Maternal Uncle    Breast cancer Neg Hx     SOCIAL HX:  reports that she has never smoked. She has never used smokeless tobacco. She reports that she does not currently use alcohol. She reports that she does not use drugs.    Current Outpatient Medications:    omeprazole  (PRILOSEC) 40 MG capsule, TAKE 1 CAPSULE (40 MG TOTAL) BY MOUTH DAILY. (Patient taking differently: Take 40 mg by mouth as needed.), Disp: 90 capsule, Rfl: 1   celecoxib  (CELEBREX ) 200 MG capsule, Take 1 capsule (200 mg total) by mouth 2 (two) times daily., Disp: 60 capsule, Rfl: 0  EXAM:  VITALS per patient if applicable:  GENERAL: alert, oriented, appears well and in no acute distress  HEENT: atraumatic, conjunttiva clear, no obvious abnormalities on inspection of external nose and ears  NECK: normal movements of the head and neck  LUNGS: on inspection no signs of respiratory distress, breathing rate appears normal, no obvious gross SOB, gasping or wheezing  CV: no obvious cyanosis  MS: moves all visible extremities without noticeable abnormality  PSYCH/NEURO: pleasant and cooperative, no obvious depression or anxiety, speech and thought processing grossly intact  ASSESSMENT AND PLAN: Nonscarring hair loss Assessment & Plan: Likely related to physical and emotional stressors,  but will check Vit D, iron,  testosterone, DHEA-s and thyroid     Other orders -     Celecoxib ;  Take 1 capsule (200 mg total) by mouth 2 (two) times daily.  Dispense: 60 capsule; Refill: 0      I discussed the assessment and treatment plan with the patient. The patient was provided an opportunity to ask questions and all were answered. The patient agreed with the plan and demonstrated an understanding of the instructions.   The patient was advised to call back or seek an in-person evaluation if the symptoms worsen or if the condition fails to improve as anticipated.   I spent 20 minutes dedicated to the care of this patient on the date of this virtual encounter to include pre-visit review of patient's medical history,  including recent labs, face-to-face time with the patient , and post visit ordering of testing and therapeutics.    Verneita LITTIE Kettering, MD

## 2023-10-27 ENCOUNTER — Ambulatory Visit (INDEPENDENT_AMBULATORY_CARE_PROVIDER_SITE_OTHER): Payer: Self-pay | Admitting: Podiatry

## 2023-10-27 ENCOUNTER — Encounter: Payer: Self-pay | Admitting: Podiatry

## 2023-10-27 VITALS — Ht 63.0 in | Wt 144.5 lb

## 2023-10-27 DIAGNOSIS — D2372 Other benign neoplasm of skin of left lower limb, including hip: Secondary | ICD-10-CM

## 2023-10-27 NOTE — Progress Notes (Signed)
   Chief Complaint  Patient presents with   Plantar Warts    Pt is here due to warts on the bottom of here left foot states they have been there for a few weeks and are painful to walk on, left great toenail is bothering her as well toe is painful to touch, states when she was 13 she had an ingrown and nail was removed and never grow back entirely.     Subjective: 58 y.o. female presenting to the office today for 2 separate complaints.  First the patient states that she has developed a symptomatic skin lesion to the plantar aspect of the left forefoot.  Onset for few weeks now.  She also has a long history of toenail complications to the left hallux.  Most recently it has become very symptomatic and tender and painful.   Past Medical History:  Diagnosis Date   Anxiety     Past Surgical History:  Procedure Laterality Date   CESAREAN SECTION  1991, 2002, 2007   COLONOSCOPY WITH PROPOFOL  N/A 07/18/2015   Procedure: COLONOSCOPY WITH PROPOFOL ;  Surgeon: Reyes LELON Cota, MD;  Location: [facility] ENDOSCOPY;  Service: Endoscopy;  Laterality: N/A;   COLONOSCOPY WITH PROPOFOL  N/A 08/09/2021   Procedure: COLONOSCOPY WITH PROPOFOL ;  Surgeon: Therisa Bi, MD;  Location: [facility] ENDOSCOPY;  Service: Gastroenterology;  Laterality: N/A;   KNEE ARTHROSCOPY Right 1991   Pyloric Stenosis surgery     TUBAL LIGATION  2007    No Known Allergies   Objective:  Physical Exam General: Alert and oriented x3 in no acute distress  Dermatology: Hyperkeratotic lesion(s) present on the plantar aspect of the left forefoot. Pain on palpation with a central nucleated core noted. Skin is warm, dry and supple bilateral lower extremities. Negative for open lesions or macerations. Hyperkeratotic dystrophic nail noted to the left hallux nail plate with incurvated nails and ingrowing portions of toenail  Vascular: Palpable pedal pulses bilaterally. No edema or erythema noted. Capillary refill within normal  limits.  Neurological: Grossly intact via light touch  Musculoskeletal Exam: Pain on palpation at the keratotic lesion(s) noted. Range of motion within normal limits bilateral. Muscle strength 5/5 in all groups bilateral.  Assessment: 1.  Eccrine poroma left forefoot 2.  Ingrowing toenail medial and lateral border of the left hallux nail plate   Plan of Care:  -Patient evaluated -Mechanical debridement of the nail was performed today using a nail nipper.  Patient felt relief -Excisional debridement of keratoic lesion(s) using a chisel blade was performed without incident.  -Salicylic acid applied with a bandaid -Return to the clinic PRN.   Thresa EMERSON Sar, DPM [facility]  Dr. Thresa EMERSON Sar, DPM    [address] [place], [place] [address]                Office [phone]  Fax [phone]

## 2023-10-28 ENCOUNTER — Other Ambulatory Visit (INDEPENDENT_AMBULATORY_CARE_PROVIDER_SITE_OTHER)

## 2023-10-28 DIAGNOSIS — L659 Nonscarring hair loss, unspecified: Secondary | ICD-10-CM

## 2023-10-28 DIAGNOSIS — R7303 Prediabetes: Secondary | ICD-10-CM

## 2023-10-28 DIAGNOSIS — Z79899 Other long term (current) drug therapy: Secondary | ICD-10-CM

## 2023-10-28 DIAGNOSIS — E785 Hyperlipidemia, unspecified: Secondary | ICD-10-CM | POA: Diagnosis not present

## 2023-10-28 DIAGNOSIS — L658 Other specified nonscarring hair loss: Secondary | ICD-10-CM | POA: Diagnosis not present

## 2023-10-28 LAB — TSH: TSH: 4.11 u[IU]/mL (ref 0.35–5.50)

## 2023-10-28 LAB — COMPREHENSIVE METABOLIC PANEL WITH GFR
ALT: 11 U/L (ref 0–35)
AST: 18 U/L (ref 0–37)
Albumin: 4.4 g/dL (ref 3.5–5.2)
Alkaline Phosphatase: 71 U/L (ref 39–117)
BUN: 20 mg/dL (ref 6–23)
CO2: 29 meq/L (ref 19–32)
Calcium: 9.4 mg/dL (ref 8.4–10.5)
Chloride: 104 meq/L (ref 96–112)
Creatinine, Ser: 0.81 mg/dL (ref 0.40–1.20)
GFR: 79.19 mL/min (ref >60.00)
Glucose, Bld: 87 mg/dL (ref 70–99)
Potassium: 4.9 meq/L (ref 3.5–5.1)
Sodium: 139 meq/L (ref 135–145)
Total Bilirubin: 0.5 mg/dL (ref 0.2–1.2)
Total Protein: 7.3 g/dL (ref 6.0–8.3)

## 2023-10-28 LAB — LIPID PANEL
Cholesterol: 137 mg/dL (ref 0–200)
HDL: 57.5 mg/dL (ref >39.00)
LDL Cholesterol: 67 mg/dL (ref 0–99)
NonHDL: 79.38
Total CHOL/HDL Ratio: 2
Triglycerides: 62 mg/dL (ref 0.0–149.0)
VLDL: 12.4 mg/dL (ref 0.0–40.0)

## 2023-10-28 LAB — VITAMIN D 25 HYDROXY (VIT D DEFICIENCY, FRACTURES): VITD: 43.1 ng/mL (ref 30.00–100.00)

## 2023-10-28 LAB — HEMOGLOBIN A1C: Hgb A1c MFr Bld: 5.9 % (ref 4.6–6.5)

## 2023-10-28 LAB — LDL CHOLESTEROL, DIRECT: Direct LDL: 68 mg/dL

## 2023-10-28 LAB — MICROALBUMIN / CREATININE URINE RATIO
Creatinine,U: 154.4 mg/dL
Microalb Creat Ratio: 5.4 mg/g (ref 0.0–30.0)
Microalb, Ur: 0.8 mg/dL (ref 0.0–1.9)

## 2023-11-03 LAB — IRON,TIBC AND FERRITIN PANEL
%SAT: 32 % (ref 16–45)
Ferritin: 65 ng/mL (ref 16–232)
Iron: 88 ug/dL (ref 45–160)
TIBC: 275 ug/dL (ref 250–450)

## 2023-11-03 LAB — TESTOS,TOTAL,FREE AND SHBG (FEMALE)
Free Testosterone: 1.3 pg/mL (ref 0.1–6.4)
Sex Hormone Binding: 84 nmol/L — ABNORMAL HIGH (ref 14–73)
Testosterone, Total, LC-MS-MS: 13 ng/dL (ref 2–45)

## 2023-11-03 LAB — DHEA-SULFATE: DHEA-SO4: 91 ug/dL (ref 5–167)

## 2023-11-04 ENCOUNTER — Ambulatory Visit: Admitting: Internal Medicine

## 2023-11-04 ENCOUNTER — Ambulatory Visit: Payer: Self-pay | Admitting: Internal Medicine

## 2023-11-11 ENCOUNTER — Encounter: Payer: Self-pay | Admitting: Internal Medicine

## 2023-11-11 DIAGNOSIS — L658 Other specified nonscarring hair loss: Secondary | ICD-10-CM | POA: Insufficient documentation

## 2023-11-16 ENCOUNTER — Encounter: Payer: Self-pay | Admitting: Internal Medicine

## 2023-11-17 ENCOUNTER — Encounter: Payer: Self-pay | Admitting: Internal Medicine

## 2023-11-19 NOTE — Telephone Encounter (Signed)
 noted

## 2023-12-05 ENCOUNTER — Other Ambulatory Visit: Payer: Self-pay | Admitting: Internal Medicine

## 2023-12-09 ENCOUNTER — Other Ambulatory Visit: Payer: Self-pay | Admitting: Internal Medicine

## 2023-12-09 DIAGNOSIS — Z1231 Encounter for screening mammogram for malignant neoplasm of breast: Secondary | ICD-10-CM

## 2024-01-15 DIAGNOSIS — Z23 Encounter for immunization: Secondary | ICD-10-CM | POA: Diagnosis not present

## 2024-01-17 ENCOUNTER — Encounter: Payer: Self-pay | Admitting: Internal Medicine

## 2024-01-25 ENCOUNTER — Ambulatory Visit
Admission: RE | Admit: 2024-01-25 | Discharge: 2024-01-25 | Disposition: A | Discharge status: Home / Self Care | Source: Ambulatory Visit | Attending: Internal Medicine | Admitting: Internal Medicine

## 2024-01-25 DIAGNOSIS — Z1231 Encounter for screening mammogram for malignant neoplasm of breast: Secondary | ICD-10-CM | POA: Diagnosis not present

## 2024-02-08 DIAGNOSIS — D2262 Melanocytic nevi of left upper limb, including shoulder: Secondary | ICD-10-CM | POA: Diagnosis not present

## 2024-02-08 DIAGNOSIS — L570 Actinic keratosis: Secondary | ICD-10-CM | POA: Diagnosis not present

## 2024-02-08 DIAGNOSIS — D2261 Melanocytic nevi of right upper limb, including shoulder: Secondary | ICD-10-CM | POA: Diagnosis not present

## 2024-02-08 DIAGNOSIS — L905 Scar conditions and fibrosis of skin: Secondary | ICD-10-CM | POA: Diagnosis not present

## 2024-02-08 DIAGNOSIS — D2272 Melanocytic nevi of left lower limb, including hip: Secondary | ICD-10-CM | POA: Diagnosis not present

## 2024-02-08 DIAGNOSIS — D485 Neoplasm of uncertain behavior of skin: Secondary | ICD-10-CM | POA: Diagnosis not present

## 2024-02-08 DIAGNOSIS — D0462 Carcinoma in situ of skin of left upper limb, including shoulder: Secondary | ICD-10-CM | POA: Diagnosis not present

## 2024-02-08 DIAGNOSIS — C44619 Basal cell carcinoma of skin of left upper limb, including shoulder: Secondary | ICD-10-CM | POA: Diagnosis not present

## 2024-02-08 DIAGNOSIS — D225 Melanocytic nevi of trunk: Secondary | ICD-10-CM | POA: Diagnosis not present

## 2024-02-15 DIAGNOSIS — D0462 Carcinoma in situ of skin of left upper limb, including shoulder: Secondary | ICD-10-CM | POA: Diagnosis not present

## 2024-02-15 DIAGNOSIS — C44619 Basal cell carcinoma of skin of left upper limb, including shoulder: Secondary | ICD-10-CM | POA: Diagnosis not present

## 2024-04-29 ENCOUNTER — Encounter: Payer: Self-pay | Admitting: Internal Medicine

## 2024-04-29 ENCOUNTER — Ambulatory Visit (INDEPENDENT_AMBULATORY_CARE_PROVIDER_SITE_OTHER): Admitting: Internal Medicine

## 2024-04-29 VITALS — BP 132/74 | HR 76 | Temp 98.0°F | Ht 63.0 in | Wt 135.6 lb

## 2024-04-29 DIAGNOSIS — E785 Hyperlipidemia, unspecified: Secondary | ICD-10-CM

## 2024-04-29 DIAGNOSIS — E663 Overweight: Secondary | ICD-10-CM | POA: Diagnosis not present

## 2024-04-29 DIAGNOSIS — Z0000 Encounter for general adult medical examination without abnormal findings: Secondary | ICD-10-CM | POA: Diagnosis not present

## 2024-04-29 MED ORDER — CELECOXIB 200 MG PO CAPS: 200.0000 mg | ORAL_CAPSULE | Freq: Two times a day (BID) | ORAL | 2 refills | Status: AC

## 2024-04-29 NOTE — Progress Notes (Signed)
 Patient ID: Cynthia Ruiz, female    DOB: 15-Sep-1965  Age: 59 y.o. MRN: [id number]  The patient is here for annual preventive examination and management of other chronic and acute problems.   The risk factors are reflected in the social history.   The roster of all physicians providing medical care to patient - is listed in the Snapshot section of the chart.   Activities of daily living:  The patient is 100% independent in all ADLs: dressing, toileting, feeding as well as independent mobility   Home safety : The patient has smoke detectors in the home. They wear seatbelts.  There are no unsecured firearms at home. There is no violence in the home.    There is no risks for hepatitis, STDs or HIV. There is no   history of blood transfusion. They have no travel history to infectious disease endemic areas of the world.   The patient has seen their dentist in the last six month. They have seen their eye doctor in the last year. The patinet  denies slight hearing difficulty with regard to whispered voices and some television programs.  They have deferred audiologic testing in the last year.  They do not  have excessive sun exposure. Discussed the need for sun protection: hats, long sleeves and use of sunscreen if there is significant sun exposure.    Diet: the importance of a healthy diet is discussed. They do have a healthy diet.   The benefits of regular aerobic exercise were discussed. The patient  exercises  3 to 5 days per week  for  60 minutes.    Depression screen: there are no signs or vegative symptoms of depression- irritability, change in appetite, anhedonia, sadness/tearfullness.   The following portions of the patient's history were reviewed and updated as appropriate: allergies, current medications, past family history, past medical history,  past surgical history, past social history  and problem list.   Visual acuity was not assessed per patient preference since the patient has  regular follow up with an  ophthalmologist. Hearing and body mass index were assessed and reviewed.    During the course of the visit the patient was educated and counseled about appropriate screening and preventive services including : fall prevention , diabetes screening, nutrition counseling, colorectal cancer screening, and recommended immunizations.    Chief Complaint:  History of overweight  has lost  30 lbs intentionally  over the last 12 months with better diet and exercise.  No longer working with a trainer but still doing the exercises which include upper body using bands.   Right shoulder pain :   right trapezius muscle spasm  and triceps strain on left arm aggravated by walking briskly   2 miles daily,  starts to walk hunched over during the 2nd mile   Stress:  financial and marital stress :  daughter Vernell is full care,  husband is  dishonest and financially irresponsible  and has jeopaardized her environmental manager , and sister needs placement in skilled RN for MS but refuses to go    Constipation with hemorrhoids ,   using  benefiber daily .  Has IBS   Review of Symptoms  Patient denies headache, fevers, malaise, unintentional weight loss, skin rash, eye pain, sinus congestion and sinus pain, sore throat, dysphagia,  hemoptysis , cough, dyspnea, wheezing, chest pain, palpitations, orthopnea, edema, abdominal pain, nausea, melena, diarrhea, constipation, flank pain, dysuria, hematuria, urinary  Frequency, nocturia, numbness, tingling, seizures,  Focal weakness, Loss  of consciousness,  Tremor, insomnia, depression, anxiety, and suicidal ideation.    Physical Exam:  BP 132/74   Pulse 76   Temp 98 F (36.7 C)   Ht 5' 3 (1.6 m)   Wt 135 lb 9.6 oz (61.5 kg)   LMP 01/13/2015   SpO2 98%   BMI 24.02 kg/m    Physical Exam Vitals reviewed.  Constitutional:      General: She is not in acute distress.    Appearance: Normal appearance. She is normal weight. She is not  ill-appearing, toxic-appearing or diaphoretic.  HENT:     Head: Normocephalic.  Eyes:     General: No scleral icterus.       Right eye: No discharge.        Left eye: No discharge.     Conjunctiva/sclera: Conjunctivae normal.  Cardiovascular:     Rate and Rhythm: Normal rate and regular rhythm.     Heart sounds: Normal heart sounds.  Pulmonary:     Effort: Pulmonary effort is normal. No respiratory distress.     Breath sounds: Normal breath sounds.  Musculoskeletal:        General: Normal range of motion.  Skin:    General: Skin is warm and dry.  Neurological:     General: No focal deficit present.     Mental Status: She is alert and oriented to person, place, and time. Mental status is at baseline.  Psychiatric:        Mood and Affect: Mood normal.        Behavior: Behavior normal.        Thought Content: Thought content normal.        Judgment: Judgment normal.     Assessment and Plan: Routine general medical examination at a health care facility Assessment & Plan: age appropriate education and counseling updated, referrals for preventative services and immunizations addressed, dietary and smoking counseling addressed, most recent labs reviewed.  I have personally reviewed and have noted:   1) the patient's medical and social history 2) The pt's use of alcohol, tobacco, and illicit drugs 3) The patient's current medications and supplements 4) Functional ability including ADL's, fall risk, home safety risk, hearing and visual impairment 5) Diet and physical activities 6) Evidence for depression or mood disorder 7) The patient's height, weight, and BMI have been recorded in the chart   I have made referrals, and provided counseling and education based on review of the above    Overweight Assessment & Plan: I have congratulated her in normalizatiion of   BMI and encouraged  Continued weight loss with goal of 10% of body weigh over the next 6 months using a low glycemic  index diet and regular exercise a minimum of 5 days per week.     Hyperlipidemia LDL goal <160 Assessment & Plan: 10 yr cardiac risk is < 5%.  No treatment indicated  Lab Results  Component Value Date   CHOL 137 10/28/2023   HDL 57.50 10/28/2023   LDLCALC 67 10/28/2023   LDLDIRECT 68.0 10/28/2023   TRIG 62.0 10/28/2023   CHOLHDL 2 10/28/2023      Other orders -     Celecoxib ; Take 1 capsule (200 mg total) by mouth 2 (two) times daily.  Dispense: 60 capsule; Refill: 2    Return in about 6 months (around 10/27/2024).  Verneita LITTIE Kettering, MD

## 2024-04-29 NOTE — Patient Instructions (Signed)
 You look fantastic!  No labs needed today  PAP smear due in November   No pneumonia or shingles vaccines today; do the singles in the summer

## 2024-04-30 NOTE — Assessment & Plan Note (Signed)
 I have congratulated her in normalizatiion of   BMI and encouraged  Continued weight loss with goal of 10% of body weigh over the next 6 months using a low glycemic index diet and regular exercise a minimum of 5 days per week.

## 2024-04-30 NOTE — Assessment & Plan Note (Addendum)
 10 yr cardiac risk is < 5%.  No treatment indicated  Lab Results  Component Value Date   CHOL 137 10/28/2023   HDL 57.50 10/28/2023   LDLCALC 67 10/28/2023   LDLDIRECT 68.0 10/28/2023   TRIG 62.0 10/28/2023   CHOLHDL 2 10/28/2023

## 2024-04-30 NOTE — Assessment & Plan Note (Signed)

## 2024-06-03 ENCOUNTER — Encounter: Admitting: Internal Medicine
